# Patient Record
Sex: Female | Born: 1970
Health system: Southern US, Community
[De-identification: ages and names within clinical notes are randomized; demographics above are authoritative.]

## PROBLEM LIST (undated history)

## (undated) DIAGNOSIS — R569 Unspecified convulsions: Secondary | ICD-10-CM

## (undated) DIAGNOSIS — K219 Gastro-esophageal reflux disease without esophagitis: Secondary | ICD-10-CM

## (undated) DIAGNOSIS — F32A Depression, unspecified: Secondary | ICD-10-CM

## (undated) DIAGNOSIS — G8929 Other chronic pain: Secondary | ICD-10-CM

## (undated) DIAGNOSIS — M1990 Unspecified osteoarthritis, unspecified site: Secondary | ICD-10-CM

## (undated) DIAGNOSIS — M25511 Pain in right shoulder: Secondary | ICD-10-CM

## (undated) DIAGNOSIS — F329 Major depressive disorder, single episode, unspecified: Secondary | ICD-10-CM

## (undated) DIAGNOSIS — M542 Cervicalgia: Secondary | ICD-10-CM

## (undated) DIAGNOSIS — G4730 Sleep apnea, unspecified: Secondary | ICD-10-CM

## (undated) DIAGNOSIS — F419 Anxiety disorder, unspecified: Secondary | ICD-10-CM

## (undated) DIAGNOSIS — J3489 Other specified disorders of nose and nasal sinuses: Secondary | ICD-10-CM

## (undated) DIAGNOSIS — G43909 Migraine, unspecified, not intractable, without status migrainosus: Secondary | ICD-10-CM

## (undated) DIAGNOSIS — J45909 Unspecified asthma, uncomplicated: Secondary | ICD-10-CM

## (undated) DIAGNOSIS — M25512 Pain in left shoulder: Secondary | ICD-10-CM

## (undated) DIAGNOSIS — I639 Cerebral infarction, unspecified: Secondary | ICD-10-CM

## (undated) HISTORY — DX: Anxiety disorder, unspecified: F41.9

## (undated) HISTORY — DX: Unspecified osteoarthritis, unspecified site: M19.90

## (undated) HISTORY — DX: Unspecified asthma, uncomplicated: J45.909

## (undated) HISTORY — DX: Migraine, unspecified, not intractable, without status migrainosus: G43.909

## (undated) HISTORY — DX: Pain in right shoulder: M25.511

## (undated) HISTORY — DX: Major depressive disorder, single episode, unspecified: F32.9

## (undated) HISTORY — DX: Cervicalgia: M54.2

## (undated) HISTORY — DX: Unspecified convulsions: R56.9

## (undated) HISTORY — PX: ABDOMINAL HYSTERECTOMY: SHX81

## (undated) HISTORY — DX: Depression, unspecified: F32.A

## (undated) HISTORY — DX: Sleep apnea, unspecified: G47.30

## (undated) HISTORY — DX: Cerebral infarction, unspecified: I63.9

## (undated) HISTORY — PX: TONSILLECTOMY: SUR1361

## (undated) HISTORY — DX: Pain in left shoulder: M25.512

## (undated) HISTORY — DX: Gastro-esophageal reflux disease without esophagitis: K21.9

## (undated) HISTORY — DX: Other chronic pain: G89.29

---

## 1993-03-06 HISTORY — PX: PARTIAL HYSTERECTOMY: SHX80

## 2013-06-29 ENCOUNTER — Emergency Department: Payer: Self-pay | Admitting: Emergency Medicine

## 2013-06-30 LAB — COMPREHENSIVE METABOLIC PANEL
ANION GAP: 5 — AB (ref 7–16)
Albumin: 3.9 g/dL (ref 3.4–5.0)
Alkaline Phosphatase: 154 U/L — ABNORMAL HIGH
BUN: 8 mg/dL (ref 7–18)
Bilirubin,Total: 0.1 mg/dL — ABNORMAL LOW (ref 0.2–1.0)
CREATININE: 0.62 mg/dL (ref 0.60–1.30)
Calcium, Total: 8.9 mg/dL (ref 8.5–10.1)
Chloride: 107 mmol/L (ref 98–107)
Co2: 29 mmol/L (ref 21–32)
EGFR (African American): >60
EGFR (Non-African Amer.): >60
Glucose: 81 mg/dL (ref 65–99)
Osmolality: 279 (ref 275–301)
Potassium: 4.1 mmol/L (ref 3.5–5.1)
SGOT(AST): 21 U/L (ref 15–37)
SGPT (ALT): 16 U/L (ref 12–78)
SODIUM: 141 mmol/L (ref 136–145)
Total Protein: 7.1 g/dL (ref 6.4–8.2)

## 2013-06-30 LAB — PHENYTOIN LEVEL, TOTAL: Dilantin: 17.5 ug/mL (ref 10.0–20.0)

## 2013-06-30 LAB — URINALYSIS, COMPLETE
Bacteria: NONE SEEN
Bilirubin,UR: NEGATIVE
Blood: NEGATIVE
GLUCOSE, UR: NEGATIVE mg/dL (ref 0–75)
Ketone: NEGATIVE
LEUKOCYTE ESTERASE: NEGATIVE
NITRITE: NEGATIVE
PROTEIN: NEGATIVE
Ph: 8 (ref 4.5–8.0)
RBC,UR: NONE SEEN /HPF (ref 0–5)
SPECIFIC GRAVITY: 1.008 (ref 1.003–1.030)
Squamous Epithelial: 1
WBC UR: <1 /HPF (ref 0–5)

## 2013-06-30 LAB — DRUG SCREEN, URINE
Amphetamines, Ur Screen: NEGATIVE (ref ?–1000)
BENZODIAZEPINE, UR SCRN: POSITIVE (ref ?–200)
Barbiturates, Ur Screen: POSITIVE (ref ?–200)
Cannabinoid 50 Ng, Ur ~~LOC~~: NEGATIVE (ref ?–50)
Cocaine Metabolite,Ur ~~LOC~~: NEGATIVE (ref ?–300)
MDMA (Ecstasy)Ur Screen: NEGATIVE (ref ?–500)
Methadone, Ur Screen: NEGATIVE (ref ?–300)
Opiate, Ur Screen: NEGATIVE (ref ?–300)
PHENCYCLIDINE (PCP) UR S: NEGATIVE (ref ?–25)
Tricyclic, Ur Screen: NEGATIVE (ref ?–1000)

## 2013-06-30 LAB — CBC
HCT: 38.1 % (ref 35.0–47.0)
HGB: 13 g/dL (ref 12.0–16.0)
MCH: 30.2 pg (ref 26.0–34.0)
MCHC: 34.1 g/dL (ref 32.0–36.0)
MCV: 89 fL (ref 80–100)
Platelet: 261 10*3/uL (ref 150–440)
RBC: 4.31 10*6/uL (ref 3.80–5.20)
RDW: 12.7 % (ref 11.5–14.5)
WBC: 6.1 10*3/uL (ref 3.6–11.0)

## 2013-06-30 LAB — PHENOBARBITAL LEVEL: Phenobarbital: 15.8 ug/mL (ref 15.0–40.0)

## 2013-07-11 DIAGNOSIS — G8929 Other chronic pain: Secondary | ICD-10-CM | POA: Insufficient documentation

## 2013-08-07 ENCOUNTER — Encounter: Payer: Self-pay | Admitting: Neurology

## 2013-08-07 DIAGNOSIS — R569 Unspecified convulsions: Secondary | ICD-10-CM | POA: Insufficient documentation

## 2013-08-07 DIAGNOSIS — J449 Chronic obstructive pulmonary disease, unspecified: Secondary | ICD-10-CM

## 2013-08-07 DIAGNOSIS — G4730 Sleep apnea, unspecified: Secondary | ICD-10-CM

## 2013-08-07 DIAGNOSIS — I639 Cerebral infarction, unspecified: Secondary | ICD-10-CM

## 2013-08-07 DIAGNOSIS — J439 Emphysema, unspecified: Secondary | ICD-10-CM | POA: Insufficient documentation

## 2013-08-08 ENCOUNTER — Ambulatory Visit: Payer: Medicare Other | Admitting: Neurology

## 2013-08-16 ENCOUNTER — Emergency Department: Payer: Self-pay | Admitting: Emergency Medicine

## 2013-08-16 LAB — COMPREHENSIVE METABOLIC PANEL
ALK PHOS: 120 U/L — AB
Albumin: 3.7 g/dL (ref 3.4–5.0)
Anion Gap: 10 (ref 7–16)
BUN: 12 mg/dL (ref 7–18)
Bilirubin,Total: 0.3 mg/dL (ref 0.2–1.0)
CALCIUM: 9 mg/dL (ref 8.5–10.1)
Chloride: 109 mmol/L — ABNORMAL HIGH (ref 98–107)
Co2: 22 mmol/L (ref 21–32)
Creatinine: 0.77 mg/dL (ref 0.60–1.30)
EGFR (African American): >60
EGFR (Non-African Amer.): >60
Glucose: 92 mg/dL (ref 65–99)
Osmolality: 281 (ref 275–301)
POTASSIUM: 3.4 mmol/L — AB (ref 3.5–5.1)
SGOT(AST): 27 U/L (ref 15–37)
SGPT (ALT): 15 U/L (ref 12–78)
Sodium: 141 mmol/L (ref 136–145)
Total Protein: 7.4 g/dL (ref 6.4–8.2)

## 2013-08-16 LAB — CBC
HCT: 37.7 % (ref 35.0–47.0)
HGB: 12.8 g/dL (ref 12.0–16.0)
MCH: 29.6 pg (ref 26.0–34.0)
MCHC: 33.9 g/dL (ref 32.0–36.0)
MCV: 87 fL (ref 80–100)
PLATELETS: 290 10*3/uL (ref 150–440)
RBC: 4.32 10*6/uL (ref 3.80–5.20)
RDW: 12.6 % (ref 11.5–14.5)
WBC: 7.1 10*3/uL (ref 3.6–11.0)

## 2013-08-16 LAB — URINALYSIS, COMPLETE
Bilirubin,UR: NEGATIVE
Glucose,UR: NEGATIVE mg/dL (ref 0–75)
Ketone: NEGATIVE
LEUKOCYTE ESTERASE: NEGATIVE
Nitrite: NEGATIVE
Ph: 6 (ref 4.5–8.0)
Protein: NEGATIVE
RBC,UR: 1 /HPF (ref 0–5)
SPECIFIC GRAVITY: 1.009 (ref 1.003–1.030)
WBC UR: 1 /HPF (ref 0–5)

## 2013-08-16 LAB — PHENOBARBITAL LEVEL: Phenobarbital: <2.1 ug/mL — ABNORMAL LOW (ref 15.0–40.0)

## 2013-08-16 LAB — TROPONIN I: Troponin-I: <0.02 ng/mL

## 2013-08-16 LAB — PRO B NATRIURETIC PEPTIDE: B-Type Natriuretic Peptide: 86 pg/mL (ref 0–125)

## 2013-08-16 LAB — PHENYTOIN LEVEL, TOTAL: Dilantin: 0.6 ug/mL — ABNORMAL LOW (ref 10.0–20.0)

## 2013-09-02 ENCOUNTER — Emergency Department: Payer: Self-pay | Admitting: Emergency Medicine

## 2013-10-17 ENCOUNTER — Emergency Department: Payer: Self-pay | Admitting: Emergency Medicine

## 2013-10-17 LAB — DRUG SCREEN, URINE
Amphetamines, Ur Screen: NEGATIVE (ref ?–1000)
Barbiturates, Ur Screen: NEGATIVE (ref ?–200)
Benzodiazepine, Ur Scrn: POSITIVE (ref ?–200)
COCAINE METABOLITE, UR ~~LOC~~: NEGATIVE (ref ?–300)
Cannabinoid 50 Ng, Ur ~~LOC~~: NEGATIVE (ref ?–50)
MDMA (Ecstasy)Ur Screen: NEGATIVE (ref ?–500)
Methadone, Ur Screen: NEGATIVE (ref ?–300)
Opiate, Ur Screen: NEGATIVE (ref ?–300)
PHENCYCLIDINE (PCP) UR S: NEGATIVE (ref ?–25)
Tricyclic, Ur Screen: NEGATIVE (ref ?–1000)

## 2013-10-17 LAB — URINALYSIS, COMPLETE
BACTERIA: NONE SEEN
BLOOD: NEGATIVE
Bilirubin,UR: NEGATIVE
Glucose,UR: NEGATIVE mg/dL (ref 0–75)
Ketone: NEGATIVE
LEUKOCYTE ESTERASE: NEGATIVE
Nitrite: NEGATIVE
PH: 8 (ref 4.5–8.0)
PROTEIN: NEGATIVE
RBC,UR: 1 /HPF (ref 0–5)
Specific Gravity: 1.006 (ref 1.003–1.030)
Squamous Epithelial: 3
WBC UR: 2 /HPF (ref 0–5)

## 2013-10-17 LAB — BASIC METABOLIC PANEL
Anion Gap: 8 (ref 7–16)
BUN: 9 mg/dL (ref 7–18)
CREATININE: 0.79 mg/dL (ref 0.60–1.30)
Calcium, Total: 8.8 mg/dL (ref 8.5–10.1)
Chloride: 109 mmol/L — ABNORMAL HIGH (ref 98–107)
Co2: 22 mmol/L (ref 21–32)
EGFR (Non-African Amer.): >60
Glucose: 94 mg/dL (ref 65–99)
OSMOLALITY: 276 (ref 275–301)
POTASSIUM: 3.7 mmol/L (ref 3.5–5.1)
Sodium: 139 mmol/L (ref 136–145)

## 2013-10-17 LAB — PRO B NATRIURETIC PEPTIDE: B-Type Natriuretic Peptide: 13 pg/mL (ref 0–125)

## 2013-10-17 LAB — CBC
HCT: 40.2 % (ref 35.0–47.0)
HGB: 13.2 g/dL (ref 12.0–16.0)
MCH: 29.2 pg (ref 26.0–34.0)
MCHC: 32.7 g/dL (ref 32.0–36.0)
MCV: 89 fL (ref 80–100)
PLATELETS: 266 10*3/uL (ref 150–440)
RBC: 4.51 10*6/uL (ref 3.80–5.20)
RDW: 12.7 % (ref 11.5–14.5)
WBC: 9.2 10*3/uL (ref 3.6–11.0)

## 2013-10-29 DIAGNOSIS — M545 Low back pain, unspecified: Secondary | ICD-10-CM | POA: Insufficient documentation

## 2013-10-29 DIAGNOSIS — G40909 Epilepsy, unspecified, not intractable, without status epilepticus: Secondary | ICD-10-CM | POA: Insufficient documentation

## 2013-10-29 DIAGNOSIS — R221 Localized swelling, mass and lump, neck: Secondary | ICD-10-CM | POA: Insufficient documentation

## 2013-11-08 LAB — CBC WITH DIFFERENTIAL/PLATELET
BASOS PCT: 0.8 %
Basophil #: 0.1 10*3/uL (ref 0.0–0.1)
EOS ABS: 0.2 10*3/uL (ref 0.0–0.7)
Eosinophil %: 1.7 %
HCT: 37.1 % (ref 35.0–47.0)
HGB: 12.8 g/dL (ref 12.0–16.0)
LYMPHS PCT: 24.9 %
Lymphocyte #: 2.5 10*3/uL (ref 1.0–3.6)
MCH: 30.2 pg (ref 26.0–34.0)
MCHC: 34.7 g/dL (ref 32.0–36.0)
MCV: 87 fL (ref 80–100)
MONO ABS: 0.8 x10 3/mm (ref 0.2–0.9)
Monocyte %: 8.2 %
NEUTROS ABS: 6.6 10*3/uL — AB (ref 1.4–6.5)
NEUTROS PCT: 64.4 %
Platelet: 296 10*3/uL (ref 150–440)
RBC: 4.25 10*6/uL (ref 3.80–5.20)
RDW: 12.7 % (ref 11.5–14.5)
WBC: 10.2 10*3/uL (ref 3.6–11.0)

## 2013-11-08 LAB — COMPREHENSIVE METABOLIC PANEL
ALBUMIN: 3.3 g/dL — AB (ref 3.4–5.0)
ALT: 31 U/L
Alkaline Phosphatase: 154 U/L — ABNORMAL HIGH
Anion Gap: 11 (ref 7–16)
BILIRUBIN TOTAL: 0.2 mg/dL (ref 0.2–1.0)
BUN: 14 mg/dL (ref 7–18)
CALCIUM: 7.8 mg/dL — AB (ref 8.5–10.1)
CHLORIDE: 110 mmol/L — AB (ref 98–107)
CREATININE: 0.77 mg/dL (ref 0.60–1.30)
Co2: 20 mmol/L — ABNORMAL LOW (ref 21–32)
GLUCOSE: 115 mg/dL — AB (ref 65–99)
OSMOLALITY: 283 (ref 275–301)
Potassium: 3.5 mmol/L (ref 3.5–5.1)
SGOT(AST): 36 U/L (ref 15–37)
Sodium: 141 mmol/L (ref 136–145)
TOTAL PROTEIN: 7 g/dL (ref 6.4–8.2)

## 2013-11-09 ENCOUNTER — Ambulatory Visit: Payer: Self-pay | Admitting: Neurology

## 2013-11-09 ENCOUNTER — Observation Stay: Payer: Self-pay | Admitting: Internal Medicine

## 2013-11-09 LAB — URINALYSIS, COMPLETE
Bilirubin,UR: NEGATIVE
Blood: NEGATIVE
GLUCOSE, UR: NEGATIVE mg/dL (ref 0–75)
Ketone: NEGATIVE
LEUKOCYTE ESTERASE: NEGATIVE
Nitrite: NEGATIVE
Ph: 7 (ref 4.5–8.0)
Protein: NEGATIVE
RBC,UR: 1 /HPF (ref 0–5)
Specific Gravity: 1.008 (ref 1.003–1.030)
Squamous Epithelial: 1
WBC UR: 3 /HPF (ref 0–5)

## 2013-11-10 LAB — BASIC METABOLIC PANEL
Anion Gap: 7 (ref 7–16)
BUN: 13 mg/dL (ref 7–18)
Calcium, Total: 8.6 mg/dL (ref 8.5–10.1)
Chloride: 110 mmol/L — ABNORMAL HIGH (ref 98–107)
Co2: 23 mmol/L (ref 21–32)
Creatinine: 0.83 mg/dL (ref 0.60–1.30)
EGFR (African American): >60
EGFR (Non-African Amer.): >60
Glucose: 106 mg/dL — ABNORMAL HIGH (ref 65–99)
Osmolality: 280 (ref 275–301)
Potassium: 3.8 mmol/L (ref 3.5–5.1)
Sodium: 140 mmol/L (ref 136–145)

## 2013-11-10 LAB — CBC WITH DIFFERENTIAL/PLATELET
Basophil #: 0.1 10*3/uL (ref 0.0–0.1)
Basophil %: 0.7 %
Eosinophil #: 0.2 10*3/uL (ref 0.0–0.7)
Eosinophil %: 2.9 %
HCT: 36.2 % (ref 35.0–47.0)
HGB: 11.8 g/dL — ABNORMAL LOW (ref 12.0–16.0)
Lymphocyte #: 2.3 10*3/uL (ref 1.0–3.6)
Lymphocyte %: 31.3 %
MCH: 28.9 pg (ref 26.0–34.0)
MCHC: 32.6 g/dL (ref 32.0–36.0)
MCV: 89 fL (ref 80–100)
Monocyte #: 0.6 x10 3/mm (ref 0.2–0.9)
Monocyte %: 7.9 %
Neutrophil #: 4.3 10*3/uL (ref 1.4–6.5)
Neutrophil %: 57.2 %
Platelet: 246 10*3/uL (ref 150–440)
RBC: 4.08 10*6/uL (ref 3.80–5.20)
RDW: 12.6 % (ref 11.5–14.5)
WBC: 7.5 10*3/uL (ref 3.6–11.0)

## 2013-11-10 LAB — TSH: Thyroid Stimulating Horm: 1.32 u[IU]/mL

## 2013-11-12 LAB — COMPREHENSIVE METABOLIC PANEL
ALBUMIN: 3.1 g/dL — AB (ref 3.4–5.0)
ALK PHOS: 149 U/L — AB
Anion Gap: 10 (ref 7–16)
BUN: 15 mg/dL (ref 7–18)
Bilirubin,Total: 0.1 mg/dL — ABNORMAL LOW (ref 0.2–1.0)
CHLORIDE: 108 mmol/L — AB (ref 98–107)
CREATININE: 0.82 mg/dL (ref 0.60–1.30)
Calcium, Total: 8.4 mg/dL — ABNORMAL LOW (ref 8.5–10.1)
Co2: 20 mmol/L — ABNORMAL LOW (ref 21–32)
EGFR (African American): >60
GLUCOSE: 141 mg/dL — AB (ref 65–99)
Osmolality: 279 (ref 275–301)
POTASSIUM: 3.6 mmol/L (ref 3.5–5.1)
SGOT(AST): 37 U/L (ref 15–37)
SGPT (ALT): 45 U/L
SODIUM: 138 mmol/L (ref 136–145)
Total Protein: 6.4 g/dL (ref 6.4–8.2)

## 2013-11-12 LAB — CBC WITH DIFFERENTIAL/PLATELET
BASOS ABS: 0.1 10*3/uL (ref 0.0–0.1)
Basophil %: 0.7 %
EOS PCT: 3.2 %
Eosinophil #: 0.3 10*3/uL (ref 0.0–0.7)
HCT: 35.6 % (ref 35.0–47.0)
HGB: 12 g/dL (ref 12.0–16.0)
LYMPHS PCT: 25.1 %
Lymphocyte #: 2.2 10*3/uL (ref 1.0–3.6)
MCH: 29.6 pg (ref 26.0–34.0)
MCHC: 33.8 g/dL (ref 32.0–36.0)
MCV: 88 fL (ref 80–100)
MONO ABS: 0.7 x10 3/mm (ref 0.2–0.9)
Monocyte %: 8.2 %
Neutrophil #: 5.5 10*3/uL (ref 1.4–6.5)
Neutrophil %: 62.8 %
Platelet: 252 10*3/uL (ref 150–440)
RBC: 4.06 10*6/uL (ref 3.80–5.20)
RDW: 12.3 % (ref 11.5–14.5)
WBC: 8.7 10*3/uL (ref 3.6–11.0)

## 2013-11-17 ENCOUNTER — Emergency Department: Payer: Self-pay | Admitting: Emergency Medicine

## 2013-11-17 LAB — CBC
HCT: 33.7 % — AB (ref 35.0–47.0)
HGB: 11.2 g/dL — ABNORMAL LOW (ref 12.0–16.0)
MCH: 29.3 pg (ref 26.0–34.0)
MCHC: 33.1 g/dL (ref 32.0–36.0)
MCV: 89 fL (ref 80–100)
Platelet: 246 10*3/uL (ref 150–440)
RBC: 3.8 10*6/uL (ref 3.80–5.20)
RDW: 12.8 % (ref 11.5–14.5)
WBC: 6.7 10*3/uL (ref 3.6–11.0)

## 2013-11-17 LAB — COMPREHENSIVE METABOLIC PANEL
ALBUMIN: 2.9 g/dL — AB (ref 3.4–5.0)
ANION GAP: 9 (ref 7–16)
AST: 30 U/L (ref 15–37)
Alkaline Phosphatase: 142 U/L — ABNORMAL HIGH
BUN: 11 mg/dL (ref 7–18)
Bilirubin,Total: 0.1 mg/dL — ABNORMAL LOW (ref 0.2–1.0)
CALCIUM: 8.1 mg/dL — AB (ref 8.5–10.1)
Chloride: 111 mmol/L — ABNORMAL HIGH (ref 98–107)
Co2: 22 mmol/L (ref 21–32)
Creatinine: 0.67 mg/dL (ref 0.60–1.30)
EGFR (African American): >60
EGFR (Non-African Amer.): >60
GLUCOSE: 90 mg/dL (ref 65–99)
Osmolality: 282 (ref 275–301)
POTASSIUM: 4.1 mmol/L (ref 3.5–5.1)
SGPT (ALT): 28 U/L
Sodium: 142 mmol/L (ref 136–145)
Total Protein: 6.6 g/dL (ref 6.4–8.2)

## 2013-11-17 LAB — HCG, QUANTITATIVE, PREGNANCY: Beta Hcg, Quant.: 1 m[IU]/mL

## 2013-11-17 LAB — TROPONIN I

## 2013-11-27 DIAGNOSIS — IMO0002 Reserved for concepts with insufficient information to code with codable children: Secondary | ICD-10-CM | POA: Insufficient documentation

## 2013-12-02 DIAGNOSIS — R519 Headache, unspecified: Secondary | ICD-10-CM | POA: Insufficient documentation

## 2013-12-02 DIAGNOSIS — R51 Headache: Secondary | ICD-10-CM

## 2013-12-25 ENCOUNTER — Ambulatory Visit: Payer: Self-pay | Admitting: Pain Medicine

## 2014-01-07 DIAGNOSIS — F419 Anxiety disorder, unspecified: Secondary | ICD-10-CM | POA: Insufficient documentation

## 2014-01-07 DIAGNOSIS — F32A Depression, unspecified: Secondary | ICD-10-CM | POA: Insufficient documentation

## 2014-01-07 DIAGNOSIS — F329 Major depressive disorder, single episode, unspecified: Secondary | ICD-10-CM | POA: Insufficient documentation

## 2014-01-13 ENCOUNTER — Ambulatory Visit: Payer: Self-pay | Admitting: Nurse Practitioner

## 2014-04-13 DIAGNOSIS — IMO0001 Reserved for inherently not codable concepts without codable children: Secondary | ICD-10-CM | POA: Insufficient documentation

## 2014-04-13 DIAGNOSIS — R51 Headache: Secondary | ICD-10-CM | POA: Diagnosis not present

## 2014-04-13 DIAGNOSIS — R6889 Other general symptoms and signs: Secondary | ICD-10-CM | POA: Diagnosis not present

## 2014-04-13 DIAGNOSIS — R519 Headache, unspecified: Secondary | ICD-10-CM | POA: Insufficient documentation

## 2014-05-14 DIAGNOSIS — F329 Major depressive disorder, single episode, unspecified: Secondary | ICD-10-CM | POA: Insufficient documentation

## 2014-05-14 DIAGNOSIS — F32A Depression, unspecified: Secondary | ICD-10-CM | POA: Insufficient documentation

## 2014-05-14 DIAGNOSIS — R6889 Other general symptoms and signs: Secondary | ICD-10-CM | POA: Diagnosis not present

## 2014-05-14 DIAGNOSIS — R51 Headache: Secondary | ICD-10-CM | POA: Diagnosis not present

## 2014-05-15 ENCOUNTER — Encounter (HOSPITAL_COMMUNITY): Payer: Self-pay | Admitting: *Deleted

## 2014-05-15 ENCOUNTER — Inpatient Hospital Stay (HOSPITAL_COMMUNITY)
Admission: AD | Admit: 2014-05-15 | Discharge: 2014-05-17 | DRG: 100 | Disposition: A | Discharge status: Home / Self Care | Payer: Commercial Managed Care - HMO | Source: Other Acute Inpatient Hospital | Attending: Neurology | Admitting: Neurology

## 2014-05-15 ENCOUNTER — Emergency Department: Payer: Self-pay | Admitting: Emergency Medicine

## 2014-05-15 ENCOUNTER — Inpatient Hospital Stay (HOSPITAL_COMMUNITY): Payer: Commercial Managed Care - HMO

## 2014-05-15 DIAGNOSIS — G40901 Epilepsy, unspecified, not intractable, with status epilepticus: Secondary | ICD-10-CM | POA: Diagnosis not present

## 2014-05-15 DIAGNOSIS — G4730 Sleep apnea, unspecified: Secondary | ICD-10-CM | POA: Diagnosis present

## 2014-05-15 DIAGNOSIS — Z880 Allergy status to penicillin: Secondary | ICD-10-CM | POA: Diagnosis not present

## 2014-05-15 DIAGNOSIS — R569 Unspecified convulsions: Secondary | ICD-10-CM | POA: Diagnosis not present

## 2014-05-15 DIAGNOSIS — F329 Major depressive disorder, single episode, unspecified: Secondary | ICD-10-CM | POA: Diagnosis present

## 2014-05-15 DIAGNOSIS — Z7982 Long term (current) use of aspirin: Secondary | ICD-10-CM | POA: Diagnosis not present

## 2014-05-15 DIAGNOSIS — J9600 Acute respiratory failure, unspecified whether with hypoxia or hypercapnia: Secondary | ICD-10-CM | POA: Diagnosis present

## 2014-05-15 DIAGNOSIS — Z79899 Other long term (current) drug therapy: Secondary | ICD-10-CM | POA: Diagnosis not present

## 2014-05-15 DIAGNOSIS — F419 Anxiety disorder, unspecified: Secondary | ICD-10-CM | POA: Diagnosis not present

## 2014-05-15 DIAGNOSIS — G40301 Generalized idiopathic epilepsy and epileptic syndromes, not intractable, with status epilepticus: Secondary | ICD-10-CM

## 2014-05-15 DIAGNOSIS — Z0189 Encounter for other specified special examinations: Secondary | ICD-10-CM

## 2014-05-15 DIAGNOSIS — R401 Stupor: Secondary | ICD-10-CM | POA: Diagnosis not present

## 2014-05-15 DIAGNOSIS — J449 Chronic obstructive pulmonary disease, unspecified: Secondary | ICD-10-CM | POA: Diagnosis present

## 2014-05-15 DIAGNOSIS — Z791 Long term (current) use of non-steroidal anti-inflammatories (NSAID): Secondary | ICD-10-CM | POA: Diagnosis not present

## 2014-05-15 DIAGNOSIS — M5032 Other cervical disc degeneration, mid-cervical region: Secondary | ICD-10-CM | POA: Diagnosis not present

## 2014-05-15 DIAGNOSIS — Z8673 Personal history of transient ischemic attack (TIA), and cerebral infarction without residual deficits: Secondary | ICD-10-CM | POA: Diagnosis not present

## 2014-05-15 DIAGNOSIS — Z7951 Long term (current) use of inhaled steroids: Secondary | ICD-10-CM | POA: Diagnosis not present

## 2014-05-15 DIAGNOSIS — J9601 Acute respiratory failure with hypoxia: Secondary | ICD-10-CM

## 2014-05-15 DIAGNOSIS — Z720 Tobacco use: Secondary | ICD-10-CM | POA: Diagnosis not present

## 2014-05-15 DIAGNOSIS — S0990XA Unspecified injury of head, initial encounter: Secondary | ICD-10-CM | POA: Diagnosis not present

## 2014-05-15 DIAGNOSIS — Z4682 Encounter for fitting and adjustment of non-vascular catheter: Secondary | ICD-10-CM | POA: Diagnosis not present

## 2014-05-15 LAB — RAPID URINE DRUG SCREEN, HOSP PERFORMED
AMPHETAMINES: NOT DETECTED
BARBITURATES: NOT DETECTED
Benzodiazepines: POSITIVE — AB
COCAINE: NOT DETECTED
Opiates: NOT DETECTED
Tetrahydrocannabinol: NOT DETECTED

## 2014-05-15 LAB — MRSA PCR SCREENING: MRSA BY PCR: NEGATIVE

## 2014-05-15 LAB — TRIGLYCERIDES: TRIGLYCERIDES: 194 mg/dL — AB (ref <150)

## 2014-05-15 MED ORDER — PROPOFOL 10 MG/ML IV EMUL
0.0000 ug/kg/min | INTRAVENOUS | Status: DC
  Administered 2014-05-15: 30 ug/kg/min via INTRAVENOUS
  Administered 2014-05-15: 40 ug/kg/min via INTRAVENOUS
  Administered 2014-05-16: 50 ug/kg/min via INTRAVENOUS
  Administered 2014-05-16: 45 ug/kg/min via INTRAVENOUS
  Filled 2014-05-15 (×4): qty 100

## 2014-05-15 MED ORDER — PANTOPRAZOLE SODIUM 40 MG IV SOLR
40.0000 mg | Freq: Every day | INTRAVENOUS | Status: DC
  Administered 2014-05-15: 40 mg via INTRAVENOUS
  Filled 2014-05-15 (×2): qty 40

## 2014-05-15 MED ORDER — LORAZEPAM 2 MG/ML IJ SOLN: 2.0000 mg | INTRAMUSCULAR | Status: DC | PRN

## 2014-05-15 MED ORDER — CETYLPYRIDINIUM CHLORIDE 0.05 % MT LIQD
7.0000 mL | Freq: Four times a day (QID) | OROMUCOSAL | Status: DC
  Administered 2014-05-16 (×2): 7 mL via OROMUCOSAL

## 2014-05-15 MED ORDER — LORAZEPAM 2 MG/ML IJ SOLN
INTRAMUSCULAR | Status: AC
Start: 2014-05-15 — End: 2014-05-15
  Administered 2014-05-15: 1 mg
  Filled 2014-05-15: qty 1

## 2014-05-15 MED ORDER — PROPOFOL 10 MG/ML IV EMUL
0.0000 ug/kg/min | INTRAVENOUS | Status: DC
  Administered 2014-05-15: 30 ug/kg/min via INTRAVENOUS

## 2014-05-15 MED ORDER — LACOSAMIDE 200 MG/20ML IV SOLN
100.0000 mg | Freq: Two times a day (BID) | INTRAVENOUS | Status: DC
  Administered 2014-05-16 – 2014-05-17 (×3): 100 mg via INTRAVENOUS
  Filled 2014-05-15 (×7): qty 10

## 2014-05-15 MED ORDER — LEVETIRACETAM IN NACL 750 MG/50ML IV SOLN
750.0000 mg | Freq: Two times a day (BID) | INTRAVENOUS | Status: DC
  Administered 2014-05-15 – 2014-05-17 (×4): 750 mg via INTRAVENOUS
  Filled 2014-05-15 (×7): qty 50

## 2014-05-15 MED ORDER — CHLORHEXIDINE GLUCONATE 0.12 % MT SOLN
15.0000 mL | Freq: Two times a day (BID) | OROMUCOSAL | Status: DC
Start: 2014-05-15 — End: 2014-05-16
  Administered 2014-05-15: 15 mL via OROMUCOSAL
  Filled 2014-05-15: qty 15

## 2014-05-15 MED ORDER — FENTANYL CITRATE 0.05 MG/ML IJ SOLN: 50.0000 ug | INTRAMUSCULAR | Status: DC | PRN

## 2014-05-15 NOTE — Consult Note (Signed)
PULMONARY / CRITICAL CARE MEDICINE   Name: [name] MRN: [id number] DOB: 1970-09-02    ADMISSION DATE:  05/15/2014 CONSULTATION DATE:  3/11  REFERRING MD : Donzetta Kohut, Neuro  CHIEF COMPLAINT:  seizures  INITIAL PRESENTATION: 44 year old with known seizure disorder, transferred from [facility] ED where she presented with multiple seizures requiring intubation. There was some concern that this could be psychogenic seizures. She was given Ativan, loaded with Vimpat, intubated and transferred She was awake and responsive on arrival to 3100, but then had shaking of both upper extremities suggesting seizure, hence was sedated with propofol  STUDIES:  3/11 head CT negative 3/11 CT cervical spine negative, showed parathyroid adenoma  SIGNIFICANT EVENTS:     PAST MEDICAL HISTORY :   has a past medical history of Anxiety; Arthritis; Asthma; Migraines; Depression; Emphysema/COPD; Sleep apnea; Stroke; and Seizure.  has past surgical history that includes Cesarean section (1993) and Partial hysterectomy (1995). Prior to Admission medications   Medication Sig Start Date End Date Taking? Authorizing Provider  albuterol (PROVENTIL) (2.5 MG/3ML) 0.083% nebulizer solution Take 2.5 mg by nebulization every 6 (six) hours as needed for wheezing or shortness of breath.    Historical Provider, MD  butalbital-acetaminophen-caffeine (FIORICET WITH CODEINE) 50-325-40-30 MG per capsule Take 1 capsule by mouth every 4 (four) hours as needed for headache.    Historical Provider, MD  diazepam (VALIUM) 10 MG tablet Take 10 mg by mouth every 6 (six) hours as needed for anxiety.    Historical Provider, MD  FLUoxetine (PROZAC) 40 MG capsule Take 40 mg by mouth daily.    Historical Provider, MD  Gabapentin, PHN, 300 MG TABS Take 300 mg by mouth daily.    Historical Provider, MD  Gabapentin, PHN, 600 MG TABS Take by mouth daily.    Historical Provider, MD  meloxicam (MOBIC) 7.5 MG tablet Take 7.5 mg by mouth 2  (two) times daily.    Historical Provider, MD  phenazopyridine (PYRIDIUM) 100 MG tablet Take 100 mg by mouth 3 (three) times daily as needed for pain.    Historical Provider, MD  PHENobarbital (LUMINAL) 32.4 MG tablet Take 32.4 mg by mouth at bedtime.    Historical Provider, MD  phenytoin (DILANTIN) 100 MG ER capsule Take 100 mg by mouth 3 (three) times daily.    Historical Provider, MD  promethazine (PHENERGAN) 25 MG tablet Take 25 mg by mouth every 6 (six) hours as needed for nausea or vomiting.    Historical Provider, MD   Allergies  Allergen Reactions  . Contrast Media [Iodinated Diagnostic Agents]   . Penicillins     FAMILY HISTORY:  has no family status information on file.  SOCIAL HISTORY:  reports that she has never smoked. She has never used smokeless tobacco. She reports that she does not drink alcohol or use illicit drugs.  REVIEW OF SYSTEMS:  Unable to obtain  SUBJECTIVE:   VITAL SIGNS: FiO2 (%):  [30 %] 30 % (03/11 1700) HEMODYNAMICS:   VENTILATOR SETTINGS: Vent Mode:  [-] PRVC FiO2 (%):  [30 %] 30 % Set Rate:  [14 bmp] 14 bmp Vt Set:  [550 mL] 550 mL PEEP:  [5 cmH20] 5 cmH20 Plateau Pressure:  [15 cmH20] 15 cmH20 INTAKE / OUTPUT: No intake or output data in the 24 hours ending 05/15/14 1752  PHYSICAL EXAMINATION: General:  Acutely ill, sedated on propofol Neuro:  Sedated, unresponsive, pupils 3 mm reactive to light HEENT:  No JVD Cardiovascular:  S1 and S2 tachycardia Lungs:  Clear Abdomen:  Soft and nontender Musculoskeletal:  No edema Skin:  Intact  LABS: From [place] reviewed  CBC No results for input(s): WBC, HGB, HCT, PLT in the last 168 hours. Coag's No results for input(s): APTT, INR in the last 168 hours. BMET No results for input(s): NA, K, CL, CO2, BUN, CREATININE, GLUCOSE in the last 168 hours. Electrolytes No results for input(s): CALCIUM, MG, PHOS in the last 168 hours. Sepsis Markers No results for input(s): LATICACIDVEN,  PROCALCITON, O2SATVEN in the last 168 hours. ABG No results for input(s): PHART, PCO2ART, PO2ART in the last 168 hours. Liver Enzymes No results for input(s): AST, ALT, ALKPHOS, BILITOT, ALBUMIN in the last 168 hours. Cardiac Enzymes No results for input(s): TROPONINI, PROBNP in the last 168 hours. Glucose No results for input(s): GLUCAP in the last 168 hours.  Imaging No results found.   ASSESSMENT / PLAN:  PULMONARY OETT 3/11>> A: Acute respiratory failure, intubated for airway protection P:   Vent bundle SBTs with goal of extubation  CARDIOVASCULAR  A: no issues P:    RENAL A:  No issues P:   NS @ 50/h  GASTROINTESTINAL A:  No issues P:   protonix for SUP  HEMATOLOGIC A:  No issues P:    ENDOCRINE A:  No issues   P:   CBG while npo  NEUROLOGIC A:  Status epilepticus vs psychogenic seizures P:   RASS goal: 0 Propofol gtt/ fent prn Await eeg, if neg-can proceed with extubation in am   FAMILY  - Updates: husband 3/11  - Inter-disciplinary family meet or Palliative Care meeting due by: NA    TODAY'S SUMMARY: Status vs psychogenic seizures, await EEG  Care during the described time interval was provided by me and/or other providers on the critical care team.  I have reviewed this patient's available data, including medical history, events of note, physical examination and test results as part of my evaluation  CC time x 84m   Laquesha Holcomb V. MD  05/15/2014, 5:52 PM

## 2014-05-15 NOTE — Consult Note (Signed)
NEURO HOSPITALIST CONSULT NOTE    Reason for Consult: status epilepticus  HPI:                                                                                                                                          [name] is an 44 y.o. female with a past medical history significant for migraines, depression, anxiety, stroke, and seizures, transferred to [facility] for further evaluation and management of status epilepticus. Patient reportedly had multiple seizures that were not aborted with benzodiazepines, was loaded with vimpat, intubated and transferred to [facility]. CT brain performed at [facility] showed no acute abnormality. She is now intubated on propofol, but with open eyes and able to follow commands. As per ED attending, the pattern of witnessed seizures were concerning for non epileptic seizures. When I first saw patient the NICU, she was alert and awake and we considered extubation when suddenly she started jerking movement of her arms with decreased responsiveness.   Past Medical History  Diagnosis Date  . Anxiety   . Arthritis   . Asthma   . Migraines   . Depression   . Emphysema/COPD   . Sleep apnea   . Stroke   . Seizure     Past Surgical History  Procedure Laterality Date  . Cesarean section  1993  . Partial hysterectomy  1995    No family history on file.  Family History: unable to obtain due to mental status   Social History:  reports that she has never smoked. She has never used smokeless tobacco. She reports that she does not drink alcohol or use illicit drugs.  Allergies  Allergen Reactions  . Contrast Media [Iodinated Diagnostic Agents]   . Penicillins     MEDICATIONS:                                                                                                                     Scheduled: . [START ON 05/16/2014] lacosamide (VIMPAT) IV  100 mg Intravenous Q12H  . levETIRAcetam  750 mg Intravenous Q12H  . pantoprazole  (PROTONIX) IV  40 mg Intravenous Q24H     ROS: unable to obtain due to mental status  History obtained from chart review    There were no vitals taken for this visit.    Physical exam: sedated with propofol, intubated on the vent Head: normocephalic. Neck: supple, no bruits, no JVD. Cardiac: no murmurs. Lungs: clear. Abdomen: soft, no tender, no mass. Extremities: no edema. Skin: no rash Neurologic Examination:                                                                                                      General: Mental Status: She is sedated with propofol, intubated on the vent, but initially was able to follow commands. Cranial Nerves: II: Discs flat bilaterally; Visual fields grossly normal, pupils equal, round, reactive to light III,IV, VI: ptosis not present, extra-ocular motions intact bilaterally V,VII: smile symmetric, facial light touch sensation normal bilaterally VIII: hearing normal bilaterally IX,X: uvula rises symmetrically XI: bilateral shoulder shrug no tested XII: midline tongue extension without atrophy or fasciculations Motor: Right : Upper extremity   5/5    Left:     Upper extremity   5/5  Lower extremity   5/5     Lower extremity   5/5 Tone and bulk:normal tone throughout; no atrophy noted Sensory: reacts to pain Deep Tendon Reflexes:  Right: Upper Extremity   Left: Upper extremity   biceps (C-5 to C-6) 2/4   biceps (C-5 to C-6) 2/4 tricep (C7) 2/4    triceps (C7) 2/4 Brachioradialis (C6) 2/4  Brachioradialis (C6) 2/4  Lower Extremity Lower Extremity  quadriceps (L-2 to L-4) 2/4   quadriceps (L-2 to L-4) 2/4 Achilles (S1) 2/4   Achilles (S1) 2/4  Plantars: Right: downgoing   Left: downgoing Cerebellar: Unable to test Gait:  Unable to test    No results found for: CHOL  No results found for this or  any previous visit (from the past 40 hour(s)).  No results found.  Assessment/Plan: 44 y/o with  migraines, depression, anxiety, stroke, and seizures, transferred to [facility] for further evaluation and management of GTC status epilepticus.  Patient transferred to [facility] with concerns of psychogenic status. Will continue vimpat and Keppra. EEG. Will continue to follow.  Dorian Pod, MD 05/15/2014, 5:47 PM

## 2014-05-15 NOTE — Progress Notes (Signed)
Continuous EEG started.

## 2014-05-16 ENCOUNTER — Inpatient Hospital Stay (HOSPITAL_COMMUNITY): Payer: Commercial Managed Care - HMO

## 2014-05-16 DIAGNOSIS — J9600 Acute respiratory failure, unspecified whether with hypoxia or hypercapnia: Secondary | ICD-10-CM | POA: Diagnosis present

## 2014-05-16 MED ORDER — ONDANSETRON HCL 4 MG/2ML IJ SOLN
4.0000 mg | Freq: Once | INTRAMUSCULAR | Status: AC
  Administered 2014-05-16: 4 mg via INTRAVENOUS
  Filled 2014-05-16: qty 2

## 2014-05-16 MED ORDER — KCL IN DEXTROSE-NACL 20-5-0.45 MEQ/L-%-% IV SOLN
INTRAVENOUS | Status: DC
  Administered 2014-05-16: 50 mL/h via INTRAVENOUS
  Administered 2014-05-17: 06:00:00 via INTRAVENOUS
  Filled 2014-05-16 (×3): qty 1000

## 2014-05-16 MED ORDER — KETOROLAC TROMETHAMINE 30 MG/ML IJ SOLN
30.0000 mg | Freq: Four times a day (QID) | INTRAMUSCULAR | Status: DC | PRN
  Administered 2014-05-16 – 2014-05-17 (×2): 30 mg via INTRAVENOUS
  Filled 2014-05-16 (×2): qty 1

## 2014-05-16 MED ORDER — PROMETHAZINE HCL 25 MG/ML IJ SOLN
12.5000 mg | Freq: Once | INTRAMUSCULAR | Status: AC
  Administered 2014-05-16: 12.5 mg via INTRAVENOUS
  Filled 2014-05-16: qty 1

## 2014-05-16 MED ORDER — KETOROLAC TROMETHAMINE 30 MG/ML IJ SOLN
30.0000 mg | Freq: Once | INTRAMUSCULAR | Status: AC
  Administered 2014-05-16: 30 mg via INTRAVENOUS
  Filled 2014-05-16: qty 1

## 2014-05-16 MED ORDER — ACETAMINOPHEN 325 MG PO TABS
650.0000 mg | ORAL_TABLET | ORAL | Status: DC | PRN
  Administered 2014-05-16: 650 mg via ORAL
  Filled 2014-05-16: qty 2

## 2014-05-16 MED ORDER — METHOCARBAMOL 500 MG PO TABS: 500.0000 mg | ORAL_TABLET | Freq: Four times a day (QID) | ORAL | Status: DC | PRN

## 2014-05-16 NOTE — Procedures (Signed)
  Electroencephalogram report- LTM   Ordering Physician : Dr.Camilo  EEG number: [id number]    Data acquisition: 10-20 electrode placement.  Additional T1, T2, and EKG electrodes; 26 channel digital referential acquisition reformatted to 18 channel/7 channel coronal bipolar      Beginning time: 05/15/14 Ending time:05/16/14  Day of study: day 1    This 24 hours of intensive EEG monitoring with simultaneous video monitoring was performed for this patient with spells of shaking suspected seizures , she is now intubated, sedated with propofol Medications: propofol , vimpat, keppra  There was no pushbutton activations events during this recording noted on EEG tech comments    The  background activities were marked by continuous background activities throughout the recording. During wakefulness broadly distributed 8-9 cps rhythm was present with posterior dominance. Unable to comment on reactivity as formal testing were not performed.  Occasional busts of broadly distributed  theta slowing were also present  There was sleep wake cycle present marked by state changes on EEG.  During sleep the background activities were marked by alternating theta and runs of delta slowing  There was no epileptiform discharges clinical or subclinical seizures present.    Clinical interpretation: This 24 hours of intensive EEG monitoring with simultaneous monitoring did not record any clinical or subclinical seizures.  Background activities were marked by continuous background activities with state changes. Mild disorganization and intrusions of theta slowing into waking background activities might be related sedation. There was no epileptiform discharges clinical or subclinical seizures present.  Clinical correlation is advised .

## 2014-05-16 NOTE — Progress Notes (Signed)
NEURO HOSPITALIST PROGRESS NOTE   SUBJECTIVE:                                                                                                                        Patient hasn't had further seizures since admitted to the NICU. Intubated on the vent, off propofol for the past 20 minutes. Did not have seizures last night but during this assessment she sustained a 15 seconds episode characterize by tonic clonic movements upper extremities with eyes close and unresponsiveness but without concomitant EEG correlate. As a matter of fact, c-EEG monitoring since admission hasn't revealed frank epileptiform discharges or post ictal slowing despite multiple seizures few hours before transferred to [facility]. She is on vimpat and keppra.  OBJECTIVE:                                                                                                                           Vital signs in last 24 hours: Temp:  [97.7 F (36.5 C)-98.7 F (37.1 C)] 98.1 F (36.7 C) (03/12 0730) Pulse Rate:  [75-125] 95 (03/12 0721) Resp:  [14-23] 23 (03/12 0721) BP: (83-121)/(61-90) 96/65 mmHg (03/12 0721) SpO2:  [100 %] 100 % (03/12 0700) FiO2 (%):  [30 %-40 %] 40 % (03/12 0721) Weight:  [63.866 kg (140 lb 12.8 oz)] 63.866 kg (140 lb 12.8 oz) (03/11 1825)  Intake/Output from previous day: 03/11 0701 - 03/12 0700 In: 287.7 [I.V.:187.7; IV Piggyback:100] Out: 665 [Urine:665] Intake/Output this shift:   Nutritional status: Diet NPO time specified  Past Medical History  Diagnosis Date  . Anxiety   . Arthritis   . Asthma   . Migraines   . Depression   . Emphysema/COPD   . Sleep apnea   . Stroke   . Seizure     Physical exam:intubated on the vent, off propofol. Head: normocephalic. Neck: supple, no bruits, no JVD. Cardiac: no murmurs. Lungs: clear. Abdomen: soft, no tender, no mass. Extremities: no edema. Skin: no rash   Neurologic Exam:  Mental Status: Intubated on  the vent, off sedation, able to follow commands. Cranial Nerves: II: Discs flat bilaterally; Visual fields grossly normal, pupils equal, round, reactive to light III,IV, VI: ptosis not present, extra-ocular motions intact bilaterally V,VII:  smile symmetric, facial light touch sensation normal bilaterally VIII: hearing normal bilaterally IX,X: uvula rises symmetrically XI: bilateral shoulder shrug no tested XII: midline tongue extension without atrophy or fasciculations Motor: Right :Upper extremity 5/5Left: Upper extremity 5/5 Lower extremity 5/5Lower extremity 5/5 Tone and bulk:normal tone throughout; no atrophy noted Sensory: reacts to pain Deep Tendon Reflexes:  Right: Upper Extremity Left: Upper extremity   biceps (C-5 to C-6) 2/4 biceps (C-5 to C-6) 2/4 tricep (C7) 2/4triceps (C7) 2/4 Brachioradialis (C6) 2/4Brachioradialis (C6) 2/4  Lower Extremity Lower Extremity  quadriceps (L-2 to L-4) 2/4 quadriceps (L-2 to L-4) 2/4 Achilles (S1) 2/4Achilles (S1) 2/4  Plantars: Right: downgoingLeft: downgoing Cerebellar: Unable to test Gait:  Unable to test  Lab Results: No results found for: CHOL Lipid Panel  Recent Labs  05/15/14 1855  TRIG 194*    Studies/Results: Dg Abd Portable 1v  05/15/2014   CLINICAL DATA:  Confirm oral gastric tube placement.  EXAM: PORTABLE ABDOMEN - 1 VIEW  COMPARISON:  Pelvis 11/17/2013 and chest x-ray 11/08/2013  FINDINGS: Exam demonstrates patient's enteric tube with tip and side-port over the stomach in the mid line upper abdomen. Bowel gas pattern is nonobstructive. Mild fecal retention over the right colon. No free peritoneal air. Stable rim calcified  structure over the left upper quadrant which may represent a splenic epidermoid cyst. Remaining bones and soft tissues are unremarkable.  IMPRESSION: Nonobstructive bowel gas pattern.  Enteric tube with tip just right of midline in the upper abdomen likely within the distal stomach.   Electronically Signed   By: Marin Olp M.D.   On: 05/15/2014 21:29    MEDICATIONS                                                                                                                        Scheduled: . lacosamide (VIMPAT) IV  100 mg Intravenous Q12H  . levETIRAcetam  750 mg Intravenous Q12H    ASSESSMENT/PLAN:                                                                                                            44 y/o with migraines, depression, anxiety, stroke, and seizures, transferred to [facility] for further evaluation and management of GTC status epilepticus.  c-EEG monitoring is unremarkable. Patient sustained a brief seizure during this assessment but there was not concomitant electrographic changes. As per husband, she was diagnosed with PNES at [facility]. Extubate and transfer to the floor today. Will continue vimpat and Keppra for now.  Donzetta Kohut  Armida Sans, MD [facility] [phone]  05/16/2014, 7:47 AM

## 2014-05-16 NOTE — Progress Notes (Signed)
PULMONARY / CRITICAL CARE MEDICINE   Name: [name] MRN: [id number] DOB: [birth year]    ADMISSION DATE:  05/15/2014 CONSULTATION DATE:  3/11  REFERRING MD : Donzetta Kohut, Neuro  CHIEF COMPLAINT:  seizures  INITIAL PRESENTATION:  44 year old with known seizure disorder, transferred from [facility] ED where she presented with multiple seizures requiring intubation. There was some concern that this could be psychogenic seizures. She was given Ativan, loaded with Vimpat, intubated and transferred. She was awake and responsive on arrival to 3100, but then had shaking of both upper extremities suggesting seizure, hence was sedated with propofol  STUDIES:  3/11 head CT negative 3/11 CT cervical spine negative, showed parathyroid adenoma 3/11 cont EEG: no epileptiform activity  SIGNIFICANT EVENTS:     SUBJECTIVE:  Passed SBT. Then apparent seizure without EEG epileptiform activity  VITAL SIGNS: Temp:  [97.7 F (36.5 C)-98.7 F (37.1 C)] 98.1 F (36.7 C) (03/12 0730) Pulse Rate:  [75-125] 95 (03/12 0721) Resp:  [14-23] 23 (03/12 0721) BP: (83-121)/(61-90) 96/65 mmHg (03/12 0721) SpO2:  [100 %] 100 % (03/12 0700) FiO2 (%):  [30 %-40 %] 40 % (03/12 0721) Weight:  [63.866 kg (140 lb 12.8 oz)] 63.866 kg (140 lb 12.8 oz) (03/11 1825) HEMODYNAMICS:   VENTILATOR SETTINGS: Vent Mode:  [-] PSV;CPAP FiO2 (%):  [30 %-40 %] 40 % Set Rate:  [14 bmp-15 bmp] 15 bmp Vt Set:  [420 mL-550 mL] 420 mL PEEP:  [5 cmH20] 5 cmH20 Pressure Support:  [5 cmH20] 5 cmH20 Plateau Pressure:  [14 cmH20-15 cmH20] 14 cmH20 INTAKE / OUTPUT:  Intake/Output Summary (Last 24 hours) at 05/16/14 0759 Last data filed at 05/16/14 0700  Gross per 24 hour  Intake 287.69 ml  Output    665 ml  Net -377.31 ml    PHYSICAL EXAMINATION: General: RASS 0, + F/C Neuro:  No focal deficits noted HEENT: NCAT Cardiovascular: reg, no M Lungs: no adventitious sounds Abdomen:  Soft, + BS Ext: warm, no edema   I have  reviewed all of today's lab results. Relevant abnormalities are discussed in the A/P section  CXR: NAD    ASSESSMENT / PLAN:  PULMONARY A: Acute respiratory failure, intubated for airway protection P:   Extubate Supp O2 Monitor in ICU psot extubation  CARDIOVASCULAR  A: no issues P:    RENAL A:  No issues P:   IVFs adjusted  GASTROINTESTINAL A:  No issues P:   SUP N/I post extubation Advance diet as tolerated  HEMATOLOGIC A:  No issues P:  DVT px: SQ heparin Monitor CBC intermittently Transfuse per usual ICU guidelines  ENDOCRINE A:  No issues   P:   Monitor glu on chem panels  NEUROLOGIC A:   Hx of seizure d/o Suspected pseudoseizures P:   RASS goal: 0 DC propofol AEDs per neuro Further eval per neuro  Merton Border, MD ; [facility] service Mobile [phone].  After 5:30 PM or weekends, call [phone]

## 2014-05-16 NOTE — Progress Notes (Signed)
LTM EEG complete and D/C'd

## 2014-05-16 NOTE — Procedures (Signed)
Extubation Procedure Note  Patient Details:   Name: [name] DOB: Jul 03, 1970 MRN: [id number]   Airway Documentation:     Evaluation  O2 sats: stable throughout Complications: No apparent complications Patient did tolerate procedure well. Bilateral Breath Sounds: Clear Suctioning: Airway, Oral Yes   Patient extubated to 2L nasal cannula per MD order.  Sats currently 99%.  Vitals are stable.  Positive cuff leak noted.  No evidence of stridor.  Patient able to speak post extubation.  Incentive spirometry performed x3 with achieved goal of 1000.  No apparent complications.  Philomena Doheny 05/16/2014, 8:29 AM

## 2014-05-16 NOTE — Progress Notes (Signed)
Pt arrived to 4N02 at current time.  Pt A&O x 4, c/o 6/10 R flank pain.  Pt V/S taken, pt still on O2 from 3MW, fluids running at 50 cc/hr.  Foley intact, unclamped. Pt without distress.  Family updated via phone. Diet ordered, will monitor.

## 2014-05-16 NOTE — Progress Notes (Signed)
Spoke with Dr. Aram Beecham and made him aware of most recent seizure episode to make sure he still approved of the transfer to [facility], he said to proceed with the transfer.   MD also gave medications for pain control for pt. See MAR.  Allee Busk GARNER

## 2014-05-16 NOTE — Progress Notes (Signed)
Pt experienced another 15-20 second episode with BUE tonic clonic movements with eyes closed and unresponsiveness. Pt is currently on 2L [place] and oxygenating at 98% but appears to be post-ictal/lethargic at this time.   Pt has transfer orders to [facility]. I am contacting Dr. Aram Beecham to report possible seizure activity again this morning, he was at the bedside when pt had similar episode prior to extubation. Transfer orders were placed after he witnessed bedside episode.  [name]

## 2014-05-16 NOTE — Progress Notes (Signed)
Pt c/o seeing spots and sensing a strong unrecognizable smell, as well as having problems articulating her words.  Pt can clearly explain these symptoms.  Pt is in video surveillance room, will monitor.

## 2014-05-16 NOTE — Progress Notes (Signed)
Patient complaining of muscle pain not relieved by Tylenol. M.D called. New orders received for pain and muscle spasm

## 2014-05-17 DIAGNOSIS — R569 Unspecified convulsions: Secondary | ICD-10-CM

## 2014-05-17 LAB — BASIC METABOLIC PANEL
Anion gap: 6 (ref 5–15)
BUN: 9 mg/dL (ref 6–23)
CO2: 24 mmol/L (ref 19–32)
CREATININE: 0.68 mg/dL (ref 0.50–1.10)
Calcium: 8.7 mg/dL (ref 8.4–10.5)
Chloride: 110 mmol/L (ref 96–112)
GFR calc Af Amer: >90 mL/min (ref >90)
GFR calc non Af Amer: >90 mL/min (ref >90)
GLUCOSE: 107 mg/dL — AB (ref 70–99)
Potassium: 3.7 mmol/L (ref 3.5–5.1)
Sodium: 140 mmol/L (ref 135–145)

## 2014-05-17 LAB — CBC
HCT: 33.8 % — ABNORMAL LOW (ref 36.0–46.0)
Hemoglobin: 11.3 g/dL — ABNORMAL LOW (ref 12.0–15.0)
MCH: 28.8 pg (ref 26.0–34.0)
MCHC: 33.4 g/dL (ref 30.0–36.0)
MCV: 86 fL (ref 78.0–100.0)
Platelets: 270 10*3/uL (ref 150–400)
RBC: 3.93 MIL/uL (ref 3.87–5.11)
RDW: 12.7 % (ref 11.5–15.5)
WBC: 6.4 10*3/uL (ref 4.0–10.5)

## 2014-05-17 MED ORDER — ACETAMINOPHEN 325 MG PO TABS: 650.0000 mg | ORAL_TABLET | Freq: Four times a day (QID) | ORAL | Status: DC | PRN

## 2014-05-17 MED ORDER — LACOSAMIDE 100 MG PO TABS: 100.0000 mg | ORAL_TABLET | Freq: Two times a day (BID) | ORAL | Status: DC

## 2014-05-17 MED ORDER — LEVETIRACETAM 750 MG PO TABS: 750.0000 mg | ORAL_TABLET | Freq: Two times a day (BID) | ORAL | Status: DC

## 2014-05-17 NOTE — Progress Notes (Signed)
Patient [name] home via car with partner.  DC instructions and prescriptions given to patient and fully understood.  Vital signs and assessments were stable.

## 2014-05-17 NOTE — Progress Notes (Signed)
NEURO HOSPITALIST PROGRESS NOTE   SUBJECTIVE:                                                                                                                        No further seizures or spells. Feels " sore" but otherwise has no neurological complains. On keppra and vimpat.  OBJECTIVE:                                                                                                                           Vital signs in last 24 hours: Temp:  [97.7 F (36.5 C)-98.2 F (36.8 C)] 97.7 F (36.5 C) (03/13 0630) Pulse Rate:  [83-100] 83 (03/13 0630) Resp:  [14-18] 16 (03/13 0630) BP: (81-110)/(44-75) 101/59 mmHg (03/13 0630) SpO2:  [97 %-100 %] 97 % (03/13 0630)  Intake/Output from previous day: 03/12 0701 - 03/13 0700 In: 239.8 [P.O.:120; I.V.:84.8; IV Piggyback:35] Out: 2836 [Urine:1560] Intake/Output this shift:   Nutritional status: Diet Carb Modified  Past Medical History  Diagnosis Date  . Anxiety   . Arthritis   . Asthma   . Migraines   . Depression   . Emphysema/COPD   . Sleep apnea   . Stroke   . Seizure      Physical exam: pleasant female in no apparent distress. Head: normocephalic. Neck: supple, no bruits, no JVD. Cardiac: no murmurs. Lungs: clear. Abdomen: soft, no tender, no mass. Extremities: no edema. Skin: no edema  Neurologic Exam:  General: Mental Status: Alert, oriented, thought content appropriate.  Speech fluent without evidence of aphasia.  Able to follow 3 step commands without difficulty. Cranial Nerves: II: Discs flat bilaterally; Visual fields grossly normal, pupils equal, round, reactive to light and accommodation III,IV, VI: ptosis not present, extra-ocular motions intact bilaterally V,VII: smile symmetric, facial light touch sensation normal bilaterally VIII: hearing normal bilaterally IX,X: uvula rises symmetrically XI: bilateral shoulder shrug XII: midline tongue extension without atrophy  or fasciculations  Motor: Right : Upper extremity   5/5    Left:     Upper extremity   5/5  Lower extremity   5/5     Lower extremity   5/5 Tone and bulk:normal tone throughout; no atrophy noted Sensory: Pinprick and light touch intact  throughout, bilaterally Deep Tendon Reflexes:  Right: Upper Extremity   Left: Upper extremity   biceps (C-5 to C-6) 2/4   biceps (C-5 to C-6) 2/4 tricep (C7) 2/4    triceps (C7) 2/4 Brachioradialis (C6) 2/4  Brachioradialis (C6) 2/4  Lower Extremity Lower Extremity  quadriceps (L-2 to L-4) 2/4   quadriceps (L-2 to L-4) 2/4 Achilles (S1) 2/4   Achilles (S1) 2/4  Plantars: Right: downgoing   Left: downgoing Cerebellar: normal finger-to-nose,  normal heel-to-shin test Gait: No tested for safety reasons CV: pulses palpable throughout    Lab Results: No results found for: CHOL Lipid Panel  Recent Labs  05/15/14 1855  TRIG 194*    Studies/Results: Portable Chest Xray  05/16/2014   CLINICAL DATA:  44 year old female with a history of acute respiratory failure.  EXAM: PORTABLE CHEST - 1 VIEW  COMPARISON:  05/15/2014  FINDINGS: Cardiomediastinal silhouette within normal limits in size and contour.  Interval removal of the defibrillator pads.  Unchanged position of the endotracheal tube, which terminates at the clavicular heads. There is apical kyphotic position on the current, with the measured distance to the carina approximately 2.5 cm.  Unchanged enteric tube.  No confluent airspace disease. Minimal atelectasis at the bases of the lungs.  Eggshell calcified structure in the left upper quadrant again present with the greatest diameter measuring 3.9 cm.  IMPRESSION: Unchanged position of the endotracheal tube which terminates proximally 2.5 cm above the carina. Unchanged enteric tube.  Lung volumes are low with minimal atelectasis at the bases.  Interval removal of the defibrillator pads.  Incidental note made of eggshell calcification at the left upper  quadrant, which may be a partially calcified aneurysm, or alternatively an epithelial cyst/lesion of the spleen. If an aneurysm, the size would warrant evaluation for potential treatment. Once the patient has cleared this acute process, further evaluation with abdominal ultrasound may be useful.  Signed,  Dulcy Fanny. Earleen Newport, DO  [facility]  [facility]   Electronically Signed   By: Corrie Mckusick D.O.   On: 05/16/2014 08:52   Dg Abd Portable 1v  05/15/2014   CLINICAL DATA:  Confirm oral gastric tube placement.  EXAM: PORTABLE ABDOMEN - 1 VIEW  COMPARISON:  Pelvis 11/17/2013 and chest x-ray 11/08/2013  FINDINGS: Exam demonstrates patient's enteric tube with tip and side-port over the stomach in the mid line upper abdomen. Bowel gas pattern is nonobstructive. Mild fecal retention over the right colon. No free peritoneal air. Stable rim calcified structure over the left upper quadrant which may represent a splenic epidermoid cyst. Remaining bones and soft tissues are unremarkable.  IMPRESSION: Nonobstructive bowel gas pattern.  Enteric tube with tip just right of midline in the upper abdomen likely within the distal stomach.   Electronically Signed   By: Marin Olp M.D.   On: 05/15/2014 21:29    MEDICATIONS  Scheduled: . lacosamide (VIMPAT) IV  100 mg Intravenous Q12H  . levETIRAcetam  750 mg Intravenous Q12H    ASSESSMENT/PLAN:                                                                                                           44 y/o with migraines, depression, anxiety, stroke, and seizures, transferred to [facility] for further evaluation and management of GTC status epilepticus.  c-EEG monitoring is unremarkable. She did have seizures without concomitant EEG correlate. She needs to be admitted to [facility] for further seizure classification  and patient said that she is suppose to undergo further testing in the EMU at [facility] or [facility] and I encouraged her to contact her neurologist in that regard. She will be discharge home today. Will continue her current AED regimen for now.  Dorian Pod, MD [facility] [phone]  05/17/2014, 8:50 AM

## 2014-05-17 NOTE — Progress Notes (Signed)
Utilization Review Completed.   Marilin Kofman, RN, BSN Nurse Case Manager  

## 2014-05-17 NOTE — Discharge Summary (Signed)
Physician Discharge Summary   Patient ID: Danean Marner [id number] 44 y.o. June 20, 1970  Admit date: 05/15/2014  Discharge date and time: 05/17/2014, 8:36 AM  Admitting Physician: Amie Portland, MD   Discharge Physician: Dorian Pod, MD  Admission Diagnoses: STATUS EPILEPTICUS  Discharge Diagnoses: Non epileptic seizures  Admission Condition: critical  Discharged Condition: stable  Indication for Admission: ICU management status epilepticus  Hospital Course: patient was transferred to the NICU intubated on the ven, sedated with propofol. Despite sedation, she was able to interact with ICU staff and was able to follow commands. Patient underwent continuous video-eeg monitoring that interictally did not demonstrate epileptiform discharges. She sustained 2 of her typical seizures while on c-EEG and there was not concomitant EEG correlate. Otherwise, she did not have other neurological issues or medical complications. She was treated with IV vimpat and IV vimpat. She did not sustain further spells and was sent to the neurology floor where she did quite well and patient was discharged home with instructions to see her neurologist as soon as possible in order to make arrangements for admission to EMU at [facility] or [place]. Continue keppra and vimpat in the meantime.   Consults: critical care medicine  Significant Diagnostic Studies: continuous video-EEG monitoring.  Treatments: keppra and vimpat  Discharge Exam: none   Disposition: Final discharge disposition not confirmed  Patient Instructions:    Medication List    ASK your doctor about these medications        Acetaminophen 500 MG coapsule  Take 500 mg by mouth as needed. For fever     albuterol (2.5 MG/3ML) 0.083% nebulizer solution  Commonly known as:  PROVENTIL  Take 2.5 mg by nebulization every 6 (six) hours as needed for wheezing or shortness of breath.     Albuterol Sulfate 108 (90 BASE) MCG/ACT Aepb  Inhale  into the lungs.     aspirin EC 81 MG tablet  Take 81 mg by mouth daily.     butalbital-acetaminophen-caffeine 50-325-40-30 MG per capsule  Commonly known as:  FIORICET WITH CODEINE  Take 1 capsule by mouth every 4 (four) hours as needed for headache.     butalbital-aspirin-caffeine 50-325-40 MG per capsule  Commonly known as:  FIORINAL  Take by mouth daily as needed.     diazepam 10 MG tablet  Commonly known as:  VALIUM  Take 10 mg by mouth every 6 (six) hours as needed for anxiety.     DILANTIN 100 MG ER capsule  Generic drug:  phenytoin  Take 100 mg by mouth 3 (three) times daily.     EPINEPHrine 0.3 mg/0.3 mL Soaj injection  Commonly known as:  EPI-PEN  Inject 0.3 mg into the muscle.     EPIPEN 2-PAK 0.3 mg/0.3 mL Soaj injection  Generic drug:  EPINEPHrine     EQL NATURAL ZINC 50 MG Tabs  Take 50 mg by mouth daily.     FLUoxetine 40 MG capsule  Commonly known as:  PROZAC  Take 40 mg by mouth daily.     FLUoxetine 20 MG capsule  Commonly known as:  PROZAC  Take 20 mg by mouth daily.     Gabapentin (Once-Daily) 300 MG Tabs  Take 300 mg by mouth daily.     Gabapentin (Once-Daily) 600 MG Tabs  Take by mouth daily.     gabapentin 300 MG capsule  Commonly known as:  NEURONTIN  Take 300 mg by mouth 2 (two) times daily.     lamoTRIgine 25 MG tablet  Commonly known as:  LAMICTAL  Take 25 mg by mouth 4 (four) times daily.     lamoTRIgine 100 MG tablet  Commonly known as:  LAMICTAL  Take 100 mg by mouth.     lamoTRIgine 200 MG tablet  Commonly known as:  LAMICTAL  Take 200 mg by mouth 2 (two) times daily.     levETIRAcetam 750 MG tablet  Commonly known as:  KEPPRA  Take 750 mg by mouth 2 (two) times daily.     Loratadine 10 MG Caps  Take 10 mg by mouth daily.     meloxicam 7.5 MG tablet  Commonly known as:  MOBIC  Take 7.5 mg by mouth 2 (two) times daily.     methocarbamol 750 MG tablet  Commonly known as:  ROBAXIN  Take 750 mg by mouth.      mirtazapine 15 MG tablet  Commonly known as:  REMERON  Take 15 mg by mouth at bedtime.     mirtazapine 15 MG tablet  Commonly known as:  REMERON  Take 15 mg by mouth daily.     phenazopyridine 100 MG tablet  Commonly known as:  PYRIDIUM  Take 100 mg by mouth 3 (three) times daily as needed for pain.     PHENobarbital 32.4 MG tablet  Commonly known as:  LUMINAL  Take 32.4 mg by mouth at bedtime.     promethazine 25 MG tablet  Commonly known as:  PHENERGAN  Take 25 mg by mouth every 6 (six) hours as needed for nausea or vomiting.     tiZANidine 4 MG tablet  Commonly known as:  ZANAFLEX  Take 4 mg by mouth every 8 (eight) hours as needed.     tiZANidine 4 MG tablet  Commonly known as:  ZANAFLEX  Take 4 mg by mouth 3 (three) times daily.     topiramate 25 MG tablet  Commonly known as:  TOPAMAX  Take 25 mg by mouth daily.     topiramate 50 MG tablet  Commonly known as:  TOPAMAX  Take 50 mg by mouth daily.       Activity: activity as tolerated Diet: regular diet Wound Care: none needed  Follow-up with her outpatient neurologist as soon as possible.  a few   Signed: Dorian Pod, MD [facility] @[facility] @ 05/17/2014 8:36 AM

## 2014-06-27 NOTE — Discharge Summary (Signed)
Dates of Admission and Diagnosis:  Date of Admission 09-Nov-2013   Date of Discharge 13-Nov-2013   Admitting Diagnosis Seizure disorder,   Final Diagnosis Conversion disorder, pseudo-seizures, chronic pain, depression, headaches   Discharge Diagnosis 1 Conversion disorder, pseudo-seizures   2 Chronic pain   3 Depression   4 Headaches    Chief Complaint/History of Present Illness 44 yo F with h/o seizure disorder, admitted with increased frequency of seizures. She had a fall at home and hit her head on the right side. CT head was negative in the ED. Initial labs were unremarkable. Refer to [place] for details.   Allergies:  IVP Dye: Anaphylaxis  Shellfish: Anaphylaxis, Angioedema  Bee Stings: Anaphylaxis, Angioedema  Penicillin: Seizures  Sulfa drugs: Hives, Itching    Thyroid:  07-Sep-15 06:21   Thyroid Stimulating Hormone 1.32 (0.45-4.50 (IU = International Unit)  ----------------------- Pregnant patients have  different reference  ranges for TSH:  - - - - - - - - - -  Pregnant, first trimetser:  0.36 - 2.50 uIU/mL)  Hepatic:  05-Sep-15 21:41   Bilirubin, Total 0.2  Alkaline Phosphatase  154 (46-116 NOTE: New Reference Range 09/23/13)  SGPT (ALT) 31 (14-63 NOTE: New Reference Range 09/23/13)  SGOT (AST) 36  Total Protein, Serum 7.0  Albumin, Serum  3.3  Cardiology:  05-Sep-15 21:36   Ventricular Rate 93  Atrial Rate 93  P-R Interval 140  QRS Duration 78  QT 384  QTc 477  P Axis 56  R Axis 80  T Axis 65  ECG interpretation Normal sinus rhythm Normal ECG When compared with ECG of 16-Aug-2013 16:45, No significant change was found ----------unconfirmed---------- Confirmed by OVERREAD, NOT ([id number]), editor PEARSON, BARBARA ([id number]) on 11/11/2013 9:14:42 AM  Routine Chem:  05-Sep-15 21:41   Glucose, Serum  115  BUN 14  Creatinine (comp) 0.77  Sodium, Serum 141  Potassium, Serum 3.5  Chloride, Serum  110  CO2, Serum  20  Calcium (Total), Serum  7.8   Osmolality (calc) 283  eGFR (African American) >60  eGFR (Non-African American) >60 (eGFR values <15mL/min/1.73 m2 may be an indication of chronic kidney disease (CKD). Calculated eGFR is useful in patients with stable renal function. The eGFR calculation will not be reliable in acutely ill patients when serum creatinine is changing rapidly. It is not useful in  patients on dialysis. The eGFR calculation may not be applicable to patients at the low and high extremes of body sizes, pregnant women, and vegetarians.)  Anion Gap 11  Result Comment AST/ALT/POTASSIUM - Slight hemolysis, interpret results with CREATININE - caution.  - SPECIMEN LIPEMIC  Result(s) reported on 08 Nov 2013 at 11:00PM.  Routine UA:  05-Sep-15 21:41   Color (UA) Straw  Clarity (UA) Clear  Glucose (UA) Negative  Bilirubin (UA) Negative  Ketones (UA) Negative  Specific Gravity (UA) 1.008  Blood (UA) Negative  pH (UA) 7.0  Protein (UA) Negative  Nitrite (UA) Negative  Leukocyte Esterase (UA) Negative (Result(s) reported on 09 Nov 2013 at 12:57AM.)  RBC (UA) 1 /HPF  WBC (UA) 3 /HPF  Bacteria (UA) 3+  Epithelial Cells (UA) 1 /HPF  Mucous (UA) PRESENT (Result(s) reported on 09 Nov 2013 at 12:57AM.)  Routine Hem:  05-Sep-15 21:41   WBC (CBC) 10.2  RBC (CBC) 4.25  Hemoglobin (CBC) 12.8  Hematocrit (CBC) 37.1  Platelet Count (CBC) 296  MCV 87  MCH 30.2  MCHC 34.7  RDW 12.7  Neutrophil % 64.4  Lymphocyte % 24.9  Monocyte % 8.2  Eosinophil % 1.7  Basophil % 0.8  Neutrophil #  6.6  Lymphocyte # 2.5  Monocyte # 0.8  Eosinophil # 0.2  Basophil # 0.1 (Result(s) reported on 08 Nov 2013 at 11:00PM.)   PERTINENT RADIOLOGY STUDIES: XRay:    05-Sep-15 22:26, Chest Portable Single View  Chest Portable Single View   REASON FOR EXAM:    repeated seizures today  COMMENTS:       PROCEDURE: DXR - DXR PORTABLE CHEST SINGLE VIEW  - Nov 08 2013 10:26PM     CLINICAL DATA:  Shortness of  breath.    EXAM:  PORTABLE CHEST - 1 VIEW    COMPARISON:  08/16/2013    FINDINGS:  The heart size and mediastinal contours are within normal limits.  Both lungs are clear. The visualized skeletal structures are  unremarkable.     IMPRESSION:  No active disease.      Electronically Signed    By: Earle Gell M.D.    On: 11/08/2013 22:54        Verified By: Marlaine Hind, M.D.,  [place]:    09-Sep-15 08:59, US Abdomen General Survey  US Abdomen General Survey   REASON FOR EXAM:    abdominal pain, elevated alk phos  COMMENTS:       PROCEDURE: [place]  - US ABDOMEN GENERAL SURVEY  - Nov 12 2013  8:59AM     CLINICAL DATA:  44 year old female with abdominal pain, nausea  vomiting. Elevated alkaline phosphatase. Initial encounter.    EXAM:  ULTRASOUND ABDOMEN COMPLETE    COMPARISON:  None.    FINDINGS:  Gallbladder:  No gallstones or wall thickening visualized. No sonographic Murphy  sign noted. Incidental Phrygian cap.    Common bile duct:    Diameter: 4 mm, normal    Liver:    No focal lesion identified. Echogenicity at the upper limits of  normal. No intrahepatic biliary ductal dilatation. No discrete liver  lesion.    IVC:  No abnormality visualized.    Pancreas:    Visualized portion unremarkable.    Spleen:    Size and appearance within normal limits.    Right Kidney:    Length: 12.6 cm. Echogenicity within normal limits. No mass or  hydronephrosis visualized.  Left Kidney:    Length: 12.1 cm. Echogenicity within normal limits. No mass or  hydronephrosis visualized.    Abdominal aorta:    No aneurysm visualized.    Other findings:    Small bilateral pleural effusions.     IMPRESSION:  1. Negative sonographic appearance of the abdomen.  2. Small bilateral pleural effusions.      Electronically Signed    By: Lars Pinks M.D.    On: 11/12/2013 09:24         Verified By: Gwenyth Bender. HALL, M.D.,  CT:    05-Sep-15 22:22, CT Head Without  Contrast  CT Head Without Contrast   REASON FOR EXAM:    6-7 seizures today, fell and hit head  COMMENTS:       PROCEDURE: CT  - CT HEAD WITHOUT CONTRAST  - Nov 08 2013 10:22PM     CLINICAL DATA:  Seizures x3 over 30 min. History of seizures since  2010. Patient fell and hit head today.    EXAM:  CT HEAD WITHOUT CONTRAST    TECHNIQUE:  Contiguous axial images were obtained from the base of the skull  through the vertex without intravenous  contrast.  COMPARISON:  08/16/2013    FINDINGS:  Ventricles and sulci are symmetrical. No mass effect or midline  shift. No abnormal extra-axial fluid collections. Gray-white matter  junctions are distinct. Basal cisterns are not effaced. No evidence  of acute intracranial hemorrhage. No depressed skull fractures.  Visualized paranasal sinuses and mastoid air cells are not  opacified.     IMPRESSION:  No acute intracranial abnormalities.      Electronically Signed    By: Lucienne Capers M.D.    On: 11/08/2013 22:51         Verified By: Neale Burly, M.D.,   Pertinent Past History:  Pertinent Past History Chronic pain Headaches Depression   Hospital Course:  Hospital Course 44 yo F with h/o seizure disorder, admitted with increased frequency of seizures. CT head was negative. Labs were unremarkable. Patient was evaluated by Neurology and impression was pseudo-seizures based on history, exam and prior EEG findings at [facility]. Patient was also evaluated by Psychiatry, recommendations included increasing anti-depressant, avoiding narcotic analgesics and out-patient follow up with Psychiatrist, psychotherapist and counselor. EEG done yesterday revealed no evidence of epilepsy. Patient had activation to ammonia capsules strongly suggestive of pseudo-seizures. Keppra was increased to 1500 mg Q 12 hours, per neurology. Patient takes Topamax for migraine prophylaxis. In light of cognitive slowing neurology recommended to taper and discontinue  Topamax and start Calcium channel blocker for migraine prophylaxis. Shall discontinue Fioricet to avoid rebound headaches. Psych input for conversion disorder appreciated. Constipation was treated with Miralax and soap suds enema. Abdominal u/s ordered for mildly elevated alkaline phosphatase was negative for gallstones or biliary dilatation. Chronic pain: h/o multiple old injuries, Neurontin was increased to 600 mg TID, schedule pain management eval as out-pt. Exam: Awake, alert, oriented x3, NAD, Chest: clear, CVS: RRR, abdomen: soft, nontender, + BS. Ext: no edema. Neuro: nonfocal. Plan is to discharge home today. Patient will follow up with Psychiatrist, psychotherapist and counselor as out-patient.   Condition on Discharge Stable   DISCHARGE INSTRUCTIONS HOME MEDS:  Medication Reconciliation: Patient's Home Medications at Discharge:     Medication Instructions  proair hfa cfc free 90 mcg/inh inhalation aerosol  1 to 2 puff(s) inhaled 4 times a day as needed for shortness of breath/ wheezing    epipen 2-pak 0.3 mg injectable kit  use 1 injectable (0.3 milligrams) injectable as needed   meloxicam 7.5 mg oral tablet  1 tab(s) orally 2 times a day   promethazine 25 mg oral tablet  1 tab(s) orally 4 times a day, As Needed - for Nausea, Vomiting   remeron 15 mg oral tablet  1 tab(s) orally once a day (at bedtime)   gabapentin 300 mg oral capsule  2 cap(s) orally 3 times a day   topiramate 25 mg oral tablet  1 tab(s) orally 2 times a day   levetiracetam 750 mg oral tablet  2 tab(s) orally every 12 hours   escitalopram 20 mg oral tablet  1 tab(s) orally once a day    PRESCRIPTIONS: ELECTRONICALLY SUBMITTED   Physician's Instructions:  Diet Regular   Activity Limitations As tolerated  Fall precautions   Return to Work after follow up visit with MD   Time frame for Follow Up Appointment 1-2 weeks   Other Comments Patient will follow up with [facility] Neurology, [facility] Psychiatrist, psychotherapist  and counselor as out-patient.     Candiss Norse, Caro Brundidge(Ordered): [facility], [address] [place], [place] [address], [place]  Electronic Signatures: Glendon Axe (  MD)  (Signed 10-Sep-15 17:33)  Authored: ADMISSION DATE AND DIAGNOSIS, CHIEF COMPLAINT/HPI, Allergies, PERTINENT LABS, PERTINENT RADIOLOGY STUDIES, PERTINENT PAST HISTORY, HOSPITAL COURSE, DISCHARGE INSTRUCTIONS HOME MEDS, PATIENT INSTRUCTIONS, Follow Up Physician   Last Updated: 10-Sep-15 17:33 by Glendon Axe (MD)

## 2014-06-27 NOTE — Consult Note (Signed)
PATIENT NAMEMIKEYLA, [name] MR#:  [id number] DATE OF BIRTH:  07-Feb-1971  DATE OF CONSULTATION:  11/09/2013  REFERRING PHYSICIAN:   CONSULTING PHYSICIAN:  Leotis Pain, MD  REASON FOR CONSULTATION: Seizure activity.  HISTORY OF PRESENT ILLNESS: This is a 44 year old female with history of seizure disorder since 2010. The patient was recently admitted to [facility] about 2 months back for falls and seizures. At that time, the patient had multiple seizures. She had epilepsy study with EEG electrodes placed on her head. During that time, the patient had multiple seizures and it was determined that the patient's seizures were nonepileptic and she was described it was stress induced. She was taking phenobarbital and Dilantin, which were discontinued. As per husband, they stated the patient has usually 1 seizure a day, which could be generalized tonic-clonic most of the time. Description of the seizures, the patient states that she is aware of her shaking but just cannot stop it. No tongue biting and no urinary incontinence. On discharge from [facility], the patient was started on Keppra. She is currently on 750 mg q. 12. She takes Topamax 25 mg daily for headaches. On further history, the patient has been having increased stress in her life, recently found out specifically yesterday that the patient's mother has metastatic ovarian cancer and needed significant surgery. The patient is also complaining of pain in multiple areas.  Reason for admission is multiple episodes of seizure activity. Currently somnolent, but close to baseline.   REVIEW OF SYSTEMS: Generalized fatigue. Denies any blurry vision. Denies any nausea. Denies any chest pain. Denies any abdominal pain. No heat or cold intolerance. No weakness on one side of the body compared to the other.   PAST MEDICAL HISTORY: Epilepsy, asthma, chronic pain syndrome, rotator cuff injury.   PAST SURGICAL HISTORY: Hysterectomy, rhinoplasty, tonsillectomy,  adenoidectomy, cesarean section.   HOME MEDICATIONS: Include Fioricet, diazepam, gabapentin, Lexapro, promethazine, Remeron, Topamax 25 mg a day.   LABORATORY DATA: Workup has been reviewed. Imaging has been reviewed. No acute intracranial pathology.   PHYSICAL EXAMINATION:  VITAL SIGNS: Include a temperature of 97.9, pulse 95, respirations 20, blood pressure 113/76.  NEUROLOGIC: The patient is awake, alert and oriented to time, told me it was the hospital, could not tell me name of the hospital. Speech is slow but appears to be fluent. No signs of dysarthria or aphasia. Extraocular movements intact. Pupils 4 to 2, reactive bilaterally.  Extraocular movements are intact. Facial sensation intact. Facial motor is intact. Tongue is midline. Uvula elevates symmetrically. Shoulder shrug intact. Motor appears to be 4+/5 bilaterally upper and lower extremities. Sensation intact to light touch and temperature. Coordination: Finger-to-nose intact. Reflexes diminished throughout. Gait not assessed.   IMPRESSION: A 44 year old female with diagnosis of epilepsy since 2010, was on phenobarbital and Dilantin, recently discontinued about 2 months ago at [facility] where her seizures were deemed to be nonepileptic but stressed induced. At that time, the patient was switched to [place], currently on Keppra 750 q. 12. The patient is on Topamax 25 daily for headache prevention. Upon further questioning, it appears the patient has increased stress in her life, specifically yesterday she states that she found out that her mother had worsening of her metastatic cancer requiring surgery.   PLAN: I do not think these are epileptic seizures, but the patient is very worried about them. I will increase her Keppra to a gram q. 12 and the patient has been told that her seizures will improve. I will increase the Topamax  to 50 mg twice a day because she is complaining of daily headaches and that is used for headache prevention. It appears the  patient is on Fioricet at home. I have asked the patient to titrate that Fioricet down as much as possible because it is going to close rebound headaches. The patient appears to be pain seeking, asking for pain medications for multiple injuries. We will hold off and discuss this with primary team before prescribing anything. Otherwise, for the pain, the patient is also on Neurontin 600 mg twice a day, that can be increased to 600 mg 3 times a day if needed. I do not think the patient needs an EEG. I do not think she needs any further imaging from a neurological standpoint. I feel this patient probably can be discharged in the morning with neurological followup as outpatient and psychiatric followup for possible conversion disorder.   Thank you, it was a pleasure seeing this patient. Please call with any questions.    ____________________________ Leotis Pain, MD yz:TT D: 11/09/2013 18:08:17 ET T: 11/09/2013 18:24:01 ET JOB#: [id number]  cc: Leotis Pain, MD, <Dictator> Leotis Pain MD ELECTRONICALLY SIGNED 12/08/2013 14:23

## 2014-06-27 NOTE — Consult Note (Signed)
Psychiatry: Came by to check on patient this afternoon.  I found her alone in the room and initially unresponsive.  Both of her arms were curled up symmetrically and her head was thrown back but she appeared to be breathing normally.  She did not respond to speaking her name or light touch.  On examination her arms were lucid and easy to move.  There was no sign of clonus or tonicity.  The presentation did not appear to be consistent with a seizure.  After a few seconds she woke up and began to speak to me with slurred speech that gradually cleared up.  She reports that she is feeling about the same.  Mood is a little bit anxious.  Does not feel overly depressed.  No suicidal thinking.  No hallucinations.  Tolerating medicine well. change to diagnosis.  I should point out that I do not think this patient is malingering.  I think this is probably a real conversion disorder type of presentation. glad that social work is making sure she has follow-up appointments.  I will check up again tomorrow if she is still here.  Electronic Signatures: Jovonte Commins, Madie Reno (MD)  (Signed on 09-Sep-15 17:42)  Authored  Last Updated: 09-Sep-15 17:42 by Gonzella Lex (MD)

## 2014-06-27 NOTE — Consult Note (Signed)
Brief Consult Note: Diagnosis: depressio nos, conversion disorder.   Patient was seen by consultant.   Consult note dictated.   Comments: PsychiatrY: Patient seen and chart reviewed. Patient with "non-epileptic seizures" ie pseudoseizures. They are a diagnosis of exclusion but if neuro is pretty sure of it then yes, they are usually considered a conversion disorder.There is no treatment for conversion per se. She is depressed but not suicidal or psychotic. Continue anti-depressants and refer to see a therapist long term.  Electronic Signatures: Gonzella Lex (MD)  (Signed 07-Sep-15 16:12)  Authored: Brief Consult Note   Last Updated: 07-Sep-15 16:12 by Gonzella Lex (MD)

## 2014-06-27 NOTE — H&P (Signed)
PATIENT NAMEZAMIYAH, [name] MR#:  [id number] DATE OF BIRTH:  1970-12-31  DATE OF ADMISSION:  11/08/2013  REFERRING PHYSICIAN: Dr. Conni Slipper.  PRIMARY CARE DOCTOR: Dr. Candiss Norse at [facility].   ADMISSION DIAGNOSIS: Epilepsy.   HISTORY OF PRESENT ILLNESS: This is a 44 year old Caucasian female who presents to the Emergency Department following multiple seizures. The patient states that she usually has 1 to 2 seizures per day. Today, the patient had 6 to 7 per report. She has had so many seizures she does not remember specifically how many she had today. Following one seizure she had fallen and hit the right side of her head. She did not have any postictal vomiting. She denies dizziness, but admits to fatigue as she usually has following a seizure. After discussion with neurology  consultants the Emergency Department staff called for the patient to be admitted to the observation service.   REVIEW OF SYSTEMS:  CONSTITUTIONAL: The patient denies fever, but admits to some generalized weakness.  EYES: Denies diplopia or inflammation.  EARS, NOSE AND THROAT: Denies tinnitus or difficulty swallowing.  RESPIRATORY: Denies cough or shortness of breath.  CARDIOVASCULAR: The patient denies chest pain or dyspnea on exertion.  GASTROINTESTINAL: The patient denies nausea, vomiting, diarrhea or abdominal pain.  GENITOURINARY: The patient denies dysuria, increased frequency or hesitancy.  ENDOCRINE: The patient denies polyuria or polydipsia.  HEMATOLOGIC AND LYMPHATIC: The patient denies easy bruising or bleeding.  INTEGUMENT: The patient denies rashes or lesions.  MUSCULOSKELETAL: The patient admits to bilateral shoulder pain but denies cramps.  NEUROLOGIC: The patient denies numbness in extremities or dysarthria.  PSYCHIATRIC: The patient denies depression or suicidal ideation.   PAST MEDICAL HISTORY: Epilepsy, asthma, chronic pain syndrome and rotator cuff damage.   PAST SURGICAL HISTORY:  Partial hysterectomy, rhinoplasty, tonsillectomy with adenoidectomy, C-section and abdominal adhesion lysis.   FAMILY HISTORY: Diabetes mellitus type 2, lung cancer in her mother, thyroid disease among the women on her maternal side of the family, hypertension and asthma.   SOCIAL HISTORY: The patient smokes E-cigarettes. She denies alcohol or drug abuse. She is married.   MEDICATIONS:  1. Fioricet 325 mg/50 mg/40 mg capsule, 1 to 2 capsules p.o. as needed.  2. Diazepam 10 mg 1 tablet p.o. 4 times a day as needed for anxiety or convulsions.  3. EpiPen as needed for anaphylaxis.  4. Gabapentin 300 mg 2 capsules every morning and 3 capsules at bedtime.  5. Keppra 750 mg 1 tab p.o.  b.i.d.  6. Lexapro 10 mg 1 tab p.o. daily.  7. Meloxicam 7.5 mg 1 tab p.o. b.i.d.  8. Pro-Air  90 mcg inhaler 1 to 2 puffs inhaled every 4 hours as needed for cough, wheezing, or shortness of breath.  9. Promethazine 25 mg 1 tablet p.o. 4 times a day as needed for nausea or vomiting.  10. Remeron 15 mg 1 tab p.o. daily at bedtime.  11. Topamax 1 tab p.o. daily.   ALLERGIES: IV DYE, PENICILLIN, SULFA DRUGS, SHELLFISH AND BEE STINGS.  PERTINENT LABORATORY RESULTS AND RADIOGRAPHIC FINDINGS: Glucose is 115, BUN 14, creatinine 0.77, chloride is 110, bicarbonate 20, calcium is 7.8, serum albumin is 3.3, alkaline phosphatase 154, AST is 36, ALT is 31. CT scan of the head without contrast shows no acute intracranial abnormalities.   PHYSICAL EXAMINATION:  VITAL SIGNS: Temperature is 98.6, pulse 95, respirations 18, blood pressure 118/83 pulse oximetry 100% on room air.  GENERAL: The patient is alert and oriented x 3 in no  apparent distress.  HEENT: Normocephalic but with bruising along the right zygomatic arch and fore head and the temporal aspect of the forehead. Pupils equal, round, and reactive to light and accommodation. Extraocular movements are intact. Mucous membranes are moist.  NECK: Trachea is midline. No  adenopathy.  CHEST: Symmetric and atraumatic.   CARDIOVASCULAR: Regular rate and rhythm. Normal S1, S2. No rubs, clicks, or murmurs appreciated.  LUNGS: Clear to auscultation bilaterally. Normal effort and excursion.  ABDOMEN: Positive bowel sounds, soft, nontender, nondistended. No hepatosplenomegaly.  GENITOURINARY: Deferred.  MUSCULOSKELETAL: The patient moves all 4 extremities equally.  SKIN: No rashes or lesions.  EXTREMITIES: No clubbing, cyanosis, or edema.  NEUROLOGIC: Cranial nerves II through XII grossly intact. I did not stand the patient up or try to walk her but there is no dysmetria or dysdiadochokinesis on bedside exam.  PSYCHIATRIC: Mood is normal. Affect is congruent.   ASSESSMENT AND PLAN: This is a 44 year old female with epilepsy who is admitted for recurrent seizures.   1. Seizures. There is facial bruising from her fall. Thankfully no bleed on head CT. The patient was loaded with Keppra in the Emergency Department. We will continue her oral antiepileptics and have a neurologic consult in the morning.  2. Chronic pain. Continue home medications. We will minimize IV analgesics.  3. Deep vein thrombosis prophylaxis. Sequential compression devices and heparin primarily because latter is reversible. The patient also notes that she has a hole in her heart. I cannot detect any murmurs.  4. Gastrointestinal prophylaxis is unnecessary as the patient is not critically ill.   CODE STATUS: The patient is a full code.   TIME SPENT ON ADMISSION ORDERS AND PATIENT CARE: Approximately 30 minutes.      ____________________________ Norva Riffle. Marcille Blanco, MD msd:JT D: 11/09/2013 01:24:03 ET T: 11/09/2013 03:39:18 ET JOB#: [id number]  cc: Norva Riffle. Marcille Blanco, MD, <Dictator> Norva Riffle DIAMOND MD ELECTRONICALLY SIGNED 11/20/2013 23:46
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
## 2014-07-05 NOTE — Consult Note (Signed)
PATIENT NAMEANALYAH, [name] MR#:  [id number] DATE OF BIRTH:  1970-08-16  DATE OF CONSULTATION:  05/15/2014  REFERRING PHYSICIAN:   CONSULTING PHYSICIAN:  Leotis Pain, MD  REASON FOR CONSULTATION: Status epilepticus.  HISTORY OF PRESENT ILLNESS: A 44 year old female with a long-term history of seizures since 06-19-2008, started after the death of her fiance. The patient has been having on and off generalized tonic-clonic activity for the past 6 years, been seen by Dr. Melrose Nakayama as outpatient with multiple antiepileptic medications. The patient has been having seizures. Dr. Melrose Nakayama has been treating with multiple antiepileptic medication including Lamictal, Keppra, Vimpat. The patient, as per her husband, still has multiple daily seizures. Reason for coming to the ED today is multiple back to back seizures described as generalized tonic-clonic activity. The patient's eye widen, increased tone, no urinary incontinence. Post seizure episodes the patient comes back to her baseline. I witnessed at least 2 or 3 of these events. The patient stiffens up, opens her eyes wide, grasps my hand, and then after a minute comes back to herself and starts following commands again. No family history of seizures. Not a smoker. No drug use.   REVIEW OF SYSTEMS: Unable to obtain.   CAT scan of the head revealed no acute intracranial pathology noted.   NEUROLOGIC EVALUATION: The patient follows some commands in between her seizures. Pupils 4 to 2, reactive bilaterally. Tongue is midline. Motor strength, withdrawal from painful stimuli bilaterally and simply follows commands. No focal weakness on one side of the body compared to the other. Extraocular movements intact.  IMPRESSION: A 44 year old female with a past medical history of seizures for the past 6 years, diagnosed as PNES nonepileptic seizures by outpatient neurologist Dr. Melrose Nakayama, presents with increased seizure frequency. The patient has a long-term history of  anxiety and depression. She also started her seizures after the death of her fiance in 06-19-2008. Looking at these seizures, as I witnessed 3 or 4 them, they are short-lived; the patient comes back to her own baseline, no tongue biting, no urinary incontinence. They look like pseudo nonepileptic seizures. The patient is being started on Vimpat, given 400 mg now and started 200 b.i.d. There is a possibility the patient might need intubation for airway protection. The patient is going to need to be transferred to an outside facility for continuous  EEG monitoring as we do not have that available. At this point, [facility] and [facility] have been contacted. If those are unavailable, the patient will be accepted to [facility]. This case was discussed with husband and emergency department staff.    ____________________________ Leotis Pain, MD yz:at D: 05/15/2014 14:52:54 ET T: 05/15/2014 17:10:51 ET JOB#: [id number]  cc: Leotis Pain, MD, <Dictator> Leotis Pain MD ELECTRONICALLY SIGNED 05/21/2014 12:15

## 2014-10-19 DIAGNOSIS — R6889 Other general symptoms and signs: Secondary | ICD-10-CM | POA: Diagnosis not present

## 2014-10-19 DIAGNOSIS — R51 Headache: Secondary | ICD-10-CM | POA: Diagnosis not present

## 2014-10-19 DIAGNOSIS — R0689 Other abnormalities of breathing: Secondary | ICD-10-CM | POA: Diagnosis not present

## 2014-11-03 ENCOUNTER — Observation Stay
Admission: EM | Admit: 2014-11-03 | Discharge: 2014-11-04 | Disposition: A | Discharge status: Home / Self Care | Payer: Commercial Managed Care - HMO | Attending: Internal Medicine | Admitting: Internal Medicine

## 2014-11-03 ENCOUNTER — Emergency Department: Payer: Commercial Managed Care - HMO

## 2014-11-03 ENCOUNTER — Encounter: Payer: Self-pay | Admitting: Emergency Medicine

## 2014-11-03 DIAGNOSIS — G40301 Generalized idiopathic epilepsy and epileptic syndromes, not intractable, with status epilepticus: Secondary | ICD-10-CM | POA: Diagnosis present

## 2014-11-03 DIAGNOSIS — G40901 Epilepsy, unspecified, not intractable, with status epilepticus: Secondary | ICD-10-CM

## 2014-11-03 DIAGNOSIS — F17210 Nicotine dependence, cigarettes, uncomplicated: Secondary | ICD-10-CM | POA: Insufficient documentation

## 2014-11-03 DIAGNOSIS — M1990 Unspecified osteoarthritis, unspecified site: Secondary | ICD-10-CM | POA: Diagnosis not present

## 2014-11-03 DIAGNOSIS — G43909 Migraine, unspecified, not intractable, without status migrainosus: Secondary | ICD-10-CM | POA: Insufficient documentation

## 2014-11-03 DIAGNOSIS — G4730 Sleep apnea, unspecified: Secondary | ICD-10-CM | POA: Insufficient documentation

## 2014-11-03 DIAGNOSIS — J45909 Unspecified asthma, uncomplicated: Secondary | ICD-10-CM | POA: Insufficient documentation

## 2014-11-03 DIAGNOSIS — R0602 Shortness of breath: Secondary | ICD-10-CM | POA: Insufficient documentation

## 2014-11-03 DIAGNOSIS — F329 Major depressive disorder, single episode, unspecified: Secondary | ICD-10-CM | POA: Insufficient documentation

## 2014-11-03 DIAGNOSIS — Z91030 Bee allergy status: Secondary | ICD-10-CM | POA: Diagnosis not present

## 2014-11-03 DIAGNOSIS — Z8673 Personal history of transient ischemic attack (TIA), and cerebral infarction without residual deficits: Secondary | ICD-10-CM | POA: Insufficient documentation

## 2014-11-03 DIAGNOSIS — F419 Anxiety disorder, unspecified: Secondary | ICD-10-CM | POA: Insufficient documentation

## 2014-11-03 DIAGNOSIS — R079 Chest pain, unspecified: Secondary | ICD-10-CM | POA: Insufficient documentation

## 2014-11-03 DIAGNOSIS — Z91013 Allergy to seafood: Secondary | ICD-10-CM | POA: Diagnosis not present

## 2014-11-03 DIAGNOSIS — J439 Emphysema, unspecified: Secondary | ICD-10-CM | POA: Insufficient documentation

## 2014-11-03 DIAGNOSIS — E871 Hypo-osmolality and hyponatremia: Secondary | ICD-10-CM | POA: Insufficient documentation

## 2014-11-03 DIAGNOSIS — G40802 Other epilepsy, not intractable, without status epilepticus: Secondary | ICD-10-CM | POA: Diagnosis not present

## 2014-11-03 DIAGNOSIS — R569 Unspecified convulsions: Secondary | ICD-10-CM | POA: Diagnosis not present

## 2014-11-03 DIAGNOSIS — Z880 Allergy status to penicillin: Secondary | ICD-10-CM | POA: Diagnosis not present

## 2014-11-03 DIAGNOSIS — Z882 Allergy status to sulfonamides status: Secondary | ICD-10-CM | POA: Diagnosis not present

## 2014-11-03 DIAGNOSIS — E876 Hypokalemia: Secondary | ICD-10-CM | POA: Diagnosis not present

## 2014-11-03 DIAGNOSIS — Z79899 Other long term (current) drug therapy: Secondary | ICD-10-CM | POA: Diagnosis not present

## 2014-11-03 LAB — CBC WITH DIFFERENTIAL/PLATELET
BASOS PCT: 1 %
Basophils Absolute: 0.1 10*3/uL (ref 0–0.1)
EOS ABS: 0.1 10*3/uL (ref 0–0.7)
EOS PCT: 1 %
HCT: 38.9 % (ref 35.0–47.0)
HEMOGLOBIN: 13 g/dL (ref 12.0–16.0)
Lymphocytes Relative: 30 %
Lymphs Abs: 2.1 10*3/uL (ref 1.0–3.6)
MCH: 28.7 pg (ref 26.0–34.0)
MCHC: 33.3 g/dL (ref 32.0–36.0)
MCV: 86.3 fL (ref 80.0–100.0)
MONO ABS: 0.6 10*3/uL (ref 0.2–0.9)
MONOS PCT: 8 %
NEUTROS PCT: 60 %
Neutro Abs: 4.2 10*3/uL (ref 1.4–6.5)
PLATELETS: 271 10*3/uL (ref 150–440)
RBC: 4.51 MIL/uL (ref 3.80–5.20)
RDW: 12.5 % (ref 11.5–14.5)
WBC: 7 10*3/uL (ref 3.6–11.0)

## 2014-11-03 LAB — COMPREHENSIVE METABOLIC PANEL
ALBUMIN: 4.1 g/dL (ref 3.5–5.0)
ALT: 15 U/L (ref 14–54)
ANION GAP: 6 (ref 5–15)
AST: 28 U/L (ref 15–41)
Alkaline Phosphatase: 98 U/L (ref 38–126)
BUN: 11 mg/dL (ref 6–20)
CHLORIDE: 106 mmol/L (ref 101–111)
CO2: 22 mmol/L (ref 22–32)
Calcium: 8.7 mg/dL — ABNORMAL LOW (ref 8.9–10.3)
Creatinine, Ser: 0.77 mg/dL (ref 0.44–1.00)
GFR calc non Af Amer: >60 mL/min (ref >60)
GLUCOSE: 94 mg/dL (ref 65–99)
POTASSIUM: 3.3 mmol/L — AB (ref 3.5–5.1)
SODIUM: 134 mmol/L — AB (ref 135–145)
Total Bilirubin: 0.7 mg/dL (ref 0.3–1.2)
Total Protein: 6.9 g/dL (ref 6.5–8.1)

## 2014-11-03 LAB — TROPONIN I: Troponin I: <0.03 ng/mL (ref <0.031)

## 2014-11-03 LAB — PROTIME-INR
INR: 1.03
PROTHROMBIN TIME: 13.7 s (ref 11.4–15.0)

## 2014-11-03 LAB — FIBRIN DERIVATIVES D-DIMER (ARMC ONLY): FIBRIN DERIVATIVES D-DIMER (ARMC): 236 (ref 0–499)

## 2014-11-03 MED ORDER — ACETAMINOPHEN 325 MG PO TABS: 650.0000 mg | ORAL_TABLET | Freq: Four times a day (QID) | ORAL | Status: DC | PRN

## 2014-11-03 MED ORDER — ONDANSETRON HCL 4 MG/2ML IJ SOLN
4.0000 mg | Freq: Four times a day (QID) | INTRAMUSCULAR | Status: DC | PRN
  Administered 2014-11-04 (×2): 4 mg via INTRAVENOUS
  Filled 2014-11-03 (×2): qty 2

## 2014-11-03 MED ORDER — OXYCODONE HCL 5 MG PO TABS
5.0000 mg | ORAL_TABLET | ORAL | Status: DC | PRN
  Administered 2014-11-04 (×3): 5 mg via ORAL
  Filled 2014-11-03 (×3): qty 1

## 2014-11-03 MED ORDER — SODIUM CHLORIDE 0.9 % IV SOLN
INTRAVENOUS | Status: DC
  Administered 2014-11-04: 1000 mL via INTRAVENOUS
  Administered 2014-11-04: 09:00:00 via INTRAVENOUS

## 2014-11-03 MED ORDER — ONDANSETRON HCL 4 MG PO TABS: 4.0000 mg | ORAL_TABLET | Freq: Four times a day (QID) | ORAL | Status: DC | PRN

## 2014-11-03 MED ORDER — POTASSIUM CHLORIDE CRYS ER 20 MEQ PO TBCR
40.0000 meq | EXTENDED_RELEASE_TABLET | Freq: Once | ORAL | Status: AC
  Administered 2014-11-04: 40 meq via ORAL
  Filled 2014-11-03: qty 2

## 2014-11-03 MED ORDER — SODIUM CHLORIDE 0.9 % IV SOLN
1000.0000 mg | Freq: Once | INTRAVENOUS | Status: AC
  Administered 2014-11-03: 1000 mg via INTRAVENOUS
  Filled 2014-11-03 (×2): qty 10

## 2014-11-03 MED ORDER — LORAZEPAM 2 MG/ML IJ SOLN
1.0000 mg | Freq: Once | INTRAMUSCULAR | Status: AC
  Administered 2014-11-03: 1 mg via INTRAVENOUS
  Filled 2014-11-03: qty 1

## 2014-11-03 MED ORDER — SODIUM CHLORIDE 0.9 % IJ SOLN
3.0000 mL | Freq: Two times a day (BID) | INTRAMUSCULAR | Status: DC
  Administered 2014-11-04: 3 mL via INTRAVENOUS

## 2014-11-03 MED ORDER — HEPARIN SODIUM (PORCINE) 5000 UNIT/ML IJ SOLN
5000.0000 [IU] | Freq: Three times a day (TID) | INTRAMUSCULAR | Status: DC
  Administered 2014-11-04 (×3): 5000 [IU] via SUBCUTANEOUS
  Filled 2014-11-03 (×3): qty 1

## 2014-11-03 MED ORDER — ACETAMINOPHEN 650 MG RE SUPP: 650.0000 mg | Freq: Four times a day (QID) | RECTAL | Status: DC | PRN

## 2014-11-03 MED ORDER — MORPHINE SULFATE (PF) 2 MG/ML IV SOLN
2.0000 mg | INTRAVENOUS | Status: DC | PRN
Start: 2014-11-03 — End: 2014-11-04

## 2014-11-03 NOTE — ED Notes (Signed)
Patient left for CT scan. 

## 2014-11-03 NOTE — ED Provider Notes (Signed)
Time Seen: Approximately ----------------------------------------- 10:29 PM on 11/03/2014 -----------------------------------------   I have reviewed the triage notes  Chief Complaint: Chest Pain   History of Present Illness: [name] is a 44 y.o. female who arrives with a chief complaint of chest pain. The patient had a seizure just prior to my evaluation. Once she resolved her post ictal. She states that her chest pain seemed to be sudden onset sharp without radiation to the arm, jaw, neck region. She denies any nausea or vomiting. She states she just felt very concerned and called her husband at work to come and take her to the emergency department. She denies any pleuritic or positional component. And after arrival here denies any chest discomfort at present. She has no cardiac history though has a long history of seizures with previous episodes of status epilepticus. She also has had a previous history of a stroke and COPD.   Past Medical History  Diagnosis Date  . Anxiety   . Arthritis   . Asthma   . Migraines   . Depression   . Emphysema/COPD   . Sleep apnea   . Stroke   . Seizure     Patient Active Problem List   Diagnosis Date Noted  . Seizures 05/17/2014  . Respiratory failure, acute 05/16/2014  . COPD (chronic obstructive pulmonary disease)   . Emphysema/COPD   . Sleep apnea   . Stroke   . Seizure     Past Surgical History  Procedure Laterality Date  . Cesarean section  1993  . Partial hysterectomy  1995  . Abdominal hysterectomy      partial    Past Surgical History  Procedure Laterality Date  . Cesarean section  1993  . Partial hysterectomy  1995  . Abdominal hysterectomy      partial    Current Outpatient Rx  Name  Route  Sig  Dispense  Refill  . acetaminophen (TYLENOL) 325 MG tablet   Oral   Take 325-650 mg by mouth every 6 (six) hours as needed for mild pain or headache.         . albuterol (PROVENTIL HFA;VENTOLIN HFA) 108 (90  BASE) MCG/ACT inhaler   Inhalation   Inhale 1-2 puffs into the lungs every 6 (six) hours as needed for wheezing or shortness of breath.         Marland Kitchen albuterol (PROVENTIL) (2.5 MG/3ML) 0.083% nebulizer solution   Nebulization   Take 2.5 mg by nebulization every 6 (six) hours as needed for wheezing or shortness of breath.         Marland Kitchen aspirin EC 81 MG tablet   Oral   Take 81 mg by mouth daily.         Marland Kitchen EPINEPHrine (EPIPEN 2-PAK) 0.3 mg/0.3 mL IJ SOAJ injection   Intramuscular   Inject 0.3 mg into the muscle once as needed (for severe allergic reaction).         Marland Kitchen FLUoxetine (PROZAC) 20 MG capsule   Oral   Take 20 mg by mouth daily.         Marland Kitchen gabapentin (NEURONTIN) 300 MG capsule   Oral   Take 300 mg by mouth 2 (two) times daily.         . Lacosamide (VIMPAT) 100 MG TABS   Oral   Take 1 tablet (100 mg total) by mouth 2 (two) times daily.   60 tablet   0   . lamoTRIgine (LAMICTAL) 200 MG tablet   Oral  Take 200 mg by mouth 2 (two) times daily.         Marland Kitchen levETIRAcetam (KEPPRA) 750 MG tablet   Oral   Take 1 tablet (750 mg total) by mouth 2 (two) times daily.   60 tablet   0   . topiramate (TOPAMAX) 50 MG tablet   Oral   Take 50 mg by mouth daily.           Allergies:  Bee venom; Shellfish allergy; Contrast media; Penicillins; and Sulfa antibiotics  Family History: History reviewed. No pertinent family history.  Social History: Social History  Substance Use Topics  . Smoking status: Current Every Day Smoker    Types: E-cigarettes  . Smokeless tobacco: Never Used  . Alcohol Use: No     Review of Systems:   10 point review of systems was performed and was otherwise negative:  Constitutional: No fever Eyes: No visual disturbances ENT: No sore throat, ear pain Cardiac: Chest pain was described as substernal and sharp Respiratory: No shortness of breath, wheezing, or stridor Abdomen: No abdominal pain, no vomiting, No diarrhea Endocrine: No  weight loss, No night sweats Extremities: No peripheral edema, cyanosis Skin: No rashes, easy bruising Neurologic: Patient initially on presentation had no recent seizure activity Urologic: No dysuria, Hematuria, or urinary frequency   Physical Exam:  ED Triage Vitals  Enc Vitals Group     BP 11/03/14 1854 124/91 mmHg     Pulse Rate 11/03/14 1854 106     Resp 11/03/14 1854 20     Temp 11/03/14 1854 98.6 F (37 C)     Temp Source 11/03/14 1854 Oral     SpO2 11/03/14 1854 98 %     Weight 11/03/14 1854 153 lb (69.4 kg)     Height 11/03/14 1854 5\' 3"  (1.6 m)     Head Cir --      Peak Flow --      Pain Score 11/03/14 1855 10     Pain Loc --      Pain Edu? --      Excl. in [place]? --     General: Awake , Alert , and Oriented times 3; GCS 15 Head: Normal cephalic , atraumatic Eyes: Pupils equal , round, reactive to light; no dentition Nose/Throat: No nasal drainage, patent upper airway without erythema or exudate.  Neck: Supple, Full range of motion, No anterior adenopathy or palpable thyroid masses Lungs: Clear to ascultation without wheezes , rhonchi, or rales Heart: Regular rate, regular rhythm without murmurs , gallops , or rubs Abdomen: Soft, non tender without rebound, guarding , or rigidity; bowel sounds positive and symmetric in all 4 quadrants. No organomegaly .        Extremities: 2 plus symmetric pulses. No edema, clubbing or cyanosis Neurologic: normal ambulation, Motor symmetric without deficits, sensory intact Skin: warm, dry, no rashes   Labs:   All laboratory work was reviewed including any pertinent negatives or positives listed below:  Labs Reviewed  COMPREHENSIVE METABOLIC PANEL - Abnormal; Notable for the following:    Sodium 134 (*)    Potassium 3.3 (*)    Calcium 8.7 (*)    All other components within normal limits  CBC WITH DIFFERENTIAL/PLATELET  TROPONIN I  FIBRIN DERIVATIVES D-DIMER ([facility] ONLY)  PROTIME-INR    EKG: ED ECG REPORT I, Daymon Larsen, the attending physician, personally viewed and interpreted this ECG.  Date: 11/03/2014 EKG Time: 1856 Rate: 112 Rhythm: Sinus tachycardia QRS Axis: normal  Intervals: normal ST/T Wave abnormalities: Nonspecific ST-T wave abnormality Conduction Disutrbances: none Narrative Interpretation: unremarkable    Radiology:  DG Chest 2 View (Final result) Result time: 11/03/14 22:13:04   Final result by Rad Results In Interface (11/03/14 22:13:04)   Narrative:   CLINICAL DATA: Chest pain, seizure  EXAM: CHEST 2 VIEW  COMPARISON: 05/16/2014  FINDINGS: The heart size and mediastinal contours are within normal limits. Both lungs are clear. The visualized skeletal structures are unremarkable.  IMPRESSION: No active cardiopulmonary disease.   Electronically Signed By: Conchita Paris M.D. On: 11/03/2014 22:13          CT Head Wo Contrast (Final result) Result time: 11/03/14 20:39:12   Final result by Rad Results In Interface (11/03/14 20:39:12)   Narrative:   CLINICAL DATA: Seizure.  EXAM: CT HEAD WITHOUT CONTRAST  TECHNIQUE: Contiguous axial images were obtained from the base of the skull through the vertex without intravenous contrast.  COMPARISON: May 15, 2014  FINDINGS: There is no midline shift, hydrocephalus, or mass. No acute hemorrhage or acute transcortical infarct is identified. The bony calvarium is intact. The visualized sinuses are clear.  IMPRESSION: No acute intracranial abnormality identified.      personally reviewed the radiologic studies   Procedures: None   Critical Care:  CRITICAL CARE Performed by: Daymon Larsen   Total critical care time: 35 minutes Treatment for status epilepticus Critical care time was exclusive of separately billable procedures and treating other patients.  Critical care was necessary to treat or prevent imminent or life-threatening deterioration.  Critical care was time spent  personally by me on the following activities: development of treatment plan with patient and/or surrogate as well as nursing, discussions with consultants, evaluation of patient's response to treatment, examination of patient, obtaining history from patient or surrogate, ordering and performing treatments and interventions, ordering and review of laboratory studies, ordering and review of radiographic studies, pulse oximetry and re-evaluation of patient's condition.    ED Course: Patient presents with some brief generalized seizures that only last a few seconds and then there is a slow postictal. Patient's had approximately 5 of these brief seizures while here in emergency department. She does have a history of status epilepticus and states that she's actually been doing pretty well with her recent course of medication. Her chest pain workup is been benign. Patient's seizure workup here is also benign at this time. I started the patient on a Keppra bolus of 1000 mg. He also received Ativan here in emergency department. I don't feel she requires intubation at this time    Assessment: Status epilepticus Acute unspecified chest pain   Final Clinical Impression: Status epilepticus Acute unspecified chest pain Final diagnoses:  None     Plan: Patient's case was reviewed with the hospitalist, further disposition and management depends upon her evaluation          Go to top  Daymon Larsen, MD 11/03/14 2231

## 2014-11-03 NOTE — ED Notes (Signed)
Pt ambulated to the bed side commode to urinate. Pt returned to bed without difficulty.

## 2014-11-03 NOTE — ED Notes (Signed)
Patient was sitting on a chair on the side of the bed, using her cell phone, dressed in sweat suit that her husband brought. Husband at bedside. Patient states she got dressed due to having been incontinent in the bed. This writer cleaned the bed, gave the patient a new gown that her husband helped her change in to, and gave warm blankets. Pharmacy was called again for the [place]. No signs of distress noted.

## 2014-11-03 NOTE — ED Notes (Signed)
Patient is alert and oriented, appears more comfortable, laughing with husband. Patient reports slight relief of nausea.

## 2014-11-03 NOTE — ED Notes (Addendum)
Patient c/o smelling "alcohol" and states that is an aura prior to having a seizure. Patient had a brief tonic-clonic seizure involving upper extremities only. Patient was post-ictal immediately afterwards. Patient had a heart rate of 36bpm immediately, which increased to 48bpm within a few seconds. Dr. Marcelene Butte at bedside. Patient was incontinent.

## 2014-11-03 NOTE — ED Notes (Signed)
Reports chest pain, headache, sob and n/v onset this am.

## 2014-11-04 ENCOUNTER — Encounter: Payer: Self-pay | Admitting: Internal Medicine

## 2014-11-04 DIAGNOSIS — J45909 Unspecified asthma, uncomplicated: Secondary | ICD-10-CM | POA: Diagnosis not present

## 2014-11-04 DIAGNOSIS — R569 Unspecified convulsions: Secondary | ICD-10-CM | POA: Diagnosis not present

## 2014-11-04 DIAGNOSIS — R079 Chest pain, unspecified: Secondary | ICD-10-CM | POA: Diagnosis not present

## 2014-11-04 DIAGNOSIS — M1990 Unspecified osteoarthritis, unspecified site: Secondary | ICD-10-CM | POA: Diagnosis not present

## 2014-11-04 DIAGNOSIS — J439 Emphysema, unspecified: Secondary | ICD-10-CM | POA: Diagnosis not present

## 2014-11-04 DIAGNOSIS — E876 Hypokalemia: Secondary | ICD-10-CM | POA: Diagnosis not present

## 2014-11-04 DIAGNOSIS — Z79899 Other long term (current) drug therapy: Secondary | ICD-10-CM | POA: Diagnosis not present

## 2014-11-04 DIAGNOSIS — E871 Hypo-osmolality and hyponatremia: Secondary | ICD-10-CM | POA: Diagnosis not present

## 2014-11-04 DIAGNOSIS — F419 Anxiety disorder, unspecified: Secondary | ICD-10-CM | POA: Diagnosis not present

## 2014-11-04 DIAGNOSIS — G40802 Other epilepsy, not intractable, without status epilepticus: Secondary | ICD-10-CM | POA: Diagnosis not present

## 2014-11-04 DIAGNOSIS — G43909 Migraine, unspecified, not intractable, without status migrainosus: Secondary | ICD-10-CM | POA: Diagnosis not present

## 2014-11-04 DIAGNOSIS — F329 Major depressive disorder, single episode, unspecified: Secondary | ICD-10-CM | POA: Diagnosis not present

## 2014-11-04 LAB — CBC
HCT: 38.4 % (ref 35.0–47.0)
HEMATOCRIT: 38.3 % (ref 35.0–47.0)
HEMOGLOBIN: 12.5 g/dL (ref 12.0–16.0)
HEMOGLOBIN: 12.9 g/dL (ref 12.0–16.0)
MCH: 28.5 pg (ref 26.0–34.0)
MCH: 28.8 pg (ref 26.0–34.0)
MCHC: 32.6 g/dL (ref 32.0–36.0)
MCHC: 33.6 g/dL (ref 32.0–36.0)
MCV: 85.6 fL (ref 80.0–100.0)
MCV: 87.3 fL (ref 80.0–100.0)
Platelets: 265 10*3/uL (ref 150–440)
Platelets: 273 10*3/uL (ref 150–440)
RBC: 4.4 MIL/uL (ref 3.80–5.20)
RBC: 4.47 MIL/uL (ref 3.80–5.20)
RDW: 12.5 % (ref 11.5–14.5)
RDW: 12.5 % (ref 11.5–14.5)
WBC: 6 10*3/uL (ref 3.6–11.0)
WBC: 7.5 10*3/uL (ref 3.6–11.0)

## 2014-11-04 LAB — CREATININE, SERUM: CREATININE: 0.84 mg/dL (ref 0.44–1.00)

## 2014-11-04 LAB — TROPONIN I: Troponin I: <0.03 ng/mL (ref <0.031)

## 2014-11-04 LAB — BASIC METABOLIC PANEL
ANION GAP: 2 — AB (ref 5–15)
BUN: 12 mg/dL (ref 6–20)
CALCIUM: 9 mg/dL (ref 8.9–10.3)
CO2: 29 mmol/L (ref 22–32)
Chloride: 113 mmol/L — ABNORMAL HIGH (ref 101–111)
Creatinine, Ser: 0.82 mg/dL (ref 0.44–1.00)
Glucose, Bld: 71 mg/dL (ref 65–99)
Potassium: 4 mmol/L (ref 3.5–5.1)
Sodium: 144 mmol/L (ref 135–145)

## 2014-11-04 MED ORDER — LAMOTRIGINE 100 MG PO TABS
200.0000 mg | ORAL_TABLET | Freq: Two times a day (BID) | ORAL | Status: DC
  Filled 2014-11-04 (×2): qty 2

## 2014-11-04 MED ORDER — TOPIRAMATE 100 MG PO TABS
50.0000 mg | ORAL_TABLET | Freq: Every day | ORAL | Status: DC
  Administered 2014-11-04: 50 mg via ORAL
  Filled 2014-11-04: qty 1

## 2014-11-04 MED ORDER — FLUOXETINE HCL 20 MG PO CAPS
20.0000 mg | ORAL_CAPSULE | Freq: Every day | ORAL | Status: DC
  Administered 2014-11-04: 20 mg via ORAL
  Filled 2014-11-04: qty 1

## 2014-11-04 MED ORDER — LAMOTRIGINE 25 MG PO TABS: 50.0000 mg | ORAL_TABLET | Freq: Two times a day (BID) | ORAL | Status: DC

## 2014-11-04 MED ORDER — GABAPENTIN 300 MG PO CAPS
300.0000 mg | ORAL_CAPSULE | Freq: Two times a day (BID) | ORAL | Status: DC
  Administered 2014-11-04: 300 mg via ORAL
  Filled 2014-11-04: qty 1

## 2014-11-04 MED ORDER — LEVETIRACETAM 750 MG PO TABS
750.0000 mg | ORAL_TABLET | Freq: Two times a day (BID) | ORAL | Status: DC
  Administered 2014-11-04: 750 mg via ORAL
  Filled 2014-11-04 (×3): qty 1

## 2014-11-04 MED ORDER — ASPIRIN EC 81 MG PO TBEC
81.0000 mg | DELAYED_RELEASE_TABLET | Freq: Every day | ORAL | Status: DC
  Administered 2014-11-04: 81 mg via ORAL
  Filled 2014-11-04: qty 1

## 2014-11-04 MED ORDER — LACOSAMIDE 100 MG PO TABS: 150.0000 mg | ORAL_TABLET | Freq: Two times a day (BID) | ORAL | Status: DC

## 2014-11-04 MED ORDER — LACOSAMIDE 50 MG PO TABS
100.0000 mg | ORAL_TABLET | Freq: Two times a day (BID) | ORAL | Status: DC
  Administered 2014-11-04: 100 mg via ORAL
  Filled 2014-11-04: qty 2

## 2014-11-04 MED ORDER — ALBUTEROL SULFATE (2.5 MG/3ML) 0.083% IN NEBU: 3.0000 mL | INHALATION_SOLUTION | Freq: Four times a day (QID) | RESPIRATORY_TRACT | Status: DC | PRN

## 2014-11-04 NOTE — Discharge Summary (Signed)
[place] [facility] [place] NAME: [name]    MR#:  [id number]  DATE OF BIRTH:  [birth year]  DATE OF ADMISSION:  11/03/2014 ADMITTING PHYSICIAN: Lytle Butte, MD  DATE OF DISCHARGE: 11/04/2014  PRIMARY CARE PHYSICIAN: Lavera Guise, MD    ADMISSION DIAGNOSIS:  Status epilepticus [G40.301]  DISCHARGE DIAGNOSIS:  Active Problems:   Seizure  pseudoseizure  SECONDARY DIAGNOSIS:   Past Medical History  Diagnosis Date  . Anxiety   . Arthritis   . Asthma   . Migraines   . Depression   . Emphysema/COPD   . Sleep apnea   . Stroke   . Seizure     HOSPITAL COURSE:  This is a 44 year old female with a history of seizure disorder anxiety who presents with "seizure". For further details please refer the H&P.  1. Seizure versus pseudoseizure: Patient was seen and evaluated by neurology. Patient has been seeing her neurologist Dr. Melrose Nakayama who feels that patient may have underlying pseudoseizures as well as true seizure. Recommendations as per Dr. Tamala Julian is to increase Vimpat to 150 mg by mouth twice a day. Restart Lamictal at 50 by mouth twice a day 1 week and 25 mg twice a day 1 week then off. Patient continue on Keppra and Neurontin. No driving or operating heavy machinery for 6 months. Patient will need a follow-up with Dr. Melrose Nakayama in approximately 2-6 weeks. I also consulted psychiatry due to pseudoseizures and anxiety. 2. Chest pain: This does not appear to be cardiac in nature. Troponins were negative as well as telemetry. Patient had no chest pain while in the hospital.   3. Hypokalemia: Potassium has been repleted.  4. Hyponatremia: Sodium level is improved.  DISCHARGE CONDITIONS AND DIET:  Patient is being discharged home in stable condition on a heart healthy diet  CONSULTS OBTAINED:  Treatment Team:  Lytle Butte, MD Gonzella Lex, MD  DRUG ALLERGIES:   Allergies  Allergen Reactions  . Bee Venom Anaphylaxis  .  Shellfish Allergy Anaphylaxis  . Contrast Media [Iodinated Diagnostic Agents] Other (See Comments)    Reaction:  Unknown   . Penicillins Other (See Comments)    Reaction:  Seizures   . Sulfa Antibiotics Itching    DISCHARGE MEDICATIONS:   Current Discharge Medication List    CONTINUE these medications which have CHANGED   Details  Lacosamide (VIMPAT) 100 MG TABS Take 1.5 tablets (150 mg total) by mouth 2 (two) times daily. Qty: 60 tablet, Refills: 0    lamoTRIgine (LAMICTAL) 25 MG tablet Take 2 tablets (50 mg total) by mouth 2 (two) times daily. Qty: 28 tablet, Refills: 0      CONTINUE these medications which have NOT CHANGED   Details  acetaminophen (TYLENOL) 325 MG tablet Take 325-650 mg by mouth every 6 (six) hours as needed for mild pain or headache.    albuterol (PROVENTIL HFA;VENTOLIN HFA) 108 (90 BASE) MCG/ACT inhaler Inhale 1-2 puffs into the lungs every 6 (six) hours as needed for wheezing or shortness of breath.    albuterol (PROVENTIL) (2.5 MG/3ML) 0.083% nebulizer solution Take 2.5 mg by nebulization every 6 (six) hours as needed for wheezing or shortness of breath.    aspirin EC 81 MG tablet Take 81 mg by mouth daily.    EPINEPHrine (EPIPEN 2-PAK) 0.3 mg/0.3 mL IJ SOAJ injection Inject 0.3 mg into the muscle once as needed (for severe allergic reaction).    FLUoxetine (PROZAC) 20 MG capsule  Take 20 mg by mouth daily.    gabapentin (NEURONTIN) 300 MG capsule Take 300 mg by mouth 2 (two) times daily.    levETIRAcetam (KEPPRA) 750 MG tablet Take 1 tablet (750 mg total) by mouth 2 (two) times daily. Qty: 60 tablet, Refills: 0    topiramate (TOPAMAX) 50 MG tablet Take 50 mg by mouth daily.              Today   CHIEF COMPLAINT:  No acute issues. Patient reports that she may have had a seizure overnight however nurse reports that there is no evidence of seizure. Patient did urinate on herself but this is not related to seizure.   VITAL SIGNS:  Blood  pressure 95/61, pulse 85, temperature 98.1 F (36.7 C), temperature source Oral, resp. rate 16, height 5\' 3"  (1.6 m), weight 69.4 kg (153 lb), SpO2 97 %.   REVIEW OF SYSTEMS:  Review of Systems  Constitutional: Negative for fever, chills and malaise/fatigue.  HENT: Negative for sore throat.   Eyes: Negative for blurred vision.  Respiratory: Negative for cough, hemoptysis, shortness of breath and wheezing.   Cardiovascular: Negative for chest pain, palpitations and leg swelling.  Gastrointestinal: Negative for nausea, vomiting, abdominal pain, diarrhea and blood in stool.  Genitourinary: Negative for dysuria.  Musculoskeletal: Negative for back pain.  Neurological: Negative for dizziness, tremors and headaches.  Endo/Heme/Allergies: Does not bruise/bleed easily.  Psychiatric/Behavioral: The patient is not nervous/anxious.      PHYSICAL EXAMINATION:  GENERAL:  44 y.o.-year-old patient lying in the bed with no acute distress.  NECK:  Supple, no jugular venous distention. No thyroid enlargement, no tenderness.  LUNGS: Normal breath sounds bilaterally, no wheezing, rales,rhonchi  No use of accessory muscles of respiration.  CARDIOVASCULAR: S1, S2 normal. No murmurs, rubs, or gallops.  ABDOMEN: Soft, non-tender, non-distended. Bowel sounds present. No organomegaly or mass.  EXTREMITIES: No pedal edema, cyanosis, or clubbing.  PSYCHIATRIC: The patient is alert and oriented x 3.  SKIN: No obvious rash, lesion, or ulcer.   DATA REVIEW:   CBC  Recent Labs Lab 11/04/14 0423  WBC 6.0  HGB 12.5  HCT 38.4  PLT 273    Chemistries   Recent Labs Lab 11/03/14 1952  11/04/14 0423  NA 134*  --  144  K 3.3*  --  4.0  CL 106  --  113*  CO2 22  --  29  GLUCOSE 94  --  71  BUN 11  --  12  CREATININE 0.77  < > 0.82  CALCIUM 8.7*  --  9.0  AST 28  --   --   ALT 15  --   --   ALKPHOS 98  --   --   BILITOT 0.7  --   --   < > = values in this interval not displayed.  Cardiac  Enzymes  Recent Labs Lab 11/03/14 1952 11/04/14 0002 11/04/14 0653  TROPONINI <0.03 <0.03 <0.03    Microbiology Results  @[id number] @  RADIOLOGY:  Dg Chest 2 View  11/03/2014   CLINICAL DATA:  Chest pain, seizure  EXAM: CHEST  2 VIEW  COMPARISON:  05/16/2014  FINDINGS: The heart size and mediastinal contours are within normal limits. Both lungs are clear. The visualized skeletal structures are unremarkable.  IMPRESSION: No active cardiopulmonary disease.   Electronically Signed   By: Conchita Paris M.D.   On: 11/03/2014 22:13   Ct Head Wo Contrast  11/03/2014   CLINICAL DATA:  Seizure.  EXAM: CT HEAD WITHOUT CONTRAST  TECHNIQUE: Contiguous axial images were obtained from the base of the skull through the vertex without intravenous contrast.  COMPARISON:  May 15, 2014  FINDINGS: There is no midline shift, hydrocephalus, or mass. No acute hemorrhage or acute transcortical infarct is identified. The bony calvarium is intact. The visualized sinuses are clear.  IMPRESSION: No acute intracranial abnormality identified.   Electronically Signed   By: Abelardo Diesel M.D.   On: 11/03/2014 20:39      Management plans discussed with the patient and she is in agreement. Stable for discharge home  Patient should follow up with Dr. Melrose Nakayama  CODE STATUS:     Code Status Orders        Start     Ordered   11/03/14 2320  Full code   Continuous     11/03/14 2320      TOTAL TIME TAKING CARE OF THIS PATIENT: 35 minutes.    Brodi Kari M.D on 11/04/2014 at 12:28 PM  Between 7am to 6pm - Pager - [phone] After 6pm go to [email or web address] - password [email or web address] [facility]  Office  [phone]  CC: Primary care physician; Lavera Guise, MD

## 2014-11-04 NOTE — Progress Notes (Signed)
Notified from ICU that HR was elevated. Checked on pt and she was complaining of a headache. Medication given to pt.

## 2014-11-04 NOTE — H&P (Signed)
[facility] [place] NAME: [name]    MR#:  [id number]  DATE OF BIRTH:  Aug 28, 1970   DATE OF ADMISSION:  11/03/2014  PRIMARY CARE PHYSICIAN: Lavera Guise, MD   REQUESTING/REFERRING PHYSICIAN: Marcelene Butte  CHIEF COMPLAINT:   Chief Complaint  Patient presents with  . Chest Pain   seizure  HISTORY OF PRESENT ILLNESS:  [name]  is a 44 y.o. female with a known history of seizure disorder, COPD non-oxygen dependent presenting with headache/chest pain. She describes one day duration chest pain retrosternal radiating to her left jaw described only as "pain" 8/10 radiating as above no relieving or worsening factors. Associated nausea as well as headache. She subsequently had a seizure described as tonic/clonic. With urinary incontinence followed by postictal state. Last seizure approximately one week ago-follows with Dr. Melrose Nakayama of neurology  PAST MEDICAL HISTORY:   Past Medical History  Diagnosis Date  . Anxiety   . Arthritis   . Asthma   . Migraines   . Depression   . Emphysema/COPD   . Sleep apnea   . Stroke   . Seizure     PAST SURGICAL HISTORY:   Past Surgical History  Procedure Laterality Date  . Cesarean section  1993  . Partial hysterectomy  1995  . Abdominal hysterectomy      partial    SOCIAL HISTORY:   Social History  Substance Use Topics  . Smoking status: Current Every Day Smoker    Types: E-cigarettes  . Smokeless tobacco: Never Used  . Alcohol Use: No    FAMILY HISTORY:   Family History  Problem Relation Age of Onset  . Diabetes Neg Hx     DRUG ALLERGIES:   Allergies  Allergen Reactions  . Bee Venom Anaphylaxis  . Shellfish Allergy Anaphylaxis  . Contrast Media [Iodinated Diagnostic Agents] Other (See Comments)    Reaction:  Unknown   . Penicillins Other (See Comments)    Reaction:  Seizures   . Sulfa Antibiotics Itching    REVIEW OF SYSTEMS:  REVIEW OF SYSTEMS:   CONSTITUTIONAL: Denies fevers, chills, fatigue, weakness.  EYES: Denies blurred vision, double vision, or eye pain.  EARS, NOSE, THROAT: Denies tinnitus, ear pain, hearing loss.  RESPIRATORY: denies cough, shortness of breath, wheezing  CARDIOVASCULAR: Positive chest pain, denies palpitations, edema.  GASTROINTESTINAL: Positive nausea, denies vomiting, diarrhea, abdominal pain.  GENITOURINARY: Denies dysuria, hematuria.  ENDOCRINE: Denies nocturia or thyroid problems. HEMATOLOGIC AND LYMPHATIC: Denies easy bruising or bleeding.  SKIN: Denies rash or lesions.  MUSCULOSKELETAL: Denies pain in neck, back, shoulder, knees, hips, or further arthritic symptoms.  NEUROLOGIC: Denies paralysis, paresthesias.  PSYCHIATRIC: Denies anxiety or depressive symptoms. Otherwise full review of systems performed by me is negative.   MEDICATIONS AT HOME:   Prior to Admission medications   Medication Sig Start Date End Date Taking? Authorizing Provider  acetaminophen (TYLENOL) 325 MG tablet Take 325-650 mg by mouth every 6 (six) hours as needed for mild pain or headache.   Yes Historical Provider, MD  albuterol (PROVENTIL HFA;VENTOLIN HFA) 108 (90 BASE) MCG/ACT inhaler Inhale 1-2 puffs into the lungs every 6 (six) hours as needed for wheezing or shortness of breath.   Yes Historical Provider, MD  albuterol (PROVENTIL) (2.5 MG/3ML) 0.083% nebulizer solution Take 2.5 mg by nebulization every 6 (six) hours as needed for wheezing or shortness of breath.   Yes Historical Provider, MD  aspirin EC 81 MG tablet Take 81  mg by mouth daily.   Yes Historical Provider, MD  EPINEPHrine (EPIPEN 2-PAK) 0.3 mg/0.3 mL IJ SOAJ injection Inject 0.3 mg into the muscle once as needed (for severe allergic reaction).   Yes Historical Provider, MD  FLUoxetine (PROZAC) 20 MG capsule Take 20 mg by mouth daily.   Yes Historical Provider, MD  gabapentin (NEURONTIN) 300 MG capsule Take 300 mg by mouth 2 (two) times daily.   Yes Historical  Provider, MD  Lacosamide (VIMPAT) 100 MG TABS Take 1 tablet (100 mg total) by mouth 2 (two) times daily. 05/17/14  Yes Amie Portland, MD  lamoTRIgine (LAMICTAL) 200 MG tablet Take 200 mg by mouth 2 (two) times daily.   Yes Historical Provider, MD  levETIRAcetam (KEPPRA) 750 MG tablet Take 1 tablet (750 mg total) by mouth 2 (two) times daily. 05/17/14  Yes Amie Portland, MD  topiramate (TOPAMAX) 50 MG tablet Take 50 mg by mouth daily.   Yes Historical Provider, MD      VITAL SIGNS:  Blood pressure 97/79, pulse 83, temperature 98.6 F (37 C), temperature source Oral, resp. rate 18, height 5\' 3"  (1.6 m), weight 153 lb (69.4 kg), SpO2 98 %.  PHYSICAL EXAMINATION:  VITAL SIGNS: Filed Vitals:   11/03/14 2347  BP: 97/79  Pulse: 83  Temp:   Resp: 36   GENERAL:44 y.o.female currently in no acute distress.  HEAD: Normocephalic, atraumatic.  EYES: Pupils equal, round, reactive to light. Extraocular muscles intact. No scleral icterus.  MOUTH: Moist mucosal membrane. Dentition poor. No abscess noted.  EAR, NOSE, THROAT: Clear without exudates. No external lesions.  NECK: Supple. No thyromegaly. No nodules. No JVD.  PULMONARY: Clear to ascultation, without wheeze rails or rhonci. No use of accessory muscles, Good respiratory effort. good air entry bilaterally CHEST: Nontender to palpation.  CARDIOVASCULAR: S1 and S2. Regular rate and rhythm. No murmurs, rubs, or gallops. No edema. Pedal pulses 2+ bilaterally.  GASTROINTESTINAL: Soft, nontender, nondistended. No masses. Positive bowel sounds. No hepatosplenomegaly.  MUSCULOSKELETAL: No swelling, clubbing, or edema. Range of motion full in all extremities.  NEUROLOGIC: Cranial nerves II through XII are intact. No gross focal neurological deficits. Sensation intact. Reflexes intact.  SKIN: No ulceration, lesions, rashes, or cyanosis. Skin warm and dry. Turgor intact.  PSYCHIATRIC: Mood, affect within normal limits. The patient is awake, alert  and oriented x 3. Insight, judgment intact.    LABORATORY PANEL:   CBC  Recent Labs Lab 11/03/14 1952  WBC 7.0  HGB 13.0  HCT 38.9  PLT 271   ------------------------------------------------------------------------------------------------------------------  Chemistries   Recent Labs Lab 11/03/14 1952  NA 134*  K 3.3*  CL 106  CO2 22  GLUCOSE 94  BUN 11  CREATININE 0.77  CALCIUM 8.7*  AST 28  ALT 15  ALKPHOS 98  BILITOT 0.7   ------------------------------------------------------------------------------------------------------------------  Cardiac Enzymes  Recent Labs Lab 11/03/14 1952  TROPONINI <0.03   ------------------------------------------------------------------------------------------------------------------  RADIOLOGY:  Dg Chest 2 View  11/03/2014   CLINICAL DATA:  Chest pain, seizure  EXAM: CHEST  2 VIEW  COMPARISON:  05/16/2014  FINDINGS: The heart size and mediastinal contours are within normal limits. Both lungs are clear. The visualized skeletal structures are unremarkable.  IMPRESSION: No active cardiopulmonary disease.   Electronically Signed   By: Conchita Paris M.D.   On: 11/03/2014 22:13   Ct Head Wo Contrast  11/03/2014   CLINICAL DATA:  Seizure.  EXAM: CT HEAD WITHOUT CONTRAST  TECHNIQUE: Contiguous axial images were obtained from  the base of the skull through the vertex without intravenous contrast.  COMPARISON:  May 15, 2014  FINDINGS: There is no midline shift, hydrocephalus, or mass. No acute hemorrhage or acute transcortical infarct is identified. The bony calvarium is intact. The visualized sinuses are clear.  IMPRESSION: No acute intracranial abnormality identified.   Electronically Signed   By: Abelardo Diesel M.D.   On: 11/03/2014 20:39    EKG:   Orders placed or performed in visit on 11/09/13  . EKG 12-Lead    IMPRESSION AND PLAN:   44 year old Caucasian female history of seizure disorder presented with chest pain and  seizure. 1. Seizure: She received Keppra load as well as IV Ativan in emergency department, check levels of her home medications, consult neurology, seizure precautions 2. Chest pain, retrosternal: Initial workup negative, place on telemetry trend cardiac enzymes 3. Hypo-kalemia: Replace potassium to goal 4-5 4. Hyponatremia: IV fluid hydration with normal saline, follow sodium 5. Venous thromboembolism prophylactic: Heparin subcutaneous    All the records are reviewed and case discussed with ED provider. Management plans discussed with the patient, family and they are in agreement.  CODE STATUS: Full  TOTAL TIME TAKING CARE OF THIS PATIENT: 35 minutes.    Hower,  Karenann Cai.D on 11/04/2014 at 12:00 AM  Between 7am to 6pm - Pager - [phone]  After 6pm: House Pager: - [facility]  Office  [phone]  CC: Primary care physician; Lavera Guise, MD

## 2014-11-04 NOTE — Discharge Instructions (Addendum)
Increase Vimpat to 150mg  BID - Restart Lamictal at 50mg  BID x 1 week then 25mg  BID x 1 week then off - Continue Keppra 750mg  BID and Neurotin 300mg  BID - No driving or operating heavy machinery x 6 months - Will sign off, please call with questions - Needs to f/u with [name] in 4-6 weeks or soon if more problems occur

## 2014-11-04 NOTE — Consult Note (Signed)
Reason for Consult: seizure Referring Physician: Dr. Rochele Raring is an 44 y.o. female.  HPI:  44 yo RHD F with hx of seizures followed by Dr. Malvin Johns presents to [facility] with chest pain and then had an apparent seizure.  She states that she does not remember last episode but seems to have them associated with stress.  She follows Dr. Malvin Johns who has been weaning her medication since she has had two long term studies which dont show epilepsy.  She feels as if he does not believe her.  She has had these seizures for years and no medications can control them.  Past Medical History  Diagnosis Date  . Anxiety   . Arthritis   . Asthma   . Migraines   . Depression   . Emphysema/COPD   . Sleep apnea   . Stroke   . Seizure     Past Surgical History  Procedure Laterality Date  . Cesarean section  1993  . Partial hysterectomy  1995  . Abdominal hysterectomy      partial    Family History  Problem Relation Age of Onset  . Diabetes Neg Hx     Social History:  reports that she has been smoking E-cigarettes.  She has never used smokeless tobacco. She reports that she does not drink alcohol or use illicit drugs.  Allergies:  Allergies  Allergen Reactions  . Bee Venom Anaphylaxis  . Shellfish Allergy Anaphylaxis  . Contrast Media [Iodinated Diagnostic Agents] Other (See Comments)    Reaction:  Unknown   . Penicillins Other (See Comments)    Reaction:  Seizures   . Sulfa Antibiotics Itching    Medications: personally reviewed by me  Results for orders placed or performed during the hospital encounter of 11/03/14 (from the past 48 hour(s))  CBC with Differential/Platelet     Status: None   Collection Time: 11/03/14  7:52 PM  Result Value Ref Range   WBC 7.0 3.6 - 11.0 K/uL   RBC 4.51 3.80 - 5.20 MIL/uL   Hemoglobin 13.0 12.0 - 16.0 g/dL   HCT 19.3 59.1 - 26.1 %   MCV 86.3 80.0 - 100.0 fL   MCH 28.7 26.0 - 34.0 pg   MCHC 33.3 32.0 - 36.0 g/dL   RDW 34.3 30.8 - 36.7 %   Platelets 271 150 - 440 K/uL   Neutrophils Relative % 60 %   Neutro Abs 4.2 1.4 - 6.5 K/uL   Lymphocytes Relative 30 %   Lymphs Abs 2.1 1.0 - 3.6 K/uL   Monocytes Relative 8 %   Monocytes Absolute 0.6 0.2 - 0.9 K/uL   Eosinophils Relative 1 %   Eosinophils Absolute 0.1 0 - 0.7 K/uL   Basophils Relative 1 %   Basophils Absolute 0.1 0 - 0.1 K/uL  Comprehensive metabolic panel     Status: Abnormal   Collection Time: 11/03/14  7:52 PM  Result Value Ref Range   Sodium 134 (L) 135 - 145 mmol/L   Potassium 3.3 (L) 3.5 - 5.1 mmol/L   Chloride 106 101 - 111 mmol/L   CO2 22 22 - 32 mmol/L   Glucose, Bld 94 65 - 99 mg/dL   BUN 11 6 - 20 mg/dL   Creatinine, Ser 0.73 0.44 - 1.00 mg/dL   Calcium 8.7 (L) 8.9 - 10.3 mg/dL   Total Protein 6.9 6.5 - 8.1 g/dL   Albumin 4.1 3.5 - 5.0 g/dL   AST 28 15 - 41 U/L  ALT 15 14 - 54 U/L   Alkaline Phosphatase 98 38 - 126 U/L   Total Bilirubin 0.7 0.3 - 1.2 mg/dL   GFR calc non Af Amer >60 >60 mL/min   GFR calc Af Amer >60 >60 mL/min    Comment: (NOTE) The eGFR has been calculated using the CKD EPI equation. This calculation has not been validated in all clinical situations. eGFR's persistently <60 mL/min signify possible Chronic Kidney Disease.    Anion gap 6 5 - 15  Troponin I     Status: None   Collection Time: 11/03/14  7:52 PM  Result Value Ref Range   Troponin I <0.03 <0.031 ng/mL    Comment:        NO INDICATION OF MYOCARDIAL INJURY.   Fibrin derivatives D-Dimer ([facility] only)     Status: None   Collection Time: 11/03/14  7:52 PM  Result Value Ref Range   Fibrin derivatives D-dimer ([facility]) 236 0 - 499  Protime-INR     Status: None   Collection Time: 11/03/14  7:52 PM  Result Value Ref Range   Prothrombin Time 13.7 11.4 - 15.0 seconds   INR 1.03   CBC     Status: None   Collection Time: 11/04/14 12:02 AM  Result Value Ref Range   WBC 7.5 3.6 - 11.0 K/uL   RBC 4.47 3.80 - 5.20 MIL/uL   Hemoglobin 12.9 12.0 - 16.0 g/dL   HCT 38.3 35.0  - 47.0 %   MCV 85.6 80.0 - 100.0 fL   MCH 28.8 26.0 - 34.0 pg   MCHC 33.6 32.0 - 36.0 g/dL   RDW 12.5 11.5 - 14.5 %   Platelets 265 150 - 440 K/uL  Creatinine, serum     Status: None   Collection Time: 11/04/14 12:02 AM  Result Value Ref Range   Creatinine, Ser 0.84 0.44 - 1.00 mg/dL   GFR calc non Af Amer >60 >60 mL/min   GFR calc Af Amer >60 >60 mL/min    Comment: (NOTE) The eGFR has been calculated using the CKD EPI equation. This calculation has not been validated in all clinical situations. eGFR's persistently <60 mL/min signify possible Chronic Kidney Disease.   Troponin I (q 6hr x 3)     Status: None   Collection Time: 11/04/14 12:02 AM  Result Value Ref Range   Troponin I <0.03 <0.031 ng/mL    Comment:        NO INDICATION OF MYOCARDIAL INJURY.   Basic metabolic panel     Status: Abnormal   Collection Time: 11/04/14  4:23 AM  Result Value Ref Range   Sodium 144 135 - 145 mmol/L   Potassium 4.0 3.5 - 5.1 mmol/L   Chloride 113 (H) 101 - 111 mmol/L   CO2 29 22 - 32 mmol/L   Glucose, Bld 71 65 - 99 mg/dL   BUN 12 6 - 20 mg/dL   Creatinine, Ser 0.82 0.44 - 1.00 mg/dL   Calcium 9.0 8.9 - 10.3 mg/dL   GFR calc non Af Amer >60 >60 mL/min   GFR calc Af Amer >60 >60 mL/min    Comment: (NOTE) The eGFR has been calculated using the CKD EPI equation. This calculation has not been validated in all clinical situations. eGFR's persistently <60 mL/min signify possible Chronic Kidney Disease.    Anion gap 2 (L) 5 - 15  CBC     Status: None   Collection Time: 11/04/14  4:23 AM  Result Value  Ref Range   WBC 6.0 3.6 - 11.0 K/uL   RBC 4.40 3.80 - 5.20 MIL/uL   Hemoglobin 12.5 12.0 - 16.0 g/dL   HCT 38.4 35.0 - 47.0 %   MCV 87.3 80.0 - 100.0 fL   MCH 28.5 26.0 - 34.0 pg   MCHC 32.6 32.0 - 36.0 g/dL   RDW 12.5 11.5 - 14.5 %   Platelets 273 150 - 440 K/uL  Troponin I (q 6hr x 3)     Status: None   Collection Time: 11/04/14  6:53 AM  Result Value Ref Range   Troponin I  <0.03 <0.031 ng/mL    Comment:        NO INDICATION OF MYOCARDIAL INJURY.     Dg Chest 2 View  11/03/2014   CLINICAL DATA:  Chest pain, seizure  EXAM: CHEST  2 VIEW  COMPARISON:  05/16/2014  FINDINGS: The heart size and mediastinal contours are within normal limits. Both lungs are clear. The visualized skeletal structures are unremarkable.  IMPRESSION: No active cardiopulmonary disease.   Electronically Signed   By: Conchita Paris M.D.   On: 11/03/2014 22:13   Ct Head Wo Contrast  11/03/2014   CLINICAL DATA:  Seizure.  EXAM: CT HEAD WITHOUT CONTRAST  TECHNIQUE: Contiguous axial images were obtained from the base of the skull through the vertex without intravenous contrast.  COMPARISON:  May 15, 2014  FINDINGS: There is no midline shift, hydrocephalus, or mass. No acute hemorrhage or acute transcortical infarct is identified. The bony calvarium is intact. The visualized sinuses are clear.  IMPRESSION: No acute intracranial abnormality identified.   Electronically Signed   By: Abelardo Diesel M.D.   On: 11/03/2014 20:39    Review of Systems  Constitutional: Positive for fever. Negative for chills, weight loss, malaise/fatigue and diaphoresis.  HENT: Negative for ear discharge, ear pain, hearing loss and tinnitus.   Eyes: Negative.   Respiratory: Negative.   Cardiovascular: Positive for chest pain.  Gastrointestinal: Positive for abdominal pain. Negative for heartburn, nausea, vomiting and diarrhea.  Genitourinary: Positive for dysuria and flank pain. Negative for urgency and hematuria.  Musculoskeletal: Positive for myalgias, back pain and neck pain. Negative for joint pain and falls.  Skin: Negative.   Neurological: Positive for seizures, loss of consciousness and headaches. Negative for dizziness, tingling, tremors, sensory change, speech change, focal weakness and weakness.  Endo/Heme/Allergies: Negative.   Psychiatric/Behavioral: The patient is nervous/anxious.    Blood pressure 95/61,  pulse 85, temperature 98.1 F (36.7 C), temperature source Oral, resp. rate 16, height $RemoveBe'5\' 3"'uOhAJtApU$  (1.6 m), weight 69.4 kg (153 lb), SpO2 97 %. Physical Exam  Nursing note and vitals reviewed. Constitutional: She is oriented to person, place, and time. She appears well-developed and well-nourished. No distress.  HENT:  Head: Normocephalic and atraumatic.  Right Ear: External ear normal.  Left Ear: External ear normal.  Nose: Nose normal.  Mouth/Throat: Oropharynx is clear and moist.  Eyes: Conjunctivae and EOM are normal. Pupils are equal, round, and reactive to light. No scleral icterus.  Neck: Normal range of motion. Neck supple.  Cardiovascular: Normal rate, regular rhythm, normal heart sounds and intact distal pulses.   No murmur heard. Respiratory: Effort normal and breath sounds normal.  GI: Soft. Bowel sounds are normal.  Musculoskeletal: Normal range of motion. She exhibits no edema.  Neurological: She is alert and oriented to person, place, and time. She has normal reflexes. She displays normal reflexes. No cranial nerve deficit. She exhibits normal  muscle tone. Coordination normal.  Skin: Skin is warm and dry. She is not diaphoretic.  Psychiatric: Her mood appears anxious.   CT of head personally reviewed by me and normal  Assessment/Plan: 1.  Seizure vs. Pseudoseizure-  By hx with pt on 4 meds without control and also with two negative long term monitoring, I would think this is more pseudoseizure. -  Increase Vimpat to $RemoveB'150mg'RTyMhhFA$  BID -  Restart Lamictal at $RemoveBef'50mg'PbcFbCTaDj$  BID x 1 week then $RemoveB'25mg'EVbshJHU$  BID x 1 week then off -  Continue Keppra $RemoveBeforeD'750mg'EhSrXFjICQOyUN$  BID and Neurotin $RemoveBefo'300mg'ShehALpLBWr$  BID -  No driving or operating heavy machinery x 6 months -  Will sign off, please call with questions -  Needs to f/u with Dr. Melrose Nakayama in 4-6 weeks or soon if more problems occur  Cyndi Montejano 11/04/2014, 11:17 AM

## 2014-11-04 NOTE — Consult Note (Signed)
  Consult received. PAtient has already been discharged.

## 2014-11-06 LAB — LEVETIRACETAM LEVEL: LEVETIRACETAM: 26.1 ug/mL (ref 10.0–40.0)

## 2014-11-06 LAB — LAMOTRIGINE LEVEL: LAMOTRIGINE LVL: 2.2 ug/mL (ref 2.0–20.0)

## 2014-11-06 LAB — TOPIRAMATE LEVEL: Topiramate Lvl: 2 ug/mL (ref 2.0–25.0)

## 2014-11-17 DIAGNOSIS — R946 Abnormal results of thyroid function studies: Secondary | ICD-10-CM | POA: Diagnosis not present

## 2014-11-17 DIAGNOSIS — R1012 Left upper quadrant pain: Secondary | ICD-10-CM | POA: Diagnosis not present

## 2014-11-17 DIAGNOSIS — J4540 Moderate persistent asthma, uncomplicated: Secondary | ICD-10-CM | POA: Diagnosis not present

## 2014-11-17 DIAGNOSIS — J3089 Other allergic rhinitis: Secondary | ICD-10-CM | POA: Diagnosis not present

## 2014-11-18 ENCOUNTER — Other Ambulatory Visit: Payer: Self-pay | Admitting: Internal Medicine

## 2014-11-18 DIAGNOSIS — R946 Abnormal results of thyroid function studies: Secondary | ICD-10-CM | POA: Diagnosis not present

## 2014-11-18 DIAGNOSIS — R1012 Left upper quadrant pain: Secondary | ICD-10-CM

## 2014-11-24 ENCOUNTER — Ambulatory Visit: Admission: RE | Admit: 2014-11-24 | Payer: Commercial Managed Care - HMO | Source: Ambulatory Visit

## 2014-11-24 ENCOUNTER — Ambulatory Visit
Admission: RE | Admit: 2014-11-24 | Discharge: 2014-11-24 | Disposition: A | Discharge status: Home / Self Care | Payer: Commercial Managed Care - HMO | Source: Ambulatory Visit | Attending: Internal Medicine | Admitting: Internal Medicine

## 2014-11-24 DIAGNOSIS — R1012 Left upper quadrant pain: Secondary | ICD-10-CM | POA: Diagnosis not present

## 2014-11-24 DIAGNOSIS — N200 Calculus of kidney: Secondary | ICD-10-CM | POA: Diagnosis not present

## 2014-11-24 DIAGNOSIS — R1010 Upper abdominal pain, unspecified: Secondary | ICD-10-CM | POA: Diagnosis not present

## 2014-12-01 DIAGNOSIS — Z91030 Bee allergy status: Secondary | ICD-10-CM | POA: Insufficient documentation

## 2014-12-01 DIAGNOSIS — Z87898 Personal history of other specified conditions: Secondary | ICD-10-CM | POA: Diagnosis not present

## 2014-12-01 DIAGNOSIS — R110 Nausea: Secondary | ICD-10-CM | POA: Insufficient documentation

## 2014-12-01 DIAGNOSIS — K219 Gastro-esophageal reflux disease without esophagitis: Secondary | ICD-10-CM | POA: Diagnosis not present

## 2014-12-01 DIAGNOSIS — Z91038 Other insect allergy status: Secondary | ICD-10-CM | POA: Diagnosis not present

## 2014-12-01 DIAGNOSIS — M79675 Pain in left toe(s): Secondary | ICD-10-CM | POA: Diagnosis not present

## 2014-12-17 DIAGNOSIS — L03032 Cellulitis of left toe: Secondary | ICD-10-CM | POA: Diagnosis not present

## 2014-12-17 DIAGNOSIS — R6889 Other general symptoms and signs: Secondary | ICD-10-CM | POA: Diagnosis not present

## 2014-12-17 DIAGNOSIS — L03031 Cellulitis of right toe: Secondary | ICD-10-CM | POA: Diagnosis not present

## 2015-01-04 DIAGNOSIS — F329 Major depressive disorder, single episode, unspecified: Secondary | ICD-10-CM | POA: Diagnosis not present

## 2015-01-04 DIAGNOSIS — F445 Conversion disorder with seizures or convulsions: Secondary | ICD-10-CM | POA: Diagnosis not present

## 2015-01-04 DIAGNOSIS — R51 Headache: Secondary | ICD-10-CM | POA: Diagnosis not present

## 2015-01-14 DIAGNOSIS — M25512 Pain in left shoulder: Secondary | ICD-10-CM | POA: Diagnosis not present

## 2015-01-14 DIAGNOSIS — M5030 Other cervical disc degeneration, unspecified cervical region: Secondary | ICD-10-CM | POA: Diagnosis not present

## 2015-03-10 DIAGNOSIS — R6889 Other general symptoms and signs: Secondary | ICD-10-CM | POA: Diagnosis not present

## 2015-03-10 DIAGNOSIS — R079 Chest pain, unspecified: Secondary | ICD-10-CM | POA: Diagnosis not present

## 2015-03-10 DIAGNOSIS — R0602 Shortness of breath: Secondary | ICD-10-CM | POA: Diagnosis not present

## 2015-05-10 ENCOUNTER — Emergency Department: Payer: Commercial Managed Care - HMO

## 2015-05-10 ENCOUNTER — Inpatient Hospital Stay
Admission: EM | Admit: 2015-05-10 | Discharge: 2015-05-12 | DRG: 101 | Disposition: A | Discharge status: Home / Self Care | Payer: Commercial Managed Care - HMO | Attending: Internal Medicine | Admitting: Internal Medicine

## 2015-05-10 ENCOUNTER — Encounter: Payer: Self-pay | Admitting: *Deleted

## 2015-05-10 DIAGNOSIS — Z8673 Personal history of transient ischemic attack (TIA), and cerebral infarction without residual deficits: Secondary | ICD-10-CM

## 2015-05-10 DIAGNOSIS — Z91041 Radiographic dye allergy status: Secondary | ICD-10-CM

## 2015-05-10 DIAGNOSIS — J45909 Unspecified asthma, uncomplicated: Secondary | ICD-10-CM | POA: Diagnosis present

## 2015-05-10 DIAGNOSIS — Z90711 Acquired absence of uterus with remaining cervical stump: Secondary | ICD-10-CM | POA: Diagnosis not present

## 2015-05-10 DIAGNOSIS — Z720 Tobacco use: Secondary | ICD-10-CM | POA: Diagnosis not present

## 2015-05-10 DIAGNOSIS — R51 Headache: Secondary | ICD-10-CM

## 2015-05-10 DIAGNOSIS — G43909 Migraine, unspecified, not intractable, without status migrainosus: Secondary | ICD-10-CM | POA: Diagnosis not present

## 2015-05-10 DIAGNOSIS — Z7982 Long term (current) use of aspirin: Secondary | ICD-10-CM

## 2015-05-10 DIAGNOSIS — R109 Unspecified abdominal pain: Secondary | ICD-10-CM

## 2015-05-10 DIAGNOSIS — N200 Calculus of kidney: Secondary | ICD-10-CM | POA: Diagnosis not present

## 2015-05-10 DIAGNOSIS — F445 Conversion disorder with seizures or convulsions: Secondary | ICD-10-CM

## 2015-05-10 DIAGNOSIS — Z716 Tobacco abuse counseling: Secondary | ICD-10-CM | POA: Diagnosis not present

## 2015-05-10 DIAGNOSIS — F329 Major depressive disorder, single episode, unspecified: Secondary | ICD-10-CM | POA: Diagnosis not present

## 2015-05-10 DIAGNOSIS — Z79899 Other long term (current) drug therapy: Secondary | ICD-10-CM | POA: Diagnosis not present

## 2015-05-10 DIAGNOSIS — M1990 Unspecified osteoarthritis, unspecified site: Secondary | ICD-10-CM | POA: Diagnosis present

## 2015-05-10 DIAGNOSIS — R569 Unspecified convulsions: Secondary | ICD-10-CM | POA: Diagnosis not present

## 2015-05-10 DIAGNOSIS — Z888 Allergy status to other drugs, medicaments and biological substances status: Secondary | ICD-10-CM

## 2015-05-10 DIAGNOSIS — H539 Unspecified visual disturbance: Secondary | ICD-10-CM | POA: Diagnosis present

## 2015-05-10 DIAGNOSIS — Z801 Family history of malignant neoplasm of trachea, bronchus and lung: Secondary | ICD-10-CM | POA: Diagnosis not present

## 2015-05-10 DIAGNOSIS — G40909 Epilepsy, unspecified, not intractable, without status epilepticus: Secondary | ICD-10-CM | POA: Diagnosis not present

## 2015-05-10 DIAGNOSIS — J449 Chronic obstructive pulmonary disease, unspecified: Secondary | ICD-10-CM | POA: Diagnosis present

## 2015-05-10 DIAGNOSIS — S0990XA Unspecified injury of head, initial encounter: Secondary | ICD-10-CM | POA: Diagnosis not present

## 2015-05-10 DIAGNOSIS — R519 Headache, unspecified: Secondary | ICD-10-CM

## 2015-05-10 DIAGNOSIS — F17290 Nicotine dependence, other tobacco product, uncomplicated: Secondary | ICD-10-CM | POA: Diagnosis present

## 2015-05-10 DIAGNOSIS — G4730 Sleep apnea, unspecified: Secondary | ICD-10-CM | POA: Diagnosis present

## 2015-05-10 DIAGNOSIS — Z9889 Other specified postprocedural states: Secondary | ICD-10-CM | POA: Diagnosis not present

## 2015-05-10 DIAGNOSIS — Z880 Allergy status to penicillin: Secondary | ICD-10-CM | POA: Diagnosis not present

## 2015-05-10 DIAGNOSIS — F17200 Nicotine dependence, unspecified, uncomplicated: Secondary | ICD-10-CM | POA: Diagnosis not present

## 2015-05-10 LAB — COMPREHENSIVE METABOLIC PANEL
ALBUMIN: 4.1 g/dL (ref 3.5–5.0)
ALT: 12 U/L — ABNORMAL LOW (ref 14–54)
ANION GAP: 8 (ref 5–15)
AST: 19 U/L (ref 15–41)
Alkaline Phosphatase: 88 U/L (ref 38–126)
BILIRUBIN TOTAL: 0.6 mg/dL (ref 0.3–1.2)
BUN: 9 mg/dL (ref 6–20)
CHLORIDE: 111 mmol/L (ref 101–111)
CO2: 23 mmol/L (ref 22–32)
Calcium: 9.1 mg/dL (ref 8.9–10.3)
Creatinine, Ser: 0.86 mg/dL (ref 0.44–1.00)
GFR calc Af Amer: >60 mL/min (ref >60)
GFR calc non Af Amer: >60 mL/min (ref >60)
GLUCOSE: 76 mg/dL (ref 65–99)
POTASSIUM: 3.7 mmol/L (ref 3.5–5.1)
SODIUM: 142 mmol/L (ref 135–145)
Total Protein: 7 g/dL (ref 6.5–8.1)

## 2015-05-10 LAB — CBC
HEMATOCRIT: 38.4 % (ref 35.0–47.0)
HEMOGLOBIN: 13.2 g/dL (ref 12.0–16.0)
MCH: 29.7 pg (ref 26.0–34.0)
MCHC: 34.3 g/dL (ref 32.0–36.0)
MCV: 86.8 fL (ref 80.0–100.0)
Platelets: 257 10*3/uL (ref 150–440)
RBC: 4.43 MIL/uL (ref 3.80–5.20)
RDW: 11.9 % (ref 11.5–14.5)
WBC: 5.9 10*3/uL (ref 3.6–11.0)

## 2015-05-10 LAB — LIPASE, BLOOD: LIPASE: 41 U/L (ref 11–51)

## 2015-05-10 LAB — VALPROIC ACID LEVEL: VALPROIC ACID LVL: 77 ug/mL (ref 50.0–100.0)

## 2015-05-10 MED ORDER — ONDANSETRON HCL 4 MG/2ML IJ SOLN
4.0000 mg | Freq: Once | INTRAMUSCULAR | Status: AC
  Administered 2015-05-10: 4 mg via INTRAVENOUS

## 2015-05-10 MED ORDER — ONDANSETRON HCL 4 MG/2ML IJ SOLN
INTRAMUSCULAR | Status: AC
  Administered 2015-05-10: 4 mg via INTRAVENOUS
  Filled 2015-05-10: qty 2

## 2015-05-10 MED ORDER — MORPHINE SULFATE (PF) 2 MG/ML IV SOLN
2.0000 mg | INTRAVENOUS | Status: DC | PRN
  Administered 2015-05-10 – 2015-05-11 (×3): 2 mg via INTRAVENOUS
  Filled 2015-05-10 (×3): qty 1

## 2015-05-10 MED ORDER — HEPARIN SODIUM (PORCINE) 5000 UNIT/ML IJ SOLN
5000.0000 [IU] | Freq: Three times a day (TID) | INTRAMUSCULAR | Status: DC
  Administered 2015-05-10 – 2015-05-11 (×2): 5000 [IU] via SUBCUTANEOUS
  Filled 2015-05-10 (×2): qty 1

## 2015-05-10 MED ORDER — PROMETHAZINE HCL 25 MG PO TABS
25.0000 mg | ORAL_TABLET | Freq: Four times a day (QID) | ORAL | Status: DC | PRN
  Administered 2015-05-11: 25 mg via ORAL
  Filled 2015-05-10: qty 1

## 2015-05-10 MED ORDER — TOPIRAMATE 25 MG PO TABS
50.0000 mg | ORAL_TABLET | Freq: Every day | ORAL | Status: DC
  Administered 2015-05-11 – 2015-05-12 (×2): 50 mg via ORAL
  Filled 2015-05-10 (×2): qty 2

## 2015-05-10 MED ORDER — FLUOXETINE HCL 20 MG PO CAPS
20.0000 mg | ORAL_CAPSULE | Freq: Every day | ORAL | Status: DC
  Administered 2015-05-11 – 2015-05-12 (×2): 20 mg via ORAL
  Filled 2015-05-10 (×2): qty 1

## 2015-05-10 MED ORDER — LORAZEPAM 2 MG/ML IJ SOLN
1.0000 mg | Freq: Once | INTRAMUSCULAR | Status: AC
  Administered 2015-05-10: 1 mg via INTRAVENOUS
  Filled 2015-05-10: qty 1

## 2015-05-10 MED ORDER — SODIUM CHLORIDE 0.9 % IV SOLN
200.0000 mg | Freq: Two times a day (BID) | INTRAVENOUS | Status: DC
  Administered 2015-05-10: 200 mg via INTRAVENOUS
  Filled 2015-05-10 (×3): qty 20

## 2015-05-10 MED ORDER — ALBUTEROL SULFATE HFA 108 (90 BASE) MCG/ACT IN AERS: 1.0000 | INHALATION_SPRAY | Freq: Four times a day (QID) | RESPIRATORY_TRACT | Status: DC | PRN

## 2015-05-10 MED ORDER — ALBUTEROL SULFATE (2.5 MG/3ML) 0.083% IN NEBU: 2.5000 mg | INHALATION_SOLUTION | Freq: Four times a day (QID) | RESPIRATORY_TRACT | Status: DC | PRN

## 2015-05-10 MED ORDER — PANTOPRAZOLE SODIUM 40 MG PO TBEC
40.0000 mg | DELAYED_RELEASE_TABLET | Freq: Every day | ORAL | Status: DC
  Administered 2015-05-11 – 2015-05-12 (×2): 40 mg via ORAL
  Filled 2015-05-10 (×2): qty 1

## 2015-05-10 MED ORDER — ASPIRIN EC 81 MG PO TBEC
81.0000 mg | DELAYED_RELEASE_TABLET | Freq: Every day | ORAL | Status: DC
  Administered 2015-05-11 – 2015-05-12 (×2): 81 mg via ORAL
  Filled 2015-05-10 (×2): qty 1

## 2015-05-10 MED ORDER — DIVALPROEX SODIUM 500 MG PO DR TAB
500.0000 mg | DELAYED_RELEASE_TABLET | Freq: Two times a day (BID) | ORAL | Status: DC
  Administered 2015-05-10 – 2015-05-12 (×4): 500 mg via ORAL
  Filled 2015-05-10 (×4): qty 1

## 2015-05-10 MED ORDER — ACETAMINOPHEN 325 MG PO TABS: 325.0000 mg | ORAL_TABLET | Freq: Four times a day (QID) | ORAL | Status: DC | PRN

## 2015-05-10 MED ORDER — GABAPENTIN 300 MG PO CAPS
300.0000 mg | ORAL_CAPSULE | Freq: Two times a day (BID) | ORAL | Status: DC
  Administered 2015-05-10 – 2015-05-12 (×4): 300 mg via ORAL
  Filled 2015-05-10 (×4): qty 1

## 2015-05-10 MED ORDER — BUTALBITAL-APAP-CAFFEINE 50-325-40 MG PO TABS
1.0000 | ORAL_TABLET | Freq: Four times a day (QID) | ORAL | Status: DC | PRN
  Administered 2015-05-11: 1 via ORAL
  Filled 2015-05-10: qty 1

## 2015-05-10 MED ORDER — BARIUM SULFATE 2.1 % PO SUSP
900.0000 mL | ORAL | Status: AC
  Administered 2015-05-10 (×2): 900 mL via ORAL

## 2015-05-10 MED ORDER — ONDANSETRON HCL 4 MG/2ML IJ SOLN
4.0000 mg | Freq: Once | INTRAMUSCULAR | Status: DC
  Filled 2015-05-10: qty 2

## 2015-05-10 NOTE — ED Notes (Signed)
Patient states left lower flank pain still there. And states "she might be about to have another seizure". Patient given morphine." Immediate improvement" noted after medication. Patient able to speak in complete sentences and asked for dinner. Will continue to monitor.

## 2015-05-10 NOTE — H&P (Addendum)
[facility] NAME: [name]    MR#:  [id number]  DATE OF BIRTH:  September 16, 1970  DATE OF ADMISSION:  05/10/2015  PRIMARY CARE PHYSICIAN: Glendon Axe, MD   REQUESTING/REFERRING PHYSICIAN: Schaevitz  CHIEF COMPLAINT:   Chief Complaint  Patient presents with  . Head Injury    HISTORY OF PRESENT ILLNESS: [name]  is a 45 y.o. female with a known history of anxiety, arthritis, asthma, migraine, seizure, stroke, sleep apnea- brought today by her husband because of having seizures. After the seizure episodes patient complain also of decreased vision on her right eye and headache on the right side of her head. She also complained of abdominal pain on her left side, but CT scan in the ER was found negative for any abdominal pathologies. ER physician spoke to her neurologist as he suggested to give her IV Vimpat for now.  PAST MEDICAL HISTORY:   Past Medical History  Diagnosis Date  . Anxiety   . Arthritis   . Asthma   . Migraines   . Depression   . Emphysema/COPD ([place])   . Sleep apnea   . Stroke ([place])   . Seizure ([place])     PAST SURGICAL HISTORY: Past Surgical History  Procedure Laterality Date  . Cesarean section  1993  . Partial hysterectomy  1995  . Abdominal hysterectomy      partial    SOCIAL HISTORY:  Social History  Substance Use Topics  . Smoking status: Current Every Day Smoker    Types: E-cigarettes  . Smokeless tobacco: Never Used  . Alcohol Use: No    FAMILY HISTORY:  Family History  Problem Relation Age of Onset  . Diabetes Neg Hx   . Lung cancer Mother     DRUG ALLERGIES:  Allergies  Allergen Reactions  . Bee Venom Anaphylaxis  . Shellfish Allergy Anaphylaxis  . Contrast Media [Iodinated Diagnostic Agents] Other (See Comments)    Reaction:  Unknown   . Penicillins Other (See Comments)    Reaction:  Seizures  Has patient had a PCN reaction causing immediate rash,  facial/tongue/throat swelling, SOB or lightheadedness with hypotension: No Has patient had a PCN reaction causing severe rash involving mucus membranes or skin necrosis: No Has patient had a PCN reaction that required hospitalization Yes Has patient had a PCN reaction occurring within the last 10 years: Yes If all of the above answers are "NO", then may proceed with Cephalosporin use.  . Sulfa Antibiotics Itching    REVIEW OF SYSTEMS:   CONSTITUTIONAL: No fever, fatigue or weakness.  EYES: No blurred or double vision.  EARS, NOSE, AND THROAT: No tinnitus or ear pain.  RESPIRATORY: No cough, shortness of breath, wheezing or hemoptysis.  CARDIOVASCULAR: No chest pain, orthopnea, edema.  GASTROINTESTINAL: No nausea, vomiting, diarrhea , she have left side abdominal pain.  GENITOURINARY: No dysuria, hematuria.  ENDOCRINE: No polyuria, nocturia,  HEMATOLOGY: No anemia, easy bruising or bleeding SKIN: No rash or lesion. MUSCULOSKELETAL: No joint pain or arthritis.   NEUROLOGIC: No tingling, numbness, weakness.  PSYCHIATRY: No anxiety or depression.   MEDICATIONS AT HOME:  Prior to Admission medications   Medication Sig Start Date End Date Taking? Authorizing Provider  acetaminophen (TYLENOL) 325 MG tablet Take 325-650 mg by mouth every 6 (six) hours as needed for mild pain or headache.   Yes Historical Provider, MD  albuterol (PROVENTIL HFA;VENTOLIN HFA) 108 (90 BASE) MCG/ACT inhaler Inhale 1-2 puffs into the  lungs every 6 (six) hours as needed for wheezing or shortness of breath.   Yes Historical Provider, MD  albuterol (PROVENTIL) (2.5 MG/3ML) 0.083% nebulizer solution Take 2.5 mg by nebulization every 6 (six) hours as needed for wheezing or shortness of breath.   Yes Historical Provider, MD  aspirin EC 81 MG tablet Take 81 mg by mouth daily.   Yes Historical Provider, MD  butalbital-aspirin-caffeine [facility]) 50-325-40 MG capsule Take 1 capsule by mouth daily as needed for headache.    Yes Historical Provider, MD  divalproex (DEPAKOTE) 250 MG DR tablet Take 500 mg by mouth 2 (two) times daily.   Yes Historical Provider, MD  EPINEPHrine (EPIPEN 2-PAK) 0.3 mg/0.3 mL IJ SOAJ injection Inject 0.3 mg into the muscle once as needed (for severe allergic reaction).   Yes Historical Provider, MD  FLUoxetine (PROZAC) 20 MG capsule Take 20 mg by mouth daily.   Yes Historical Provider, MD  gabapentin (NEURONTIN) 300 MG capsule Take 300 mg by mouth 2 (two) times daily.   Yes Historical Provider, MD  Lacosamide (VIMPAT) 100 MG TABS Take 1.5 tablets (150 mg total) by mouth 2 (two) times daily. 11/04/14  Yes Bettey Costa, MD  omeprazole (PRILOSEC) 20 MG capsule Take 20 mg by mouth daily.   Yes Historical Provider, MD  pantoprazole (PROTONIX) 40 MG tablet Take 40 mg by mouth daily.   Yes Historical Provider, MD  promethazine (PHENERGAN) 25 MG tablet Take 25 mg by mouth every 6 (six) hours as needed for nausea or vomiting.   Yes Historical Provider, MD  topiramate (TOPAMAX) 50 MG tablet Take 50 mg by mouth daily.   Yes Historical Provider, MD      PHYSICAL EXAMINATION:   VITAL SIGNS: Blood pressure 109/73, pulse 74, temperature 98 F (36.7 C), temperature source Oral, resp. rate 19, height 5\' 3"  (1.6 m), weight 72.576 kg (160 lb), SpO2 94 %.  GENERAL:  45 y.o.-year-old patient lying in the bed with no acute distress.  EYES: Pupils equal, round, reactive to light and accommodation. No scleral icterus. Extraocular muscles intact.  HEENT: Head atraumatic, normocephalic. Oropharynx and nasopharynx clear. Slight facial deviation. NECK:  Supple, no jugular venous distention. No thyroid enlargement, no tenderness.  LUNGS: Normal breath sounds bilaterally, no wheezing, rales,rhonchi or crepitation. No use of accessory muscles of respiration.  CARDIOVASCULAR: S1, S2 normal. No murmurs, rubs, or gallops.  ABDOMEN: Soft, mild left-sided upper quadrant tender , nondistended. Bowel sounds present. No  organomegaly or mass.  EXTREMITIES: No pedal edema, cyanosis, or clubbing.  NEUROLOGIC: Cranial nerves II through XII are intact. Muscle strength 5/5 in all extremities. Sensation intact. Gait not checked. She speaks very slow speech in beginning, but as she engaged with me in talking her speech was much faster later. PSYCHIATRIC: The patient is alert and oriented x 3.  SKIN: No obvious rash, lesion, or ulcer.   LABORATORY PANEL:   CBC  Recent Labs Lab 05/10/15 1556  WBC 5.9  HGB 13.2  HCT 38.4  PLT 257  MCV 86.8  MCH 29.7  MCHC 34.3  RDW 11.9   ------------------------------------------------------------------------------------------------------------------  Chemistries   Recent Labs Lab 05/10/15 1556  NA 142  K 3.7  CL 111  CO2 23  GLUCOSE 76  BUN 9  CREATININE 0.86  CALCIUM 9.1  AST 19  ALT 12*  ALKPHOS 88  BILITOT 0.6   ------------------------------------------------------------------------------------------------------------------ estimated creatinine clearance is 77.2 mL/min (by C-G formula based on Cr of 0.86). ------------------------------------------------------------------------------------------------------------------ No results for input(s): TSH,  T4TOTAL, T3FREE, THYROIDAB in the last 72 hours.  Invalid input(s): FREET3   Coagulation profile No results for input(s): INR, PROTIME in the last 168 hours. ------------------------------------------------------------------------------------------------------------------- No results for input(s): DDIMER in the last 72 hours. -------------------------------------------------------------------------------------------------------------------  Cardiac Enzymes No results for input(s): CKMB, TROPONINI, MYOGLOBIN in the last 168 hours.  Invalid input(s): CK ------------------------------------------------------------------------------------------------------------------ Invalid input(s):  POCBNP  ---------------------------------------------------------------------------------------------------------------  Urinalysis    Component Value Date/Time   COLORURINE Straw 11/08/2013 2141   APPEARANCEUR Clear 11/08/2013 2141   LABSPEC 1.008 11/08/2013 2141   PHURINE 7.0 11/08/2013 2141   GLUCOSEU Negative 11/08/2013 2141   HGBUR Negative 11/08/2013 2141   BILIRUBINUR Negative 11/08/2013 2141   KETONESUR Negative 11/08/2013 2141   PROTEINUR Negative 11/08/2013 2141   NITRITE Negative 11/08/2013 2141   LEUKOCYTESUR Negative 11/08/2013 2141     RADIOLOGY: Ct Abdomen Pelvis Wo Contrast  05/10/2015  CLINICAL DATA:  Left flank pain EXAM: CT ABDOMEN AND PELVIS WITHOUT CONTRAST TECHNIQUE: Multidetector CT imaging of the abdomen and pelvis was performed following the standard protocol without IV contrast. COMPARISON:  CT 11/24/2014 FINDINGS: Lower chest:  Lung bases clear. Hepatobiliary: Unenhanced images of liver are within normal limits. Normal liver size and contour. No liver lesion. Gallbladder contracted and normal. Bile ducts nondilated Pancreas: Negative Spleen: 2.8 cm calcific cyst in the spleen is unchanged and appears chronic and benign. Spleen is not enlarged. Adrenals/Urinary Tract: 3 mm nonobstructing left upper pole stone is unchanged. No other renal calculi. No mass or obstruction. Urinary bladder distended without focal abnormality. Stomach/Bowel: Negative for bowel obstruction. No bowel edema. Moderate amount of stool throughout the colon. Appendix normal. Negative for diverticulitis or mass. Vascular/Lymphatic: Negative Reproductive: Hysterectomy changes.  No pelvic mass. Other: No free fluid. Musculoskeletal: Negative IMPRESSION: No acute abnormality 3 mm nonobstructing left upper pole renal calculus Chronic calcified cyst in the spleen stable. Electronically Signed   By: Franchot Gallo M.D.   On: 05/10/2015 18:08   Ct Head Wo Contrast  05/10/2015  CLINICAL DATA:  Blow to  the head today during a seizure. Initial encounter. EXAM: CT HEAD WITHOUT CONTRAST TECHNIQUE: Contiguous axial images were obtained from the base of the skull through the vertex without intravenous contrast. COMPARISON:  Head CT scan 11/03/2014. FINDINGS: There is no evidence of acute intracranial abnormality including hemorrhage, infarct, mass lesion, mass effect, midline shift or abnormal extra-axial fluid collection. No hydrocephalus or pneumocephalus. The calvarium is intact. Imaged paranasal sinuses and mastoid air cells are clear. IMPRESSION: Negative head CT. Electronically Signed   By: Inge Rise M.D.   On: 05/10/2015 15:25    EKG: Orders placed or performed during the hospital encounter of 05/10/15  . EKG 12-Lead  . EKG 12-Lead    IMPRESSION AND PLAN: * Repeated seizures   Not sure if this is pseudoseizures as per ER physician.   He spoke to a neurologist in suggested to start on IV Vimpat twice a day.   CT head is negative.  * Migraine headache.  Some visual disturbance on her right eye.   Possibly this is a presentation of migraine or it is postictal or it is false complaint.   CT head is negative currently, neurology consult tomorrow.   I will give Fioricet for now.  * Abdominal pain  CT scan of the abdomen is negative.   No complaint of constipation.   IV morphine for now.  * History of stroke   Continue aspirin.  * Depression continue fluoxetine.   * Smoking  Counseled to quit smoking for 4 minutes.   All the records are reviewed and case discussed with ED provider. Management plans discussed with the patient, family and they are in agreement.  CODE STATUS: full code  Code Status History    Date Active Date Inactive Code Status Order ID Comments User Context   11/03/2014 11:20 PM 11/04/2014  7:18 PM Full Code [id number]  Lytle Butte, MD ED       TOTAL TIME TAKING CARE OF THIS PATIENT:50es.    Vaughan Basta M.D on 05/10/2015   Between 7am to  6pm - Pager - [phone]  After 6pm go to [email or web address] - password [email or web address] [place] [facility]  Office  [phone]  CC: Primary care physician; Glendon Axe, MD   Note: This dictation was prepared with Dragon dictation along with smaller phrase technology. Any transcriptional errors that result from this process are unintentional.

## 2015-05-10 NOTE — ED Notes (Signed)
Spoke with [facility] in pharmacy and they will sent VIMPAT to unit for room [place] RN notified

## 2015-05-10 NOTE — ED Notes (Signed)
Pt started drinking 2nd bottle of contrast

## 2015-05-10 NOTE — ED Notes (Signed)
States hx of epilipsey, states this AM she hit her head during a seizure and now pt states left lower abd pain and abd pain as well as blurry vision, pt awake and alert, pts speech short and occasional slurred

## 2015-05-10 NOTE — ED Notes (Addendum)
Pt c/o head pain and pain from belly button to mid chest - Pt reports history of seizure and having a seizure this morning (lasted 1-2 minutes) - Pt family reports that she fell when going to the restroom this morning when she had the seizure - Family reports slurred speech and blurred vision in right eye - MD in room with patient

## 2015-05-10 NOTE — ED Notes (Signed)
Pt speech has cleared with no slurring or stammering noted at this time - Pt is sitting straight up in bed drinking 1st bottle of contrast

## 2015-05-10 NOTE — ED Provider Notes (Signed)
[facility] Emergency Department Provider Note  ____________________________________________  Time seen: Approximately 330 PM  I have reviewed the triage vital signs and the nursing notes.   HISTORY  Chief Complaint Head Injury    HPI Lunna Brazil is a 45 y.o. female with a history of seizure versus pseudoseizure who is presenting today after a seizure. The patient is accompanied by her husband who says that she was walking to the bathroom when she fell to the floor and had about a 1 minute seizure. He describes the seizure as a generalized shaking with her eyes rolled back and her back arched. The patient did not lose bowel or bladder continence. She has been having left flank as well as periumbilical pain, stuttered speech and blurred vision ever since. She says that the blurred vision has actually been ongoing for the past 2 days after seizure 2 days ago. She says it has been associated with a right-sided headache which is throbbing and has been steadily increasing over the past 2 days. Denies any neck pain, fever. Says she has been compliant with her medications. Denies any other recent illness such as cough or runny nose.   Past Medical History  Diagnosis Date  . Anxiety   . Arthritis   . Asthma   . Migraines   . Depression   . Emphysema/COPD ([place])   . Sleep apnea   . Stroke ([place])   . Seizure [facility])     Patient Active Problem List   Diagnosis Date Noted  . Seizures ([place]) 05/17/2014  . Respiratory failure, acute ([place]) 05/16/2014  . COPD (chronic obstructive pulmonary disease) ([place])   . Emphysema/COPD ([place])   . Sleep apnea   . Stroke ([place])   . Seizure [facility])     Past Surgical History  Procedure Laterality Date  . Cesarean section  1993  . Partial hysterectomy  1995  . Abdominal hysterectomy      partial    Current Outpatient Rx  Name  Route  Sig  Dispense  Refill  . acetaminophen (TYLENOL) 325 MG tablet   Oral   Take 325-650 mg by  mouth every 6 (six) hours as needed for mild pain or headache.         . albuterol (PROVENTIL HFA;VENTOLIN HFA) 108 (90 BASE) MCG/ACT inhaler   Inhalation   Inhale 1-2 puffs into the lungs every 6 (six) hours as needed for wheezing or shortness of breath.         Marland Kitchen albuterol (PROVENTIL) (2.5 MG/3ML) 0.083% nebulizer solution   Nebulization   Take 2.5 mg by nebulization every 6 (six) hours as needed for wheezing or shortness of breath.         Marland Kitchen aspirin EC 81 MG tablet   Oral   Take 81 mg by mouth daily.         Marland Kitchen EPINEPHrine (EPIPEN 2-PAK) 0.3 mg/0.3 mL IJ SOAJ injection   Intramuscular   Inject 0.3 mg into the muscle once as needed (for severe allergic reaction).         Marland Kitchen FLUoxetine (PROZAC) 20 MG capsule   Oral   Take 20 mg by mouth daily.         Marland Kitchen gabapentin (NEURONTIN) 300 MG capsule   Oral   Take 300 mg by mouth 2 (two) times daily.         . Lacosamide (VIMPAT) 100 MG TABS   Oral   Take 1.5 tablets (150 mg total) by mouth 2 (  two) times daily.   60 tablet   0   . lamoTRIgine (LAMICTAL) 25 MG tablet   Oral   Take 2 tablets (50 mg total) by mouth 2 (two) times daily.   28 tablet   0     Should be 50 mg PO BID for 1 week then 25 mg PO BI ...   . levETIRAcetam (KEPPRA) 750 MG tablet   Oral   Take 1 tablet (750 mg total) by mouth 2 (two) times daily.   60 tablet   0   . topiramate (TOPAMAX) 50 MG tablet   Oral   Take 50 mg by mouth daily.           Allergies Bee venom; Shellfish allergy; Contrast media; Penicillins; and Sulfa antibiotics  Family History  Problem Relation Age of Onset  . Diabetes Neg Hx     Social History Social History  Substance Use Topics  . Smoking status: Current Every Day Smoker    Types: E-cigarettes  . Smokeless tobacco: Never Used  . Alcohol Use: No    Review of Systems Constitutional: No fever/chills Eyes: Blurred vision from the right eye. Normal vision to left eye. ENT: No sore  throat. Cardiovascular: Denies chest pain. Respiratory: Denies shortness of breath. Gastrointestinal:  No nausea, no vomiting.  No diarrhea.  No constipation. Genitourinary: Negative for dysuria. Musculoskeletal: Negative for back pain. Skin: Negative for rash. Neurological: Negative for focal weakness or numbness.  10-point ROS otherwise negative.  ____________________________________________   PHYSICAL EXAM:  VITAL SIGNS: ED Triage Vitals  Enc Vitals Group     BP 05/10/15 1508 131/76 mmHg     Pulse Rate 05/10/15 1508 88     Resp 05/10/15 1508 20     Temp 05/10/15 1508 98 F (36.7 C)     Temp Source 05/10/15 1508 Oral     SpO2 05/10/15 1508 100 %     Weight 05/10/15 1508 160 lb (72.576 kg)     Height 05/10/15 1508 5\' 3"  (1.6 m)     Head Cir --      Peak Flow --      Pain Score 05/10/15 1512 8     Pain Loc --      Pain Edu? --      Excl. in [place]? --     Constitutional: Alert and oriented. Well appearing and in no acute distress. Eyes: Conjunctivae are normal. PERRL. EOMI.  Visual acuity unmeasurable to the right. The patient says that she is able to see light and dark but no discernible shapes. Left eye is 20/100 which the patient says is baseline. Says that she normally wears glasses. Head: Atraumatic. Nose: No congestion/rhinnorhea. Mouth/Throat: Mucous membranes are moist.  Oropharynx non-erythematous. Neck: No stridor.  No tenderness to palpation. Ranges freely without any restriction. Cardiovascular: Normal rate, regular rhythm. Grossly normal heart sounds.  Good peripheral circulation. Respiratory: Normal respiratory effort.  No retractions. Lungs CTAB. Gastrointestinal: Soft with very mild left lower quadrant tenderness which is distractible. No distention. No abdominal bruits.  Left-sided CVA tenderness palpation. Musculoskeletal: No lower extremity tenderness nor edema.  No joint effusions. Neurologic:  Stuttering speech which is interspersed by full sentences.   No other discernible neurological deficits. No facial droop. Full strength to all 4 extremities without any sensory deficits.  Skin:  Skin is warm, dry and intact. No rash noted. Psychiatric: Intermittently tearful. ____________________________________________   LABS (all labs ordered are listed, but only abnormal results are displayed)  Labs  Reviewed  COMPREHENSIVE METABOLIC PANEL - Abnormal; Notable for the following:    ALT 12 (*)    All other components within normal limits  LIPASE, BLOOD  CBC  VALPROIC ACID LEVEL  URINALYSIS COMPLETEWITH MICROSCOPIC ([facility] ONLY)   ____________________________________________  EKG  ED ECG REPORT I, Schaevitz,  Youlanda Roys, the attending physician, personally viewed and interpreted this ECG.   Date: 05/10/2015  EKG Time: 1514  Rate: 76  Rhythm: normal sinus rhythm  Axis: Normal  Intervals:none  ST&T Change: No ST segment elevation or depression. No abnormal T-wave inversion.  ____________________________________________  RADIOLOGY  Normal head CT. No acute finding on the CAT scan of the abdomen and pelvis. ____________________________________________   PROCEDURES   ____________________________________________   INITIAL IMPRESSION / ASSESSMENT AND PLAN / ED COURSE  Pertinent labs & imaging results that were available during my care of the patient were reviewed by me and considered in my medical decision making (see chart for details).  Patient was up until recently seen Dr. Melrose Nakayama here in [place] but has since been referred to the [facility] for further workup. The husband does not think that the seizures are stress related.  ----------------------------------------- 5:16 PM on 05/10/2015 -----------------------------------------  Discussed the case with Dr. Doy Mince of neurology. She says that as long as the CAT scan is normal after 2 days of symptoms of the patient should be able to follow-up with  either Dr. Melrose Nakayama over the phone or [facility]. She recommends getting an accident dose of Vimpat, 200 mg, the IV before leaving the emergency department and that the patient not take her by mouth dose.  ----------------------------------------- 6:24 PM on 05/10/2015 -----------------------------------------  Patient, since the last update, has had 2 seizures. Lasting maximum 1 minute. One was witnessed by the CAT scan tech was roller back from her abdominal CAT scan and said that she began to have generalized shaking. I came to the room after the incident and the patient was slowly regaining consciousness. She quickly returned her baseline and at that point I inform her that we would need to admit her to the hospital for persistent seizure. She'll be receiving her IV Vimpat. I signed the case out to Dr. Anselm Jungling of the internal medicine service. ____________________________________________   FINAL CLINICAL IMPRESSION(S) / ED DIAGNOSES  Seizure versus pseudoseizure. Recurrent.    Orbie Pyo, MD 05/10/15 [phone]

## 2015-05-10 NOTE — ED Notes (Signed)
Pt notified that urine sample was needed

## 2015-05-10 NOTE — ED Notes (Signed)
Per CT tech pt had "? Seizure" on the way back from the CT dept - MD notified - Pt not having seizure activity at this time but does have slurred/slowed speech noted again

## 2015-05-10 NOTE — ED Notes (Signed)
Patient has begun drinking first contrast. Second to be at 1700.

## 2015-05-11 DIAGNOSIS — R569 Unspecified convulsions: Secondary | ICD-10-CM

## 2015-05-11 DIAGNOSIS — G43909 Migraine, unspecified, not intractable, without status migrainosus: Secondary | ICD-10-CM

## 2015-05-11 DIAGNOSIS — F445 Conversion disorder with seizures or convulsions: Secondary | ICD-10-CM

## 2015-05-11 LAB — CBC
HEMATOCRIT: 35.7 % (ref 35.0–47.0)
HEMOGLOBIN: 12.4 g/dL (ref 12.0–16.0)
MCH: 30.1 pg (ref 26.0–34.0)
MCHC: 34.6 g/dL (ref 32.0–36.0)
MCV: 86.9 fL (ref 80.0–100.0)
Platelets: 232 10*3/uL (ref 150–440)
RBC: 4.11 MIL/uL (ref 3.80–5.20)
RDW: 12 % (ref 11.5–14.5)
WBC: 6.1 10*3/uL (ref 3.6–11.0)

## 2015-05-11 LAB — URINALYSIS COMPLETE WITH MICROSCOPIC (ARMC ONLY)
BACTERIA UA: NONE SEEN
BILIRUBIN URINE: NEGATIVE
GLUCOSE, UA: NEGATIVE mg/dL
HGB URINE DIPSTICK: NEGATIVE
Ketones, ur: NEGATIVE mg/dL
Leukocytes, UA: NEGATIVE
NITRITE: NEGATIVE
Protein, ur: NEGATIVE mg/dL
RBC / HPF: NONE SEEN RBC/hpf (ref 0–5)
Specific Gravity, Urine: 1.012 (ref 1.005–1.030)
pH: 6 (ref 5.0–8.0)

## 2015-05-11 LAB — BASIC METABOLIC PANEL
ANION GAP: 4 — AB (ref 5–15)
BUN: 10 mg/dL (ref 6–20)
CALCIUM: 8.7 mg/dL — AB (ref 8.9–10.3)
CHLORIDE: 110 mmol/L (ref 101–111)
CO2: 28 mmol/L (ref 22–32)
Creatinine, Ser: 0.89 mg/dL (ref 0.44–1.00)
GFR calc Af Amer: >60 mL/min (ref >60)
GFR calc non Af Amer: >60 mL/min (ref >60)
GLUCOSE: 94 mg/dL (ref 65–99)
Potassium: 3.9 mmol/L (ref 3.5–5.1)
Sodium: 142 mmol/L (ref 135–145)

## 2015-05-11 MED ORDER — HYDROCODONE-ACETAMINOPHEN 5-325 MG PO TABS: 1.0000 | ORAL_TABLET | Freq: Four times a day (QID) | ORAL | Status: DC | PRN

## 2015-05-11 MED ORDER — ENOXAPARIN SODIUM 40 MG/0.4ML ~~LOC~~ SOLN
40.0000 mg | SUBCUTANEOUS | Status: DC
  Administered 2015-05-11: 21:00:00 40 mg via SUBCUTANEOUS
  Filled 2015-05-11: qty 0.4

## 2015-05-11 MED ORDER — LACOSAMIDE 50 MG PO TABS
150.0000 mg | ORAL_TABLET | Freq: Two times a day (BID) | ORAL | Status: DC
  Administered 2015-05-11: 150 mg via ORAL
  Filled 2015-05-11: qty 3

## 2015-05-11 MED ORDER — LACOSAMIDE 50 MG PO TABS
200.0000 mg | ORAL_TABLET | Freq: Two times a day (BID) | ORAL | Status: DC
  Administered 2015-05-11 – 2015-05-12 (×2): 200 mg via ORAL
  Filled 2015-05-11 (×2): qty 4

## 2015-05-11 MED ORDER — BUTALBITAL-APAP-CAFFEINE 50-325-40 MG PO TABS: 1.0000 | ORAL_TABLET | Freq: Three times a day (TID) | ORAL | Status: DC | PRN

## 2015-05-11 MED ORDER — LACOSAMIDE 200 MG PO TABS: 200.0000 mg | ORAL_TABLET | Freq: Two times a day (BID) | ORAL | Status: DC

## 2015-05-11 MED ORDER — MORPHINE SULFATE (PF) 2 MG/ML IV SOLN: 1.0000 mg | Freq: Four times a day (QID) | INTRAVENOUS | Status: DC | PRN

## 2015-05-11 MED ORDER — LORAZEPAM 1 MG PO TABS
1.0000 mg | ORAL_TABLET | Freq: Once | ORAL | Status: AC
  Administered 2015-05-11: 1 mg via ORAL
  Filled 2015-05-11: qty 1

## 2015-05-11 NOTE — Consult Note (Signed)
Reason for Consult:Seizure Referring Physician: Posey Pronto  CC: Seizure  HPI: [name] is an 45 y.o. female with a history of seizures and possible pseudoseizures who presented to the ED after having multiple seizures at home.  The patient was accompanied by her husband who says that she was walking to the bathroom when she fell to the floor and had about a 1 minute seizure. He describes the seizure as a generalized shaking with her eyes rolled back and her back arched. The patient did not lose bowel or bladder continence. She has been having left flank pain as well as periumbilical pain, stuttered speech and blurred vision ever since. She says that the blurred vision has actually been ongoing for the past week.  She also describes a right sided headache.   Her last seizure 2 days prior to presentation.  While in the ED the patient also had further questionable episodes and was admitted for further evaluation.    Past Medical History  Diagnosis Date  . Anxiety   . Arthritis   . Asthma   . Migraines   . Depression   . Emphysema/COPD ([place])   . Sleep apnea   . Stroke ([place])   . Seizure [facility])     Past Surgical History  Procedure Laterality Date  . Cesarean section  1993  . Partial hysterectomy  1995  . Abdominal hysterectomy      partial  . Tonsillectomy      Family History  Problem Relation Age of Onset  . Diabetes Neg Hx   . Lung cancer Mother     Social History:  reports that she has been smoking E-cigarettes.  She has never used smokeless tobacco. She reports that she does not drink alcohol or use illicit drugs.  Allergies  Allergen Reactions  . Bee Venom Anaphylaxis  . Shellfish Allergy Anaphylaxis  . Contrast Media [Iodinated Diagnostic Agents] Other (See Comments)    Reaction:  Unknown   . Penicillins Other (See Comments)    Reaction:  Seizures  Has patient had a PCN reaction causing immediate rash, facial/tongue/throat swelling, SOB or lightheadedness with  hypotension: No Has patient had a PCN reaction causing severe rash involving mucus membranes or skin necrosis: No Has patient had a PCN reaction that required hospitalization Yes Has patient had a PCN reaction occurring within the last 10 years: Yes If all of the above answers are "NO", then may proceed with Cephalosporin use.  . Sulfa Antibiotics Itching    Medications:  I have reviewed the patient's current medications. Prior to Admission:  Prescriptions prior to admission  Medication Sig Dispense Refill Last Dose  . acetaminophen (TYLENOL) 325 MG tablet Take 325-650 mg by mouth every 6 (six) hours as needed for mild pain or headache.   05/10/2015 at 1100  . albuterol (PROVENTIL HFA;VENTOLIN HFA) 108 (90 BASE) MCG/ACT inhaler Inhale 1-2 puffs into the lungs every 6 (six) hours as needed for wheezing or shortness of breath.   05/09/2015 at Unknown time  . aspirin EC 81 MG tablet Take 81 mg by mouth daily.   Past Week at Unknown time  . butalbital-aspirin-caffeine [facility]) 50-325-40 MG capsule Take 1 capsule by mouth daily as needed for headache.   05/09/2015 at 1700  . divalproex (DEPAKOTE) 250 MG DR tablet Take 500 mg by mouth 2 (two) times daily.   05/10/2015 at Unknown time  . EPINEPHrine (EPIPEN 2-PAK) 0.3 mg/0.3 mL IJ SOAJ injection Inject 0.3 mg into the muscle once as needed (for  severe allergic reaction).   Past Week at Unknown time  . FLUoxetine (PROZAC) 20 MG capsule Take 20 mg by mouth daily.   05/10/2015 at Unknown time  . gabapentin (NEURONTIN) 300 MG capsule Take 300 mg by mouth 2 (two) times daily.   05/10/2015 at Unknown time  . Lacosamide (VIMPAT) 100 MG TABS Take 1.5 tablets (150 mg total) by mouth 2 (two) times daily. 60 tablet 0 05/10/2015 at Unknown time  . omeprazole (PRILOSEC) 20 MG capsule Take 20 mg by mouth daily.   05/10/2015 at Unknown time  . pantoprazole (PROTONIX) 40 MG tablet Take 40 mg by mouth daily.   05/10/2015 at Unknown time  . promethazine (PHENERGAN) 25 MG tablet Take  25 mg by mouth every 6 (six) hours as needed for nausea or vomiting.   Past Week at Unknown time  . topiramate (TOPAMAX) 50 MG tablet Take 50 mg by mouth daily.   05/10/2015 at Unknown time  . albuterol (PROVENTIL) (2.5 MG/3ML) 0.083% nebulizer solution Take 2.5 mg by nebulization every 6 (six) hours as needed for wheezing or shortness of breath. Reported on 05/10/2015   Not Taking at Unknown time   Scheduled: . aspirin EC  81 mg Oral Daily  . divalproex  500 mg Oral BID  . enoxaparin (LOVENOX) injection  40 mg Subcutaneous Q24H  . FLUoxetine  20 mg Oral Daily  . gabapentin  300 mg Oral BID  . lacosamide  200 mg Oral BID  . ondansetron (ZOFRAN) IV  4 mg Intravenous Once  . pantoprazole  40 mg Oral Daily  . topiramate  50 mg Oral Daily    ROS: History obtained from the patient  General ROS: negative for - chills, fatigue, fever, night sweats, weight gain or weight loss Psychological ROS: memory difficulties Ophthalmic ROS: as noted in HPI ENT ROS: negative for - epistaxis, nasal discharge, oral lesions, sore throat, tinnitus or vertigo Allergy and Immunology ROS: negative for - hives or itchy/watery eyes Hematological and Lymphatic ROS: negative for - bleeding problems, bruising or swollen lymph nodes Endocrine ROS: negative for - galactorrhea, hair pattern changes, polydipsia/polyuria or temperature intolerance Respiratory ROS: negative for - cough, hemoptysis, shortness of breath or wheezing Cardiovascular ROS: negative for - chest pain, dyspnea on exertion, edema or irregular heartbeat Gastrointestinal ROS: as noted in HPI Genito-Urinary ROS: negative for - dysuria, hematuria, incontinence or urinary frequency/urgency Musculoskeletal ROS: negative for - joint swelling or muscular weakness Neurological ROS: as noted in HPI Dermatological ROS: negative for rash and skin lesion changes  Physical Examination: Blood pressure 107/62, pulse 98, temperature 97.8 F (36.6 C), temperature  source Oral, resp. rate 18, height 5\' 3"  (1.6 m), weight 71.215 kg (157 lb), SpO2 98 %.  HEENT-  Normocephalic, lump on right forehead.  Normal external eye and conjunctiva.  Normal TM's bilaterally.  Normal auditory canals and external ears. Normal external nose, mucus membranes and septum.  Normal pharynx. Cardiovascular- S1, S2 normal, pulses palpable throughout   Lungs- chest clear, no wheezing, rales, normal symmetric air entry Abdomen- soft, non-tender; bowel sounds normal; no masses,  no organomegaly Extremities- no edema Lymph-no adenopathy palpable Musculoskeletal-no joint tenderness, deformity or swelling Skin-warm and dry, no hyperpigmentation, vitiligo, or suspicious lesions  Neurological Examination Mental Status: Alert, oriented, thought content appropriate.  Speech fluent without evidence of aphasia.  Patient purses her lips when she talks causing a slurring of speech.   Able to follow 3 step commands without difficulty. Cranial Nerves: II: Discs flat bilaterally;  Describes blurring form right eye but able to appreciate objects and colors and able to use cell phone, pupils equal, round, reactive to light and accommodation III,IV, VI: ptosis not present, extra-ocular motions intact bilaterally V,VII: smile symmetric, facial light touch sensation decreased on the left VIII: hearing normal bilaterally IX,X: gag reflex present XI: bilateral shoulder shrug XII: midline tongue extension Motor: Right : Upper extremity   5/5    Left:     Upper extremity   5/5  Lower extremity   5/5     Lower extremity   5/5 Tone and bulk:normal tone throughout; no atrophy noted Sensory: Pinprick and light touch decreased on the left Deep Tendon Reflexes: 2+ and symmetric throughout Plantars: Right: downgoing   Left: downgoing Cerebellar: Normal finger-to-nose and normal heel-to-shin testing bilaterally Gait: not tested due to safety concerns   Laboratory Studies:   Basic Metabolic  Panel:  Recent Labs Lab 05/10/15 1556 05/11/15 0550  NA 142 142  K 3.7 3.9  CL 111 110  CO2 23 28  GLUCOSE 76 94  BUN 9 10  CREATININE 0.86 0.89  CALCIUM 9.1 8.7*    Liver Function Tests:  Recent Labs Lab 05/10/15 1556  AST 19  ALT 12*  ALKPHOS 88  BILITOT 0.6  PROT 7.0  ALBUMIN 4.1    Recent Labs Lab 05/10/15 1556  LIPASE 41   No results for input(s): AMMONIA in the last 168 hours.  CBC:  Recent Labs Lab 05/10/15 1556 05/11/15 0550  WBC 5.9 6.1  HGB 13.2 12.4  HCT 38.4 35.7  MCV 86.8 86.9  PLT 257 232    Cardiac Enzymes: No results for input(s): CKTOTAL, CKMB, CKMBINDEX, TROPONINI in the last 168 hours.  BNP: Invalid input(s): POCBNP  CBG: No results for input(s): GLUCAP in the last 168 hours.  Microbiology: Results for orders placed or performed during the hospital encounter of 05/15/14  MRSA PCR Screening     Status: None   Collection Time: 05/15/14  5:25 PM  Result Value Ref Range Status   MRSA by PCR NEGATIVE NEGATIVE Final    Comment:        The GeneXpert MRSA Assay (FDA approved for NASAL specimens only), is one component of a comprehensive MRSA colonization surveillance program. It is not intended to diagnose MRSA infection nor to guide or monitor treatment for MRSA infections.     Coagulation Studies: No results for input(s): LABPROT, INR in the last 72 hours.  Urinalysis:  Recent Labs Lab 05/10/15 2330  COLORURINE YELLOW*  LABSPEC 1.012  PHURINE 6.0  GLUCOSEU NEGATIVE  HGBUR NEGATIVE  BILIRUBINUR NEGATIVE  KETONESUR NEGATIVE  PROTEINUR NEGATIVE  NITRITE NEGATIVE  LEUKOCYTESUR NEGATIVE    Lipid Panel:     Component Value Date/Time   TRIG 194* 05/15/2014 1855    HgbA1C: No results found for: HGBA1C  Urine Drug Screen:     Component Value Date/Time   LABOPIA NONE DETECTED 05/15/2014 1724   LABOPIA NEGATIVE 10/17/2013 1744   COCAINSCRNUR NONE DETECTED 05/15/2014 1724   LABBENZ POSITIVE* 05/15/2014 1724    LABBENZ POSITIVE 10/17/2013 1744   AMPHETMU NONE DETECTED 05/15/2014 1724   AMPHETMU NEGATIVE 10/17/2013 1744   THCU NONE DETECTED 05/15/2014 1724   THCU NEGATIVE 10/17/2013 1744   LABBARB NONE DETECTED 05/15/2014 1724   LABBARB NEGATIVE 10/17/2013 1744    Alcohol Level: No results for input(s): ETH in the last 168 hours.  Other results: EKG: normal sinus rhythm at 76 bpm.  Imaging: Ct Abdomen  Pelvis Wo Contrast  05/10/2015  CLINICAL DATA:  Left flank pain EXAM: CT ABDOMEN AND PELVIS WITHOUT CONTRAST TECHNIQUE: Multidetector CT imaging of the abdomen and pelvis was performed following the standard protocol without IV contrast. COMPARISON:  CT 11/24/2014 FINDINGS: Lower chest:  Lung bases clear. Hepatobiliary: Unenhanced images of liver are within normal limits. Normal liver size and contour. No liver lesion. Gallbladder contracted and normal. Bile ducts nondilated Pancreas: Negative Spleen: 2.8 cm calcific cyst in the spleen is unchanged and appears chronic and benign. Spleen is not enlarged. Adrenals/Urinary Tract: 3 mm nonobstructing left upper pole stone is unchanged. No other renal calculi. No mass or obstruction. Urinary bladder distended without focal abnormality. Stomach/Bowel: Negative for bowel obstruction. No bowel edema. Moderate amount of stool throughout the colon. Appendix normal. Negative for diverticulitis or mass. Vascular/Lymphatic: Negative Reproductive: Hysterectomy changes.  No pelvic mass. Other: No free fluid. Musculoskeletal: Negative IMPRESSION: No acute abnormality 3 mm nonobstructing left upper pole renal calculus Chronic calcified cyst in the spleen stable. Electronically Signed   By: Franchot Gallo M.D.   On: 05/10/2015 18:08   Ct Head Wo Contrast  05/10/2015  CLINICAL DATA:  Blow to the head today during a seizure. Initial encounter. EXAM: CT HEAD WITHOUT CONTRAST TECHNIQUE: Contiguous axial images were obtained from the base of the skull through the vertex without  intravenous contrast. COMPARISON:  Head CT scan 11/03/2014. FINDINGS: There is no evidence of acute intracranial abnormality including hemorrhage, infarct, mass lesion, mass effect, midline shift or abnormal extra-axial fluid collection. No hydrocephalus or pneumocephalus. The calvarium is intact. Imaged paranasal sinuses and mastoid air cells are clear. IMPRESSION: Negative head CT. Electronically Signed   By: Inge Rise M.D.   On: 05/10/2015 15:25     Assessment/Plan: 45 year old female presenting with breakthrough seizures.  Many features do suggest a nonepileptic etiology but patient felt to have epileptic seizures as well on Topamax, Vimpat and Depakote.  Depakote level 77.  Head CT personally reviewed and shows no acute changes.  Patient reports has had improvement in seizure frequency with addition of Vimpat until recently.  Relates vision abnormalities to head trauma with one of her seizures.  Has not sought outpatient evaluation.    Recommendations: 1.  Ophthalmology to evaluate vision.  May be done as an outpatient.   2.  Increase Vimpat to 200mg  BID 3.  Seizure precautions 4.  Would hold narcotics since this may further decrease seizure threshold.   5.  MRI of the brain without contrast  [name], MD Neurology [phone] 05/11/2015, 12:00 PM

## 2015-05-11 NOTE — Progress Notes (Signed)
Rapid response called according to RN pt became unresponsive with fainting episode, vss, nsr on cardiac monitor, pt lethargic however opens eyes when verbally stimulated; Dr. Posey Pronto at bedside pt stabilized and to remain in assigned room

## 2015-05-11 NOTE — Progress Notes (Signed)
Pt became unresponsive with a fainting episode. Pt has a positive pulse and responds to vigorous stimuli according to other nursing staff. Upon this RN reporting to the room the patient was in the bed with staff surrounding the patient checking vital signs. Pt is verbally responsive, slow and low volume speech. Pt is able to recall breakfast this AM. Called rapid response and called MD to bedside.

## 2015-05-11 NOTE — Progress Notes (Signed)
Explained to patient and husband that we are waiting for MRI to ensure stability of patient's condition. Spoke to MRI tech, was informed that study may happen in the morning. Explained to patient and husband and they verbally understand. Patient is sitting up in bed, A&O x 4, speaking clearly, awake and non-drowsy, respirations are even and unlabored.

## 2015-05-11 NOTE — Progress Notes (Signed)
Patient ID: [name], female   DOB: Mar 25, 1970, 45 y.o.   MRN: [id number] Rapid response was called and patient went to the bathroom and had a spell of possible passing out. No witnessed seizures was noted by either was in the bathroom with the patient. She is hemodynamically stable. She was evaluated thereafter by Dr. [name]. Do not think these are true seizures although patient is convinced she being on 3 antiepileptic medications that this is just seizures. Her EEG in the past have been negative. CT of the head has been negative as well. I spoke with patient's outpatient neurologist Dr. Melrose Nakayama who also failed the same all as these not being true seizures. Psychiatry consult has been recommended.  Patient was seen by Dr. Lissa Hoard [facility] were also claims these are not true seizures. He is recommended to continue patient's psych medications for depression she has been on for a while. At this present time will get MRI of the brain done and if it remains negative patient is hemodynamically stable we will discharge patient to home. Not sure if patient and husband will be willing to go home but workup has essentially been negative so far.

## 2015-05-11 NOTE — Discharge Instructions (Signed)
DO NOT drive or operate heavy machinery

## 2015-05-11 NOTE — Discharge Summary (Addendum)
[facility] [place] NAME: [name]    MR#:  [id number]  DATE OF BIRTH:  May 10, 1970  DATE OF ADMISSION:  05/10/2015 ADMITTING PHYSICIAN: Vaughan Basta, MD  DATE OF DISCHARGE: 05/11/15  PRIMARY CARE PHYSICIAN: Singh,Jasmine, MD    ADMISSION DIAGNOSIS:  Seizure ([place]) [R56.9]  DISCHARGE DIAGNOSIS:  Headache H/o Chronic seizure(with negative workup in the past) vs pseudoseizures  SECONDARY DIAGNOSIS:   Past Medical History  Diagnosis Date  . Anxiety   . Arthritis   . Asthma   . Migraines   . Depression   . Emphysema/COPD ([place])   . Sleep apnea   . Stroke ([place])   . Seizure [facility])     HOSPITAL COURSE:  * chronic seizures  Not sure if this is pseudoseizures  received IV vimpat 200 mg ---change to po 150 mg bid Unsure about pt's compliance to meds. No active seizures noted by staff here  CT head is negative. Pt reports she is suppose to go to [facility] for 2nd opinion Spoke with Dr Doy Mince  And agrees with above plan.  -MRI of the brain will be done prior to discharge.  * Migraine headache. Some visual disturbance on her right eye.  Possibly this is a presentation of migraine   CT head is negative current  * Abdominal pain left Lower back CT scan of the abdomen is negative.  No complaint of constipation. no indication for IV pain meds.   * History of stroke  Continue aspirin.  * Depression continue fluoxetine. Patient was evaluated by Dr. Weber Cooks. He also thinks that this is not true seizures and could be pseudoseizures.  * Smoking Counseled to quit smoking for 4 minutes.  Will d/c to home after seen by dr Doy Mince and f/u dr Melrose Nakayama and [facility] neurology  CONSULTS OBTAINED:  Treatment Team:  [name], MD Gonzella Lex, MD  DRUG ALLERGIES:   Allergies  Allergen Reactions  . Bee Venom Anaphylaxis  . Shellfish Allergy Anaphylaxis  . Contrast Media [Iodinated Diagnostic Agents]  Other (See Comments)    Reaction:  Unknown   . Penicillins Other (See Comments)    Reaction:  Seizures  Has patient had a PCN reaction causing immediate rash, facial/tongue/throat swelling, SOB or lightheadedness with hypotension: No Has patient had a PCN reaction causing severe rash involving mucus membranes or skin necrosis: No Has patient had a PCN reaction that required hospitalization Yes Has patient had a PCN reaction occurring within the last 10 years: Yes If all of the above answers are "NO", then may proceed with Cephalosporin use.  . Sulfa Antibiotics Itching    DISCHARGE MEDICATIONS:   Current Discharge Medication List    CONTINUE these medications which have CHANGED   Details  lacosamide (VIMPAT) 200 MG TABS tablet Take 1 tablet (200 mg total) by mouth 2 (two) times daily. Qty: 60 tablet, Refills: 0      CONTINUE these medications which have NOT CHANGED   Details  acetaminophen (TYLENOL) 325 MG tablet Take 325-650 mg by mouth every 6 (six) hours as needed for mild pain or headache.    albuterol (PROVENTIL HFA;VENTOLIN HFA) 108 (90 BASE) MCG/ACT inhaler Inhale 1-2 puffs into the lungs every 6 (six) hours as needed for wheezing or shortness of breath.    aspirin EC 81 MG tablet Take 81 mg by mouth daily.    butalbital-aspirin-caffeine (FIORINAL) 50-325-40 MG capsule Take 1 capsule by mouth daily as needed for headache.  divalproex (DEPAKOTE) 250 MG DR tablet Take 500 mg by mouth 2 (two) times daily.    EPINEPHrine (EPIPEN 2-PAK) 0.3 mg/0.3 mL IJ SOAJ injection Inject 0.3 mg into the muscle once as needed (for severe allergic reaction).    FLUoxetine (PROZAC) 20 MG capsule Take 20 mg by mouth daily.    gabapentin (NEURONTIN) 300 MG capsule Take 300 mg by mouth 2 (two) times daily.    omeprazole (PRILOSEC) 20 MG capsule Take 20 mg by mouth daily.    pantoprazole (PROTONIX) 40 MG tablet Take 40 mg by mouth daily.    promethazine (PHENERGAN) 25 MG tablet Take 25 mg  by mouth every 6 (six) hours as needed for nausea or vomiting.    topiramate (TOPAMAX) 50 MG tablet Take 50 mg by mouth daily.    albuterol (PROVENTIL) (2.5 MG/3ML) 0.083% nebulizer solution Take 2.5 mg by nebulization every 6 (six) hours as needed for wheezing or shortness of breath. Reported on 05/10/2015        If you experience worsening of your admission symptoms, develop shortness of breath, life threatening emergency, suicidal or homicidal thoughts you must seek medical attention immediately by calling 911 or calling your MD immediately  if symptoms less severe.  You Must read complete instructions/literature along with all the possible adverse reactions/side effects for all the Medicines you take and that have been prescribed to you. Take any new Medicines after you have completely understood and accept all the possible adverse reactions/side effects.   Please note  You were cared for by a hospitalist during your hospital stay. If you have any questions about your discharge medications or the care you received while you were in the hospital after you are discharged, you can call the unit and asked to speak with the hospitalist on call if the hospitalist that took care of you is not available. Once you are discharged, your primary care physician will handle any further medical issues. Please note that NO REFILLS for any discharge medications will be authorized once you are discharged, as it is imperative that you return to your primary care physician (or establish a relationship with a primary care physician if you do not have one) for your aftercare needs so that they can reassess your need for medications and monitor your lab values. Today   SUBJECTIVE   Some pain left lower flank  VITAL SIGNS:  Blood pressure 100/60, pulse 71, temperature 98.3 F (36.8 C), temperature source Oral, resp. rate 20, height 5\' 3"  (1.6 m), weight 71.215 kg (157 lb), SpO2 98 %.  I/O:    Intake/Output  Summary (Last 24 hours) at 05/11/15 1409 Last data filed at 05/11/15 1300  Gross per 24 hour  Intake    240 ml  Output    200 ml  Net     40 ml    PHYSICAL EXAMINATION:  GENERAL:  45 y.o.-year-old patient lying in the bed with no acute distress.  EYES: Pupils equal, round, reactive to light and accommodation. No scleral icterus. Extraocular muscles intact.  HEENT: Head atraumatic, normocephalic. Oropharynx and nasopharynx clear.  NECK:  Supple, no jugular venous distention. No thyroid enlargement, no tenderness.  LUNGS: Normal breath sounds bilaterally, no wheezing, rales,rhonchi or crepitation. No use of accessory muscles of respiration.  CARDIOVASCULAR: S1, S2 normal. No murmurs, rubs, or gallops.  ABDOMEN: Soft, non-tender, non-distended. Bowel sounds present. No organomegaly or mass.  EXTREMITIES: No pedal edema, cyanosis, or clubbing.  NEUROLOGIC: Cranial nerves II through XII are  intact. Muscle strength 5/5 in all extremities. Sensation intact. Gait not checked.  PSYCHIATRIC: The patient is alert and oriented x 3.  SKIN: No obvious rash, lesion, or ulcer.   DATA REVIEW:   CBC   Recent Labs Lab 05/11/15 0550  WBC 6.1  HGB 12.4  HCT 35.7  PLT 232    Chemistries   Recent Labs Lab 05/10/15 1556 05/11/15 0550  NA 142 142  K 3.7 3.9  CL 111 110  CO2 23 28  GLUCOSE 76 94  BUN 9 10  CREATININE 0.86 0.89  CALCIUM 9.1 8.7*  AST 19  --   ALT 12*  --   ALKPHOS 88  --   BILITOT 0.6  --     Microbiology Results   No results found for this or any previous visit (from the past 240 hour(s)).  RADIOLOGY:  Ct Abdomen Pelvis Wo Contrast  05/10/2015  CLINICAL DATA:  Left flank pain EXAM: CT ABDOMEN AND PELVIS WITHOUT CONTRAST TECHNIQUE: Multidetector CT imaging of the abdomen and pelvis was performed following the standard protocol without IV contrast. COMPARISON:  CT 11/24/2014 FINDINGS: Lower chest:  Lung bases clear. Hepatobiliary: Unenhanced images of liver are within  normal limits. Normal liver size and contour. No liver lesion. Gallbladder contracted and normal. Bile ducts nondilated Pancreas: Negative Spleen: 2.8 cm calcific cyst in the spleen is unchanged and appears chronic and benign. Spleen is not enlarged. Adrenals/Urinary Tract: 3 mm nonobstructing left upper pole stone is unchanged. No other renal calculi. No mass or obstruction. Urinary bladder distended without focal abnormality. Stomach/Bowel: Negative for bowel obstruction. No bowel edema. Moderate amount of stool throughout the colon. Appendix normal. Negative for diverticulitis or mass. Vascular/Lymphatic: Negative Reproductive: Hysterectomy changes.  No pelvic mass. Other: No free fluid. Musculoskeletal: Negative IMPRESSION: No acute abnormality 3 mm nonobstructing left upper pole renal calculus Chronic calcified cyst in the spleen stable. Electronically Signed   By: Franchot Gallo M.D.   On: 05/10/2015 18:08   Ct Head Wo Contrast  05/10/2015  CLINICAL DATA:  Blow to the head today during a seizure. Initial encounter. EXAM: CT HEAD WITHOUT CONTRAST TECHNIQUE: Contiguous axial images were obtained from the base of the skull through the vertex without intravenous contrast. COMPARISON:  Head CT scan 11/03/2014. FINDINGS: There is no evidence of acute intracranial abnormality including hemorrhage, infarct, mass lesion, mass effect, midline shift or abnormal extra-axial fluid collection. No hydrocephalus or pneumocephalus. The calvarium is intact. Imaged paranasal sinuses and mastoid air cells are clear. IMPRESSION: Negative head CT. Electronically Signed   By: Inge Rise M.D.   On: 05/10/2015 15:25     Management plans discussed with the patient, family and they are in agreement.  CODE STATUS:     Code Status Orders        Start     Ordered   05/10/15 2303  Full code   Continuous     05/10/15 2302    Code Status History    Date Active Date Inactive Code Status Order ID Comments User Context    11/03/2014 11:20 PM 11/04/2014  7:18 PM Full Code [id number]  Lytle Butte, MD ED    Advance Directive Documentation        Most Recent Value   Type of Advance Directive  Healthcare Power of [place], Living will   Pre-existing out of facility DNR order (yellow form or pink MOST form)     "MOST" Form in Place?  TOTAL TIME TAKING CARE OF THIS PATIENT: 40 minutes.    Laval Cafaro M.D on 05/11/2015 at 2:09 PM  Between 7am to 6pm - Pager - [phone] After 6pm go to [email or web address] - password [email or web address]  Office  [phone]  CC: Primary care physician; Glendon Axe, MD

## 2015-05-11 NOTE — Evaluation (Signed)
Physical Therapy Evaluation Patient Details Name: [name] MRN: [id number] DOB: Dec 02, 1970 Today's Date: 05/11/2015   History of Present Illness  Pt admitted for seizures. Pt complains of ambulating to bathroom with husband and hitting head. Since being in hospital, rapid response called as pt had episode of passing out. Pt has been classified as more pseudoseizures.  Pt with history of anxiety, asthma, and depression.  Clinical Impression  Pt is a pleasant 45 year old female who was admitted for pseudoseizures. Pt performs bed mobility independently, transfers with supervision, and ambulation with cga and rw. Pt with inconsistencies with slurred speech at times and clear speech at times. Pt appears weak at first, however then able to perform all supine there-ex with no assistance. Pt demonstrates deficits with endurance/strength. Would benefit from skilled PT to address above deficits and promote optimal return to PLOF.      Follow Up Recommendations Outpatient PT;Supervision/Assistance - 24 hour    Equipment Recommendations       Recommendations for Other Services       Precautions / Restrictions Precautions Precautions: Fall Restrictions Weight Bearing Restrictions: No      Mobility  Bed Mobility Overal bed mobility: Independent             General bed mobility comments: safe technique performed  Transfers Overall transfer level: Needs assistance Equipment used: Rolling walker (2 wheeled) Transfers: Sit to/from Stand Sit to Stand: Supervision         General transfer comment: safe technique performed although pt is impulsive and tries to get up prior to therapist instruction  Ambulation/Gait Ambulation/Gait assistance: Min guard Ambulation Distance (Feet): 40 Feet Assistive device: Rolling walker (2 wheeled) Gait Pattern/deviations: Step-through pattern     General Gait Details: safe technique performed with reciprocal gait pattern noted. Pt with no  unsteadiness noted. Safe technique  Stairs            Wheelchair Mobility    Modified Rankin (Stroke Patients Only)       Balance Overall balance assessment: History of Falls;Needs assistance Sitting-balance support: Feet supported Sitting balance-Leahy Scale: Normal     Standing balance support: Bilateral upper extremity supported Standing balance-Leahy Scale: Good                               Pertinent Vitals/Pain Pain Assessment: Faces Faces Pain Scale: Hurts little more Pain Location: headache Pain Descriptors / Indicators: Discomfort Pain Intervention(s): Limited activity within patient's tolerance    Home Living Family/patient expects to be discharged to:: Private residence Living Arrangements: Spouse/significant other Available Help at Discharge: Family Type of Home: House Home Access: Stairs to enter Entrance Stairs-Rails: Can reach both Entrance Stairs-Number of Steps: 3 Home Layout: One level Home Equipment: Environmental consultant - 2 wheels (tri-cane)      Prior Function Level of Independence: Independent with assistive device(s)         Comments: uses tri cane most of the time, household ambulator     Hand Dominance        Extremity/Trunk Assessment   Upper Extremity Assessment: Overall WFL for tasks assessed           Lower Extremity Assessment: Generalized weakness (grossly 4/5)         Communication   Communication: No difficulties  Cognition Arousal/Alertness: Awake/alert Behavior During Therapy: WFL for tasks assessed/performed Overall Cognitive Status: Within Functional Limits for tasks assessed  General Comments      Exercises Other Exercises Other Exercises: Pt ambulated to [facility] with supervision and pt slightly impulsive. Needs supervision for hygiene tasks. Safe technique performed Other Exercises: Supine ther-ex performed including B LE ankle pumps, quad sets, SLRs, hip abd/add, and  heel slides. All ther-ex performed x 10 reps with cues for sequencing and supervision      Assessment/Plan    PT Assessment Patient needs continued PT services  PT Diagnosis Difficulty walking;Generalized weakness   PT Problem List Decreased strength;Decreased balance;Decreased knowledge of use of DME;Decreased safety awareness  PT Treatment Interventions Gait training;DME instruction;Therapeutic exercise   PT Goals (Current goals can be found in the Care Plan section) Acute Rehab PT Goals Patient Stated Goal: to go home PT Goal Formulation: With patient Time For Goal Achievement: 05/25/15 Potential to Achieve Goals: Good    Frequency Min 2X/week   Barriers to discharge        Co-evaluation               End of Session Equipment Utilized During Treatment: Gait belt Activity Tolerance: Patient tolerated treatment well Patient left: in bed;with bed alarm set Nurse Communication: Mobility status         Time: 1359-1425 PT Time Calculation (min) (ACUTE ONLY): 26 min   Charges:   PT Evaluation $PT Eval Moderate Complexity: 1 Procedure PT Treatments $Therapeutic Exercise: 8-22 mins $Therapeutic Activity: 8-22 mins   PT G Codes:        Janella Rogala 05-30-15, 5:18 PM Greggory Stallion, PT, DPT [phone]

## 2015-05-11 NOTE — Care Management Important Message (Signed)
Important Message  Patient Details  Name: [name] MRN: [id number] Date of Birth: [birth year]   Medicare Important Message Given:  Yes    Shelbie Ammons, RN 05/11/2015, 9:10 AM

## 2015-05-11 NOTE — Plan of Care (Signed)
Problem: Pain Managment: Goal: General experience of comfort will improve Outcome: Progressing Pt c/o left abdominal pain, 2 mg IV morphine decreased pain. Pt c/o migraine, Fiorcet given with relief. Continue to assess.

## 2015-05-11 NOTE — Consult Note (Signed)
[facility] Face-to-Face Psychiatry Consult   Reason for Consult:  Consult for this 45 year old woman with a history of recurrent seizure-like behavior. Discharged today was complicated by an episode. Consult for any psychiatric insight and possibility of depression or anxiety. Referring Physician:  Posey Pronto Patient Identification: [name] MRN:  [id number] Principal Diagnosis: Pseudoseizures Diagnosis:   Patient Active Problem List   Diagnosis Date Noted  . Pseudoseizures [F44.5] 05/11/2015  . Abdominal pain [R10.9] 05/10/2015  . Seizures ([place]) [R56.9] 05/17/2014  . Respiratory failure, acute ([place]) [J96.00] 05/16/2014  . COPD (chronic obstructive pulmonary disease) ([place]) [J44.9]   . Emphysema/COPD ([place]) [J43.9]   . Sleep apnea [G47.30]   . Stroke [facility]) [I63.9]   . Seizure ([place]) [R56.9]     Total Time spent with patient: 1 hour  Subjective:   [name] is a 45 y.o. female patient admitted with "I'm having a bad headache had a seizure".  HPI:  Patient interviewed. Her husband was also present. Patient appeared very comfortable talking in front of her husband and her husband contributed some history as well. Chart reviewed. I read with some detail her old notes including the previous neurology notes, the results of her previous EEGs, my previous psychiatric evaluation. Labs and vitals reviewed. 45 year old woman has been suffering from recurrent spells for about 7 years. She came into the hospital this time after having what was presumed to be another spell at home in which she fell down and struck her head. Patient and her husband report that she probably is having more than one of these episodes per week although frequency is less than when she was not taking medication. Patient also has chronic migraine headaches and indicates that she usually has more than one of these a week and that at times they will last several days at a time with the result being that she seems to spend more time  with a headache than without. This is also been present for years. As far as acute psychiatric symptoms the patient denies feeling like her mood is depressed or down. She says she feels good about her life. She named several positive things in her life including her pets and her relationship with her husband. She indicates she has several very positive thing she is looking forward to in her life. She denies feeling hopeless or helpless. She says that she usually sleeps reasonably well and her appetite is been fine. She absolutely denies any suicidal ideation. Denies any hallucinations or psychotic symptoms and shows no evidence of paranoia. Patient denies abuse of alcohol or drugs. Patient obviously does not not like having her symptoms but she denies feeling panicky or excessively anxious about them. Her spells have been worked up in the past. She has had at least 2 EEG studies over somewhat long periods of time neither of which have ever shown any indication of epileptiform activity. Her spells as they have been described several times and the chart particularly when observed by neurologist do not sound very typical of epileptic-like seizures. Her seizures have not responded acutely to benzodiazepine administration which is very unusual and have shown only a partial and inconsistent improvement  even with high doses of multiple antiepileptic medicines.   Social history: Patient is married. She does not work outside the home. She has no children. She has several pets whom she refers to as her children. Husband appears to be devoted and provides a lot of care to her and the patient appears to be pleasantly dependent on  him.  Medical history: Most of her medical history is dominated by the spells as noted above. She also has a past history of emphysema past history of sleep apnea and I see at least one mention of stroke although I'm not sure I can confirm that is been an issue.  Substance abuse history: Patient  denies any alcohol abuse denies any drug abuse is no indication of any alcohol or drug abuse.  Past Psychiatric History: Patient states that she saw a therapist around 81 when her 1 child that she did have died. She was started on fluoxetine at that time. She has remained on it ever since. Never had psychiatric hospitalization never had any suicide attempts never had anything like a manic episode. Not currently seeing anyone for specifically mental health treatment.  Risk to Self: Is patient at risk for suicide?: No Risk to Others:   patient does not appear to pose any risk to others Prior Inpatient Therapy:   no prior inpatient treatment Prior Outpatient Therapy:   has had prior outpatient treatment years ago currently only receives chronic Prozac  Past Medical History:  Past Medical History  Diagnosis Date  . Anxiety   . Arthritis   . Asthma   . Migraines   . Depression   . Emphysema/COPD ([place])   . Sleep apnea   . Stroke ([place])   . Seizure [facility])     Past Surgical History  Procedure Laterality Date  . Cesarean section  1993  . Partial hysterectomy  1995  . Abdominal hysterectomy      partial  . Tonsillectomy     Family History:  Family History  Problem Relation Age of Onset  . Diabetes Neg Hx   . Lung cancer Mother    Family Psychiatric  History: Patient does not know of any family history of any mental health problems. She says she did have an aunt who also had seizures and headaches similar to hers Social History:  History  Alcohol Use No     History  Drug Use No    Social History   Social History  . Marital Status: Married    Spouse Name: N/A  . Number of Children: N/A  . Years of Education: N/A   Social History Main Topics  . Smoking status: Current Every Day Smoker    Types: E-cigarettes  . Smokeless tobacco: Never Used  . Alcohol Use: No  . Drug Use: No  . Sexual Activity: Not Asked   Other Topics Concern  . None   Social History Narrative    Patient is married and lives at home with her husband.             Additional Social History:    Allergies:   Allergies  Allergen Reactions  . Bee Venom Anaphylaxis  . Shellfish Allergy Anaphylaxis  . Contrast Media [Iodinated Diagnostic Agents] Other (See Comments)    Reaction:  Unknown   . Penicillins Other (See Comments)    Reaction:  Seizures  Has patient had a PCN reaction causing immediate rash, facial/tongue/throat swelling, SOB or lightheadedness with hypotension: No Has patient had a PCN reaction causing severe rash involving mucus membranes or skin necrosis: No Has patient had a PCN reaction that required hospitalization Yes Has patient had a PCN reaction occurring within the last 10 years: Yes If all of the above answers are "NO", then may proceed with Cephalosporin use.  . Sulfa Antibiotics Itching    Labs:  Results for orders  placed or performed during the hospital encounter of 05/10/15 (from the past 48 hour(s))  Lipase, blood     Status: None   Collection Time: 05/10/15  3:56 PM  Result Value Ref Range   Lipase 41 11 - 51 U/L  Comprehensive metabolic panel     Status: Abnormal   Collection Time: 05/10/15  3:56 PM  Result Value Ref Range   Sodium 142 135 - 145 mmol/L   Potassium 3.7 3.5 - 5.1 mmol/L   Chloride 111 101 - 111 mmol/L   CO2 23 22 - 32 mmol/L   Glucose, Bld 76 65 - 99 mg/dL   BUN 9 6 - 20 mg/dL   Creatinine, Ser 0.86 0.44 - 1.00 mg/dL   Calcium 9.1 8.9 - 10.3 mg/dL   Total Protein 7.0 6.5 - 8.1 g/dL   Albumin 4.1 3.5 - 5.0 g/dL   AST 19 15 - 41 U/L   ALT 12 (L) 14 - 54 U/L   Alkaline Phosphatase 88 38 - 126 U/L   Total Bilirubin 0.6 0.3 - 1.2 mg/dL   GFR calc non Af Amer >60 >60 mL/min   GFR calc Af Amer >60 >60 mL/min    Comment: (NOTE) The eGFR has been calculated using the CKD EPI equation. This calculation has not been validated in all clinical situations. eGFR's persistently <60 mL/min signify possible Chronic Kidney Disease.     Anion gap 8 5 - 15  CBC     Status: None   Collection Time: 05/10/15  3:56 PM  Result Value Ref Range   WBC 5.9 3.6 - 11.0 K/uL   RBC 4.43 3.80 - 5.20 MIL/uL   Hemoglobin 13.2 12.0 - 16.0 g/dL   HCT 38.4 35.0 - 47.0 %   MCV 86.8 80.0 - 100.0 fL   MCH 29.7 26.0 - 34.0 pg   MCHC 34.3 32.0 - 36.0 g/dL   RDW 11.9 11.5 - 14.5 %   Platelets 257 150 - 440 K/uL  Valproic acid level     Status: None   Collection Time: 05/10/15  3:56 PM  Result Value Ref Range   Valproic Acid Lvl 77 50.0 - 100.0 ug/mL  Urinalysis complete, with microscopic ([facility] only)     Status: Abnormal   Collection Time: 05/10/15 11:30 PM  Result Value Ref Range   Color, Urine YELLOW (A) YELLOW   APPearance CLEAR (A) CLEAR   Glucose, UA NEGATIVE NEGATIVE mg/dL   Bilirubin Urine NEGATIVE NEGATIVE   Ketones, ur NEGATIVE NEGATIVE mg/dL   Specific Gravity, Urine 1.012 1.005 - 1.030   Hgb urine dipstick NEGATIVE NEGATIVE   pH 6.0 5.0 - 8.0   Protein, ur NEGATIVE NEGATIVE mg/dL   Nitrite NEGATIVE NEGATIVE   Leukocytes, UA NEGATIVE NEGATIVE   RBC / HPF NONE SEEN 0 - 5 RBC/hpf   WBC, UA 0-5 0 - 5 WBC/hpf   Bacteria, UA NONE SEEN NONE SEEN   Squamous Epithelial / LPF 0-5 (A) NONE SEEN  Basic metabolic panel     Status: Abnormal   Collection Time: 05/11/15  5:50 AM  Result Value Ref Range   Sodium 142 135 - 145 mmol/L   Potassium 3.9 3.5 - 5.1 mmol/L   Chloride 110 101 - 111 mmol/L   CO2 28 22 - 32 mmol/L   Glucose, Bld 94 65 - 99 mg/dL   BUN 10 6 - 20 mg/dL   Creatinine, Ser 0.89 0.44 - 1.00 mg/dL   Calcium 8.7 (L) 8.9 - 10.3  mg/dL   GFR calc non Af Amer >60 >60 mL/min   GFR calc Af Amer >60 >60 mL/min    Comment: (NOTE) The eGFR has been calculated using the CKD EPI equation. This calculation has not been validated in all clinical situations. eGFR's persistently <60 mL/min signify possible Chronic Kidney Disease.    Anion gap 4 (L) 5 - 15  CBC     Status: None   Collection Time: 05/11/15  5:50 AM  Result  Value Ref Range   WBC 6.1 3.6 - 11.0 K/uL   RBC 4.11 3.80 - 5.20 MIL/uL   Hemoglobin 12.4 12.0 - 16.0 g/dL   HCT 35.7 35.0 - 47.0 %   MCV 86.9 80.0 - 100.0 fL   MCH 30.1 26.0 - 34.0 pg   MCHC 34.6 32.0 - 36.0 g/dL   RDW 12.0 11.5 - 14.5 %   Platelets 232 150 - 440 K/uL    Current Facility-Administered Medications  Medication Dose Route Frequency Provider Last Rate Last Dose  . acetaminophen (TYLENOL) tablet 325-650 mg  325-650 mg Oral Q6H PRN Vaughan Basta, MD      . albuterol (PROVENTIL) (2.5 MG/3ML) 0.083% nebulizer solution 2.5 mg  2.5 mg Nebulization Q6H PRN Vaughan Basta, MD      . aspirin EC tablet 81 mg  81 mg Oral Daily Vaughan Basta, MD   81 mg at 05/11/15 0954  . divalproex (DEPAKOTE) DR tablet 500 mg  500 mg Oral BID Vaughan Basta, MD   500 mg at 05/11/15 0954  . enoxaparin (LOVENOX) injection 40 mg  40 mg Subcutaneous Q24H Darylene Price Thompson, [facility]      . FLUoxetine (PROZAC) capsule 20 mg  20 mg Oral Daily Vaughan Basta, MD   20 mg at 05/11/15 0953  . gabapentin (NEURONTIN) capsule 300 mg  300 mg Oral BID Vaughan Basta, MD   300 mg at 05/11/15 0953  . lacosamide (VIMPAT) tablet 200 mg  200 mg Oral BID [name], MD      . ondansetron [facility]) injection 4 mg  4 mg Intravenous Once Orbie Pyo, MD   4 mg at 05/10/15 1739  . pantoprazole (PROTONIX) EC tablet 40 mg  40 mg Oral Daily Vaughan Basta, MD   40 mg at 05/11/15 0953  . promethazine (PHENERGAN) tablet 25 mg  25 mg Oral Q6H PRN Vaughan Basta, MD   25 mg at 05/11/15 0605  . topiramate (TOPAMAX) tablet 50 mg  50 mg Oral Daily Vaughan Basta, MD   50 mg at 05/11/15 2863    Musculoskeletal: Strength & Muscle Tone: decreased Gait & Station: unsteady Patient leans: N/A  Psychiatric Specialty Exam: Review of Systems  Eyes: Negative.   Respiratory: Negative.   Cardiovascular: Negative.   Gastrointestinal: Negative.   Musculoskeletal:  Negative.   Skin: Negative.   Neurological: Positive for speech change, seizures, weakness and headaches.  Psychiatric/Behavioral: Positive for memory loss. Negative for depression, suicidal ideas, hallucinations and substance abuse. The patient is not nervous/anxious and does not have insomnia.     Blood pressure 107/62, pulse 98, temperature 97.8 F (36.6 C), temperature source Oral, resp. rate 18, height $RemoveBe'5\' 3"'RvospSzNK$  (1.6 m), weight 71.215 kg (157 lb), SpO2 98 %.Body mass index is 27.82 kg/(m^2).  General Appearance: Fairly Groomed  Engineer, water::  Fair  Speech:  Slow and Slurred  Volume:  Decreased  Mood:  Euthymic  Affect:  Constricted  Thought Process:  Goal Directed  Orientation:  Full (Time, Place, and Person)  Thought Content:  Negative  Suicidal Thoughts:  No  Homicidal Thoughts:  No  Memory:  Immediate;   Fair Recent;   Poor Remote;   Fair  Judgement:  Fair  Insight:  Lacking  Psychomotor Activity:  Psychomotor Retardation  Concentration:  Fair  Recall:  Poor  Fund of Knowledge:Fair  Language: Fair  Akathisia:  No  Handed:  Right  AIMS (if indicated):     Assets:  Communication Skills Desire for Improvement Financial Resources/Insurance Housing Resilience Social Support  ADL's:  Impaired  Cognition: Impaired,  Mild  Sleep:      Treatment Plan Summary: Plan This is a 45 year old woman who has a history of recurrent spells. I have not referred to them in this note as "seizures" because it seems to me rather clear that the patient does not have true epileptic seizures. On the other hand the patient strongly believes that that is what she does have and has no insight or idea that there may be any kind of psychiatric or mental health component to her spells. She has an outpatient neurologist who is continuing to treat her with antiepileptic medicines even though she says that she feels that he doesn't believe that she really has seizures. She is denying any symptoms of  depression any significant anxieties she has no psychosis and there is no evidence of her being suicidal or homicidal or acutely dangerous. In the situation I don't think there is really any active treatment I can recommend. Patient is clearly not open to considering that her problem may have a partially psychosomatic component to it and only gets insulted at the suggestion. There is no evidence of any psychiatric illness that is amenable to treatment or requires hospitalization for safety. Supportive counseling to the patient and encouraged her to keep up her good attitude and to keep working with her providers until she feels satisfied with her treatment. Case reviewed with hospitalist. No change to her current fluoxetine which is a stable medicine.  Disposition: Patient does not meet criteria for psychiatric inpatient admission. Supportive therapy provided about ongoing stressors.  Alethia Berthold, MD 05/11/2015 1:33 PM

## 2015-05-11 NOTE — Progress Notes (Signed)
Pt states she had a small  seizure around 0600. VSS, patient in no distress when she was checked. Continue to assess.

## 2015-05-12 NOTE — Progress Notes (Signed)
Subjective: Patient reports that she wishes to go home today.  Patient slept through the night with no further seizure activity noted.  Reports continued left eye decrease in vision.    Objective: Current vital signs: BP 113/59 mmHg  Pulse 96  Temp(Src) 98.5 F (36.9 C) (Oral)  Resp 16  Ht 5\' 3"  (1.6 m)  Wt 71.215 kg (157 lb)  BMI 27.82 kg/m2  SpO2 100%  LMP  (LMP Unknown) Vital signs in last 24 hours: Temp:  [97.4 F (36.3 C)-98.5 F (36.9 C)] 98.5 F (36.9 C) (03/08 0837) Pulse Rate:  [71-98] 96 (03/08 0837) Resp:  [16-20] 16 (03/08 0837) BP: (82-113)/(46-62) 113/59 mmHg (03/08 0837) SpO2:  [98 %-100 %] 100 % (03/08 0837)  Intake/Output from previous day: 03/07 0701 - 03/08 0700 In: 480 [P.O.:480] Out: 1450 [Urine:1450] Intake/Output this shift: Total I/O In: 240 [P.O.:240] Out: -  Nutritional status: Diet Heart Room service appropriate?: Yes; Fluid consistency:: Thin  Neurologic Exam: Mental Status: Alert, oriented, thought content appropriate. Speech fluent without evidence of aphasia. Patient purses her lips when she talks. Able to follow 3 step commands without difficulty. Cranial Nerves: II: Discs flat bilaterally; Describes blurring form right eye but able to appreciate objects and colors and able to use cell phone, pupils equal, round, reactive to light and accommodation III,IV, VI: ptosis not present, extra-ocular motions intact bilaterally V,VII: smile symmetric, facial light touch sensation decreased on the left VIII: hearing normal bilaterally IX,X: gag reflex present XI: bilateral shoulder shrug XII: midline tongue extension Motor: Right :Upper extremity 5/5Left: Upper extremity 5/5 Lower extremity 5/5Lower extremity 5/5 Tone and bulk:normal tone throughout; no atrophy noted Sensory: Pinprick and light touch decreased on the left Gait:  Ambulating with walker  Lab Results: Basic Metabolic Panel:  Recent Labs Lab 05/10/15 1556 05/11/15 0550  NA 142 142  K 3.7 3.9  CL 111 110  CO2 23 28  GLUCOSE 76 94  BUN 9 10  CREATININE 0.86 0.89  CALCIUM 9.1 8.7*    Liver Function Tests:  Recent Labs Lab 05/10/15 1556  AST 19  ALT 12*  ALKPHOS 88  BILITOT 0.6  PROT 7.0  ALBUMIN 4.1    Recent Labs Lab 05/10/15 1556  LIPASE 41   No results for input(s): AMMONIA in the last 168 hours.  CBC:  Recent Labs Lab 05/10/15 1556 05/11/15 0550  WBC 5.9 6.1  HGB 13.2 12.4  HCT 38.4 35.7  MCV 86.8 86.9  PLT 257 232    Cardiac Enzymes: No results for input(s): CKTOTAL, CKMB, CKMBINDEX, TROPONINI in the last 168 hours.  Lipid Panel: No results for input(s): CHOL, TRIG, HDL, CHOLHDL, VLDL, LDLCALC in the last 168 hours.  CBG: No results for input(s): GLUCAP in the last 168 hours.  Microbiology: Results for orders placed or performed during the hospital encounter of 05/15/14  MRSA PCR Screening     Status: None   Collection Time: 05/15/14  5:25 PM  Result Value Ref Range Status   MRSA by PCR NEGATIVE NEGATIVE Final    Comment:        The GeneXpert MRSA Assay (FDA approved for NASAL specimens only), is one component of a comprehensive MRSA colonization surveillance program. It is not intended to diagnose MRSA infection nor to guide or monitor treatment for MRSA infections.     Coagulation Studies: No results for input(s): LABPROT, INR in the last 72 hours.  Imaging: Ct Abdomen Pelvis Wo Contrast  05/10/2015  CLINICAL DATA:  Left  flank pain EXAM: CT ABDOMEN AND PELVIS WITHOUT CONTRAST TECHNIQUE: Multidetector CT imaging of the abdomen and pelvis was performed following the standard protocol without IV contrast. COMPARISON:  CT 11/24/2014 FINDINGS: Lower chest:  Lung bases clear. Hepatobiliary: Unenhanced images of liver are within normal limits. Normal liver size and contour. No liver lesion.  Gallbladder contracted and normal. Bile ducts nondilated Pancreas: Negative Spleen: 2.8 cm calcific cyst in the spleen is unchanged and appears chronic and benign. Spleen is not enlarged. Adrenals/Urinary Tract: 3 mm nonobstructing left upper pole stone is unchanged. No other renal calculi. No mass or obstruction. Urinary bladder distended without focal abnormality. Stomach/Bowel: Negative for bowel obstruction. No bowel edema. Moderate amount of stool throughout the colon. Appendix normal. Negative for diverticulitis or mass. Vascular/Lymphatic: Negative Reproductive: Hysterectomy changes.  No pelvic mass. Other: No free fluid. Musculoskeletal: Negative IMPRESSION: No acute abnormality 3 mm nonobstructing left upper pole renal calculus Chronic calcified cyst in the spleen stable. Electronically Signed   By: Franchot Gallo M.D.   On: 05/10/2015 18:08   Ct Head Wo Contrast  05/10/2015  CLINICAL DATA:  Blow to the head today during a seizure. Initial encounter. EXAM: CT HEAD WITHOUT CONTRAST TECHNIQUE: Contiguous axial images were obtained from the base of the skull through the vertex without intravenous contrast. COMPARISON:  Head CT scan 11/03/2014. FINDINGS: There is no evidence of acute intracranial abnormality including hemorrhage, infarct, mass lesion, mass effect, midline shift or abnormal extra-axial fluid collection. No hydrocephalus or pneumocephalus. The calvarium is intact. Imaged paranasal sinuses and mastoid air cells are clear. IMPRESSION: Negative head CT. Electronically Signed   By: Inge Rise M.D.   On: 05/10/2015 15:25    Medications:  I have reviewed the patient's current medications. Scheduled: . aspirin EC  81 mg Oral Daily  . divalproex  500 mg Oral BID  . enoxaparin (LOVENOX) injection  40 mg Subcutaneous Q24H  . FLUoxetine  20 mg Oral Daily  . gabapentin  300 mg Oral BID  . lacosamide  200 mg Oral BID  . ondansetron (ZOFRAN) IV  4 mg Intravenous Once  . pantoprazole  40 mg  Oral Daily  . topiramate  50 mg Oral Daily    Assessment/Plan: Patient stable today.  Vimpat increased to 200mg  BID.  Tolerating well.    Recommendations: 1. Ophthalmology to evaluate vision. May be done as an outpatient. 2. MRI of the brain may be done as an outpatient 3. Continue all AED's with Vimpat to be continued at 200mg  BID  4. Follow up with neurology as an outpatient   LOS: 2 days   [name], MD Neurology [phone] 05/12/2015  10:20 AM

## 2015-05-12 NOTE — Progress Notes (Addendum)
Pt slept through the night.  Hypotensive this morning.  Had pt start drinking fluids and rechecked.  BP 82/62.  Helped pt up to bathroom and pt voided 550 cc. Dorna Bloom RN

## 2015-05-12 NOTE — Discharge Summary (Signed)
[facility] NAME: [name]    MR#:  [id number]  DATE OF BIRTH:  04/02/1970  DATE OF ADMISSION:  05/10/2015 ADMITTING PHYSICIAN: Vaughan Basta, MD   DATE OF DISCHARGE: 05/12/15  PRIMARY CARE PHYSICIAN: Singh,Jasmine, MD    ADMISSION DIAGNOSIS:  Seizure ([place]) [R56.9]  DISCHARGE DIAGNOSIS:  Pseudoseizures vs Seizures Headache-chronic  SECONDARY DIAGNOSIS:   Past Medical History  Diagnosis Date  . Anxiety   . Arthritis   . Asthma   . Migraines   . Depression   . Emphysema/COPD ([place])   . Sleep apnea   . Stroke ([place])   . Seizure [facility])     HOSPITAL COURSE:  * chronic seizures  Not sure if this is pseudoseizures  received IV vimpat 200 mg ---change to po 200 mg bid Unsure about pt's compliance to meds. No active seizures noted by staff here  CT head is negative. Pt reports she is suppose to go to [facility] for 2nd opinion Spoke with Dr Doy Mince And agrees with above plan.  -MRI of the brain cancelled. Not needed at present. Can be done as outpt if needed. -neurologically stable  * Migraine headache. Some visual disturbance on her right eye.  Possibly this is a presentation of migraine   CT head is negative current  * Abdominal pain left Lower back CT scan of the abdomen is negative.  No complaint of constipation. no indication for IV pain meds.   * History of stroke  Continue aspirin.  * Depression continue fluoxetine. Patient was evaluated by Dr. Weber Cooks. He also thinks that this is not true seizures and could be pseudoseizures.  D/c  Home. Pt agreeable CONSULTS OBTAINED:  Treatment Team:  [name], MD Gonzella Lex, MD  DRUG ALLERGIES:   Allergies  Allergen Reactions  . Bee Venom Anaphylaxis  . Shellfish Allergy Anaphylaxis  . Contrast Media [Iodinated Diagnostic Agents] Other (See Comments)    Reaction:  Unknown   . Penicillins Other (See Comments)     Reaction:  Seizures  Has patient had a PCN reaction causing immediate rash, facial/tongue/throat swelling, SOB or lightheadedness with hypotension: No Has patient had a PCN reaction causing severe rash involving mucus membranes or skin necrosis: No Has patient had a PCN reaction that required hospitalization Yes Has patient had a PCN reaction occurring within the last 10 years: Yes If all of the above answers are "NO", then may proceed with Cephalosporin use.  . Sulfa Antibiotics Itching    DISCHARGE MEDICATIONS:   Current Discharge Medication List    CONTINUE these medications which have CHANGED   Details  lacosamide (VIMPAT) 200 MG TABS tablet Take 1 tablet (200 mg total) by mouth 2 (two) times daily. Qty: 60 tablet, Refills: 0      CONTINUE these medications which have NOT CHANGED   Details  acetaminophen (TYLENOL) 325 MG tablet Take 325-650 mg by mouth every 6 (six) hours as needed for mild pain or headache.    albuterol (PROVENTIL HFA;VENTOLIN HFA) 108 (90 BASE) MCG/ACT inhaler Inhale 1-2 puffs into the lungs every 6 (six) hours as needed for wheezing or shortness of breath.    aspirin EC 81 MG tablet Take 81 mg by mouth daily.    butalbital-aspirin-caffeine (FIORINAL) 50-325-40 MG capsule Take 1 capsule by mouth daily as needed for headache.    divalproex (DEPAKOTE) 250 MG DR tablet Take 500 mg by mouth 2 (two) times daily.    EPINEPHrine (  EPIPEN 2-PAK) 0.3 mg/0.3 mL IJ SOAJ injection Inject 0.3 mg into the muscle once as needed (for severe allergic reaction).    FLUoxetine (PROZAC) 20 MG capsule Take 20 mg by mouth daily.    gabapentin (NEURONTIN) 300 MG capsule Take 300 mg by mouth 2 (two) times daily.    omeprazole (PRILOSEC) 20 MG capsule Take 20 mg by mouth daily.    pantoprazole (PROTONIX) 40 MG tablet Take 40 mg by mouth daily.    promethazine (PHENERGAN) 25 MG tablet Take 25 mg by mouth every 6 (six) hours as needed for nausea or vomiting.    topiramate  (TOPAMAX) 50 MG tablet Take 50 mg by mouth daily.    albuterol (PROVENTIL) (2.5 MG/3ML) 0.083% nebulizer solution Take 2.5 mg by nebulization every 6 (six) hours as needed for wheezing or shortness of breath. Reported on 05/10/2015        If you experience worsening of your admission symptoms, develop shortness of breath, life threatening emergency, suicidal or homicidal thoughts you must seek medical attention immediately by calling 911 or calling your MD immediately  if symptoms less severe.  You Must read complete instructions/literature along with all the possible adverse reactions/side effects for all the Medicines you take and that have been prescribed to you. Take any new Medicines after you have completely understood and accept all the possible adverse reactions/side effects.   Please note  You were cared for by a hospitalist during your hospital stay. If you have any questions about your discharge medications or the care you received while you were in the hospital after you are discharged, you can call the unit and asked to speak with the hospitalist on call if the hospitalist that took care of you is not available. Once you are discharged, your primary care physician will handle any further medical issues. Please note that NO REFILLS for any discharge medications will be authorized once you are discharged, as it is imperative that you return to your primary care physician (or establish a relationship with a primary care physician if you do not have one) for your aftercare needs so that they can reassess your need for medications and monitor your lab values. Today   SUBJECTIVE   Doing well  VITAL SIGNS:  Blood pressure 113/59, pulse 96, temperature 98.5 F (36.9 C), temperature source Oral, resp. rate 16, height 5\' 3"  (1.6 m), weight 71.215 kg (157 lb), SpO2 100 %.  I/O:   Intake/Output Summary (Last 24 hours) at 05/12/15 1029 Last data filed at 05/12/15 0900  Gross per 24 hour   Intake    720 ml  Output   1450 ml  Net   -730 ml    PHYSICAL EXAMINATION:  GENERAL:  45 y.o.-year-old patient lying in the bed with no acute distress.  EYES: Pupils equal, round, reactive to light and accommodation. No scleral icterus. Extraocular muscles intact.  HEENT: Head atraumatic, normocephalic. Oropharynx and nasopharynx clear.  NECK:  Supple, no jugular venous distention. No thyroid enlargement, no tenderness.  LUNGS: Normal breath sounds bilaterally, no wheezing, rales,rhonchi or crepitation. No use of accessory muscles of respiration.  CARDIOVASCULAR: S1, S2 normal. No murmurs, rubs, or gallops.  ABDOMEN: Soft, non-tender, non-distended. Bowel sounds present. No organomegaly or mass.  EXTREMITIES: No pedal edema, cyanosis, or clubbing.  NEUROLOGIC: Cranial nerves II through XII are intact. Muscle strength 5/5 in all extremities. Sensation intact. Gait not checked.  PSYCHIATRIC: The patient is alert and oriented x 3.  SKIN: No obvious  rash, lesion, or ulcer.   DATA REVIEW:   CBC   Recent Labs Lab 05/11/15 0550  WBC 6.1  HGB 12.4  HCT 35.7  PLT 232    Chemistries   Recent Labs Lab 05/10/15 1556 05/11/15 0550  NA 142 142  K 3.7 3.9  CL 111 110  CO2 23 28  GLUCOSE 76 94  BUN 9 10  CREATININE 0.86 0.89  CALCIUM 9.1 8.7*  AST 19  --   ALT 12*  --   ALKPHOS 88  --   BILITOT 0.6  --     Microbiology Results   No results found for this or any previous visit (from the past 240 hour(s)).  RADIOLOGY:  Ct Abdomen Pelvis Wo Contrast  05/10/2015  CLINICAL DATA:  Left flank pain EXAM: CT ABDOMEN AND PELVIS WITHOUT CONTRAST TECHNIQUE: Multidetector CT imaging of the abdomen and pelvis was performed following the standard protocol without IV contrast. COMPARISON:  CT 11/24/2014 FINDINGS: Lower chest:  Lung bases clear. Hepatobiliary: Unenhanced images of liver are within normal limits. Normal liver size and contour. No liver lesion. Gallbladder contracted and  normal. Bile ducts nondilated Pancreas: Negative Spleen: 2.8 cm calcific cyst in the spleen is unchanged and appears chronic and benign. Spleen is not enlarged. Adrenals/Urinary Tract: 3 mm nonobstructing left upper pole stone is unchanged. No other renal calculi. No mass or obstruction. Urinary bladder distended without focal abnormality. Stomach/Bowel: Negative for bowel obstruction. No bowel edema. Moderate amount of stool throughout the colon. Appendix normal. Negative for diverticulitis or mass. Vascular/Lymphatic: Negative Reproductive: Hysterectomy changes.  No pelvic mass. Other: No free fluid. Musculoskeletal: Negative IMPRESSION: No acute abnormality 3 mm nonobstructing left upper pole renal calculus Chronic calcified cyst in the spleen stable. Electronically Signed   By: Franchot Gallo M.D.   On: 05/10/2015 18:08   Ct Head Wo Contrast  05/10/2015  CLINICAL DATA:  Blow to the head today during a seizure. Initial encounter. EXAM: CT HEAD WITHOUT CONTRAST TECHNIQUE: Contiguous axial images were obtained from the base of the skull through the vertex without intravenous contrast. COMPARISON:  Head CT scan 11/03/2014. FINDINGS: There is no evidence of acute intracranial abnormality including hemorrhage, infarct, mass lesion, mass effect, midline shift or abnormal extra-axial fluid collection. No hydrocephalus or pneumocephalus. The calvarium is intact. Imaged paranasal sinuses and mastoid air cells are clear. IMPRESSION: Negative head CT. Electronically Signed   By: Inge Rise M.D.   On: 05/10/2015 15:25     Management plans discussed with the patient, family and they are in agreement.  CODE STATUS:     Code Status Orders        Start     Ordered   05/10/15 2303  Full code   Continuous     05/10/15 2302    Code Status History    Date Active Date Inactive Code Status Order ID Comments User Context   11/03/2014 11:20 PM 11/04/2014  7:18 PM Full Code [id number]  Lytle Butte, MD ED     Advance Directive Documentation        Most Recent Value   Type of Advance Directive  Healthcare Power of [place], Living will   Pre-existing out of facility DNR order (yellow form or pink MOST form)     "MOST" Form in Place?        TOTAL TIME TAKING CARE OF THIS PATIENT: *40* minutes.    Dinia Joynt M.D on 05/12/2015 at 10:29 AM  Between 7am to 6pm -  Pager - [phone] After 6pm go to [email or web address] - password [email or web address] [place] [facility]  Office  [phone]  CC: Primary care physician; Glendon Axe, MD

## 2015-05-12 NOTE — Progress Notes (Addendum)
Pt for discharge home. A/o. No resp distress. No s/s seizure activity.  No c/o pain. Instructions discussed with pt. presc  vimpat given and discussed and other home meds discussed. Diet activity and f/u discussed. Home via w/c at this time w/o c/o.

## 2015-05-18 DIAGNOSIS — R51 Headache: Secondary | ICD-10-CM | POA: Diagnosis not present

## 2015-05-18 DIAGNOSIS — K219 Gastro-esophageal reflux disease without esophagitis: Secondary | ICD-10-CM | POA: Diagnosis not present

## 2015-05-18 DIAGNOSIS — F445 Conversion disorder with seizures or convulsions: Secondary | ICD-10-CM | POA: Diagnosis not present

## 2015-05-18 DIAGNOSIS — F325 Major depressive disorder, single episode, in full remission: Secondary | ICD-10-CM | POA: Diagnosis not present

## 2015-05-18 DIAGNOSIS — H538 Other visual disturbances: Secondary | ICD-10-CM | POA: Insufficient documentation

## 2015-05-18 DIAGNOSIS — N200 Calculus of kidney: Secondary | ICD-10-CM | POA: Insufficient documentation

## 2015-05-18 DIAGNOSIS — J3089 Other allergic rhinitis: Secondary | ICD-10-CM | POA: Diagnosis not present

## 2015-06-01 DIAGNOSIS — H40039 Anatomical narrow angle, unspecified eye: Secondary | ICD-10-CM | POA: Diagnosis not present

## 2015-06-04 DIAGNOSIS — R51 Headache: Secondary | ICD-10-CM

## 2015-06-04 DIAGNOSIS — R519 Headache, unspecified: Secondary | ICD-10-CM | POA: Insufficient documentation

## 2015-06-11 DIAGNOSIS — J3089 Other allergic rhinitis: Secondary | ICD-10-CM | POA: Diagnosis not present

## 2015-06-11 DIAGNOSIS — K219 Gastro-esophageal reflux disease without esophagitis: Secondary | ICD-10-CM | POA: Diagnosis not present

## 2015-06-11 DIAGNOSIS — H538 Other visual disturbances: Secondary | ICD-10-CM | POA: Diagnosis not present

## 2015-06-11 DIAGNOSIS — N200 Calculus of kidney: Secondary | ICD-10-CM | POA: Diagnosis not present

## 2015-06-22 ENCOUNTER — Encounter: Payer: Self-pay | Admitting: *Deleted

## 2015-06-22 ENCOUNTER — Emergency Department: Payer: Commercial Managed Care - HMO

## 2015-06-22 ENCOUNTER — Inpatient Hospital Stay
Admission: EM | Admit: 2015-06-22 | Discharge: 2015-06-27 | DRG: 880 | Disposition: A | Discharge status: Home / Self Care | Payer: Commercial Managed Care - HMO | Attending: Internal Medicine | Admitting: Internal Medicine

## 2015-06-22 DIAGNOSIS — Z8673 Personal history of transient ischemic attack (TIA), and cerebral infarction without residual deficits: Secondary | ICD-10-CM | POA: Diagnosis not present

## 2015-06-22 DIAGNOSIS — G40301 Generalized idiopathic epilepsy and epileptic syndromes, not intractable, with status epilepticus: Secondary | ICD-10-CM | POA: Diagnosis not present

## 2015-06-22 DIAGNOSIS — Z7982 Long term (current) use of aspirin: Secondary | ICD-10-CM

## 2015-06-22 DIAGNOSIS — Z90711 Acquired absence of uterus with remaining cervical stump: Secondary | ICD-10-CM | POA: Diagnosis not present

## 2015-06-22 DIAGNOSIS — Z91041 Radiographic dye allergy status: Secondary | ICD-10-CM | POA: Diagnosis not present

## 2015-06-22 DIAGNOSIS — R4182 Altered mental status, unspecified: Secondary | ICD-10-CM | POA: Diagnosis not present

## 2015-06-22 DIAGNOSIS — Z79899 Other long term (current) drug therapy: Secondary | ICD-10-CM

## 2015-06-22 DIAGNOSIS — G4733 Obstructive sleep apnea (adult) (pediatric): Secondary | ICD-10-CM | POA: Diagnosis present

## 2015-06-22 DIAGNOSIS — Z882 Allergy status to sulfonamides status: Secondary | ICD-10-CM

## 2015-06-22 DIAGNOSIS — F419 Anxiety disorder, unspecified: Secondary | ICD-10-CM | POA: Diagnosis not present

## 2015-06-22 DIAGNOSIS — M1990 Unspecified osteoarthritis, unspecified site: Secondary | ICD-10-CM | POA: Diagnosis not present

## 2015-06-22 DIAGNOSIS — Z9889 Other specified postprocedural states: Secondary | ICD-10-CM

## 2015-06-22 DIAGNOSIS — M542 Cervicalgia: Secondary | ICD-10-CM | POA: Diagnosis not present

## 2015-06-22 DIAGNOSIS — R51 Headache: Secondary | ICD-10-CM | POA: Diagnosis not present

## 2015-06-22 DIAGNOSIS — G8929 Other chronic pain: Secondary | ICD-10-CM | POA: Diagnosis not present

## 2015-06-22 DIAGNOSIS — K219 Gastro-esophageal reflux disease without esophagitis: Secondary | ICD-10-CM | POA: Diagnosis present

## 2015-06-22 DIAGNOSIS — F445 Conversion disorder with seizures or convulsions: Principal | ICD-10-CM | POA: Diagnosis present

## 2015-06-22 DIAGNOSIS — R109 Unspecified abdominal pain: Secondary | ICD-10-CM

## 2015-06-22 DIAGNOSIS — Z801 Family history of malignant neoplasm of trachea, bronchus and lung: Secondary | ICD-10-CM | POA: Diagnosis not present

## 2015-06-22 DIAGNOSIS — R569 Unspecified convulsions: Secondary | ICD-10-CM | POA: Diagnosis not present

## 2015-06-22 DIAGNOSIS — M25512 Pain in left shoulder: Secondary | ICD-10-CM | POA: Diagnosis not present

## 2015-06-22 DIAGNOSIS — J449 Chronic obstructive pulmonary disease, unspecified: Secondary | ICD-10-CM | POA: Diagnosis not present

## 2015-06-22 DIAGNOSIS — F418 Other specified anxiety disorders: Secondary | ICD-10-CM | POA: Diagnosis present

## 2015-06-22 DIAGNOSIS — M25511 Pain in right shoulder: Secondary | ICD-10-CM | POA: Diagnosis not present

## 2015-06-22 DIAGNOSIS — M47812 Spondylosis without myelopathy or radiculopathy, cervical region: Secondary | ICD-10-CM | POA: Diagnosis not present

## 2015-06-22 DIAGNOSIS — F17290 Nicotine dependence, other tobacco product, uncomplicated: Secondary | ICD-10-CM | POA: Diagnosis present

## 2015-06-22 DIAGNOSIS — Z880 Allergy status to penicillin: Secondary | ICD-10-CM | POA: Diagnosis not present

## 2015-06-22 DIAGNOSIS — R519 Headache, unspecified: Secondary | ICD-10-CM

## 2015-06-22 DIAGNOSIS — G40909 Epilepsy, unspecified, not intractable, without status epilepticus: Secondary | ICD-10-CM | POA: Diagnosis not present

## 2015-06-22 DIAGNOSIS — I959 Hypotension, unspecified: Secondary | ICD-10-CM | POA: Diagnosis present

## 2015-06-22 DIAGNOSIS — G40911 Epilepsy, unspecified, intractable, with status epilepticus: Secondary | ICD-10-CM | POA: Diagnosis present

## 2015-06-22 DIAGNOSIS — N200 Calculus of kidney: Secondary | ICD-10-CM | POA: Diagnosis not present

## 2015-06-22 LAB — URINE DRUG SCREEN, QUALITATIVE (ARMC ONLY)
AMPHETAMINES, UR SCREEN: NOT DETECTED
BENZODIAZEPINE, UR SCRN: NOT DETECTED
Barbiturates, Ur Screen: NOT DETECTED
Cannabinoid 50 Ng, Ur ~~LOC~~: NOT DETECTED
Cocaine Metabolite,Ur ~~LOC~~: NOT DETECTED
MDMA (ECSTASY) UR SCREEN: NOT DETECTED
METHADONE SCREEN, URINE: NOT DETECTED
Opiate, Ur Screen: NOT DETECTED
PHENCYCLIDINE (PCP) UR S: NOT DETECTED
TRICYCLIC, UR SCREEN: NOT DETECTED

## 2015-06-22 LAB — URINALYSIS COMPLETE WITH MICROSCOPIC (ARMC ONLY)
Bilirubin Urine: NEGATIVE
GLUCOSE, UA: NEGATIVE mg/dL
Ketones, ur: NEGATIVE mg/dL
Leukocytes, UA: NEGATIVE
NITRITE: NEGATIVE
PROTEIN: NEGATIVE mg/dL
SPECIFIC GRAVITY, URINE: 1.02 (ref 1.005–1.030)
pH: 6 (ref 5.0–8.0)

## 2015-06-22 LAB — BASIC METABOLIC PANEL
Anion gap: 13 (ref 5–15)
BUN: 15 mg/dL (ref 6–20)
CHLORIDE: 109 mmol/L (ref 101–111)
CO2: 18 mmol/L — ABNORMAL LOW (ref 22–32)
Calcium: 9.4 mg/dL (ref 8.9–10.3)
Creatinine, Ser: 0.76 mg/dL (ref 0.44–1.00)
Glucose, Bld: 101 mg/dL — ABNORMAL HIGH (ref 65–99)
POTASSIUM: 3.8 mmol/L (ref 3.5–5.1)
SODIUM: 140 mmol/L (ref 135–145)

## 2015-06-22 LAB — CBC WITH DIFFERENTIAL/PLATELET
BASOS ABS: 0 10*3/uL (ref 0–0.1)
Basophils Relative: 0 %
EOS ABS: 0 10*3/uL (ref 0–0.7)
EOS PCT: 1 %
HCT: 37 % (ref 35.0–47.0)
HEMOGLOBIN: 12.5 g/dL (ref 12.0–16.0)
LYMPHS PCT: 25 %
Lymphs Abs: 1.5 10*3/uL (ref 1.0–3.6)
MCH: 29.5 pg (ref 26.0–34.0)
MCHC: 33.7 g/dL (ref 32.0–36.0)
MCV: 87.4 fL (ref 80.0–100.0)
Monocytes Absolute: 0.7 10*3/uL (ref 0.2–0.9)
Monocytes Relative: 10 %
NEUTROS PCT: 64 %
Neutro Abs: 4 10*3/uL (ref 1.4–6.5)
PLATELETS: 192 10*3/uL (ref 150–440)
RBC: 4.23 MIL/uL (ref 3.80–5.20)
RDW: 12.7 % (ref 11.5–14.5)
WBC: 6.3 10*3/uL (ref 3.6–11.0)

## 2015-06-22 LAB — VALPROIC ACID LEVEL: VALPROIC ACID LVL: 78 ug/mL (ref 50.0–100.0)

## 2015-06-22 LAB — GLUCOSE, CAPILLARY: Glucose-Capillary: 94 mg/dL (ref 65–99)

## 2015-06-22 MED ORDER — TOPIRAMATE 25 MG PO TABS
25.0000 mg | ORAL_TABLET | Freq: Every day | ORAL | Status: DC
  Administered 2015-06-23 – 2015-06-27 (×5): 25 mg via ORAL
  Filled 2015-06-22 (×5): qty 1

## 2015-06-22 MED ORDER — SODIUM CHLORIDE 0.9 % IV SOLN
75.0000 mL/h | INTRAVENOUS | Status: DC
  Administered 2015-06-22 – 2015-06-23 (×2): 75 mL/h via INTRAVENOUS

## 2015-06-22 MED ORDER — ONDANSETRON HCL 4 MG/2ML IJ SOLN
4.0000 mg | Freq: Once | INTRAMUSCULAR | Status: AC
  Administered 2015-06-22: 4 mg via INTRAVENOUS
  Filled 2015-06-22: qty 2

## 2015-06-22 MED ORDER — DIVALPROEX SODIUM 500 MG PO DR TAB
500.0000 mg | DELAYED_RELEASE_TABLET | Freq: Two times a day (BID) | ORAL | Status: DC
  Administered 2015-06-22 – 2015-06-27 (×10): 500 mg via ORAL
  Filled 2015-06-22 (×5): qty 1
  Filled 2015-06-22: qty 2
  Filled 2015-06-22: qty 1
  Filled 2015-06-22: qty 2
  Filled 2015-06-22: qty 1
  Filled 2015-06-22: qty 2

## 2015-06-22 MED ORDER — ASPIRIN EC 81 MG PO TBEC
81.0000 mg | DELAYED_RELEASE_TABLET | Freq: Every day | ORAL | Status: DC
  Administered 2015-06-23 – 2015-06-27 (×5): 81 mg via ORAL
  Filled 2015-06-22 (×5): qty 1

## 2015-06-22 MED ORDER — ALBUTEROL SULFATE (2.5 MG/3ML) 0.083% IN NEBU: 2.5000 mg | INHALATION_SOLUTION | Freq: Four times a day (QID) | RESPIRATORY_TRACT | Status: DC | PRN

## 2015-06-22 MED ORDER — EPINEPHRINE 0.3 MG/0.3ML IJ SOAJ
0.3000 mg | Freq: Once | INTRAMUSCULAR | Status: DC | PRN
  Filled 2015-06-22: qty 0.3

## 2015-06-22 MED ORDER — VALPROATE SODIUM 500 MG/5ML IV SOLN
250.0000 mg | Freq: Once | INTRAVENOUS | Status: AC
  Administered 2015-06-22: 250 mg via INTRAVENOUS
  Filled 2015-06-22: qty 2.5

## 2015-06-22 MED ORDER — ACETAMINOPHEN 500 MG PO TABS
1000.0000 mg | ORAL_TABLET | Freq: Four times a day (QID) | ORAL | Status: DC | PRN
  Administered 2015-06-23 – 2015-06-25 (×4): 1000 mg via ORAL
  Filled 2015-06-22 (×5): qty 2

## 2015-06-22 MED ORDER — TOPIRAMATE 25 MG PO TABS: 50.0000 mg | ORAL_TABLET | Freq: Every day | ORAL | Status: DC

## 2015-06-22 MED ORDER — IBUPROFEN 600 MG PO TABS: 600.0000 mg | ORAL_TABLET | Freq: Once | ORAL | Status: DC

## 2015-06-22 MED ORDER — MORPHINE SULFATE (PF) 2 MG/ML IV SOLN
1.0000 mg | INTRAVENOUS | Status: DC | PRN
Start: 2015-06-22 — End: 2015-06-23
  Administered 2015-06-22: 1 mg via INTRAVENOUS
  Filled 2015-06-22: qty 1

## 2015-06-22 MED ORDER — LACOSAMIDE 50 MG PO TABS
200.0000 mg | ORAL_TABLET | Freq: Two times a day (BID) | ORAL | Status: DC
  Administered 2015-06-22 – 2015-06-27 (×10): 200 mg via ORAL
  Filled 2015-06-22 (×7): qty 4
  Filled 2015-06-22 (×2): qty 1
  Filled 2015-06-22: qty 4

## 2015-06-22 MED ORDER — GABAPENTIN 300 MG PO CAPS
300.0000 mg | ORAL_CAPSULE | Freq: Two times a day (BID) | ORAL | Status: DC
  Administered 2015-06-22 – 2015-06-27 (×10): 300 mg via ORAL
  Filled 2015-06-22 (×10): qty 1

## 2015-06-22 MED ORDER — TOPIRAMATE 25 MG PO TABS: 25.0000 mg | ORAL_TABLET | Freq: Every day | ORAL | Status: DC

## 2015-06-22 MED ORDER — ALBUTEROL SULFATE HFA 108 (90 BASE) MCG/ACT IN AERS: 2.0000 | INHALATION_SPRAY | Freq: Four times a day (QID) | RESPIRATORY_TRACT | Status: DC | PRN

## 2015-06-22 MED ORDER — LORATADINE 10 MG PO TABS
10.0000 mg | ORAL_TABLET | Freq: Every day | ORAL | Status: DC
  Administered 2015-06-23 – 2015-06-27 (×5): 10 mg via ORAL
  Filled 2015-06-22 (×5): qty 1

## 2015-06-22 MED ORDER — LORAZEPAM 2 MG/ML IJ SOLN
INTRAMUSCULAR | Status: AC
Start: 2015-06-22 — End: 2015-06-22
  Filled 2015-06-22: qty 1

## 2015-06-22 MED ORDER — LORAZEPAM 2 MG/ML IJ SOLN
2.0000 mg | Freq: Once | INTRAMUSCULAR | Status: AC
  Administered 2015-06-22: 2 mg via INTRAVENOUS

## 2015-06-22 MED ORDER — TOPIRAMATE 25 MG PO TABS
50.0000 mg | ORAL_TABLET | Freq: Every day | ORAL | Status: DC
  Administered 2015-06-22 – 2015-06-26 (×5): 50 mg via ORAL
  Filled 2015-06-22 (×6): qty 2

## 2015-06-22 MED ORDER — SODIUM CHLORIDE 0.9 % IV BOLUS (SEPSIS)
1000.0000 mL | Freq: Once | INTRAVENOUS | Status: AC
Start: 2015-06-22 — End: 2015-06-22
  Administered 2015-06-22: 1000 mL via INTRAVENOUS

## 2015-06-22 MED ORDER — PANTOPRAZOLE SODIUM 40 MG PO TBEC
40.0000 mg | DELAYED_RELEASE_TABLET | Freq: Every day | ORAL | Status: DC
  Administered 2015-06-23 – 2015-06-27 (×5): 40 mg via ORAL
  Filled 2015-06-22 (×5): qty 1

## 2015-06-22 MED ORDER — FLUOXETINE HCL 20 MG PO CAPS
20.0000 mg | ORAL_CAPSULE | Freq: Every day | ORAL | Status: DC
  Administered 2015-06-23 – 2015-06-27 (×5): 20 mg via ORAL
  Filled 2015-06-22 (×7): qty 1

## 2015-06-22 MED ORDER — ENOXAPARIN SODIUM 40 MG/0.4ML ~~LOC~~ SOLN
40.0000 mg | SUBCUTANEOUS | Status: DC
  Administered 2015-06-22 – 2015-06-26 (×5): 40 mg via SUBCUTANEOUS
  Filled 2015-06-22 (×5): qty 0.4

## 2015-06-22 MED ORDER — ONDANSETRON HCL 4 MG/2ML IJ SOLN
INTRAMUSCULAR | Status: AC
  Filled 2015-06-22: qty 2

## 2015-06-22 MED ORDER — PROMETHAZINE HCL 25 MG PO TABS
25.0000 mg | ORAL_TABLET | Freq: Once | ORAL | Status: DC
  Filled 2015-06-22: qty 1

## 2015-06-22 MED ORDER — LORAZEPAM 2 MG/ML IJ SOLN
1.0000 mg | Freq: Once | INTRAMUSCULAR | Status: AC
  Administered 2015-06-22: 1 mg via INTRAVENOUS

## 2015-06-22 MED ORDER — PROMETHAZINE HCL 25 MG PO TABS
25.0000 mg | ORAL_TABLET | Freq: Four times a day (QID) | ORAL | Status: DC | PRN
  Administered 2015-06-23 – 2015-06-26 (×7): 25 mg via ORAL
  Filled 2015-06-22 (×6): qty 1

## 2015-06-22 MED ORDER — LORAZEPAM 2 MG/ML IJ SOLN
INTRAMUSCULAR | Status: AC
  Filled 2015-06-22: qty 1

## 2015-06-22 MED ORDER — PHENOBARBITAL SODIUM 130 MG/ML IJ SOLN: 25.0000 mg | INTRAMUSCULAR | Status: DC | PRN

## 2015-06-22 MED ORDER — DIPHENHYDRAMINE HCL 50 MG/ML IJ SOLN
25.0000 mg | Freq: Once | INTRAMUSCULAR | Status: AC
  Administered 2015-06-22: 25 mg via INTRAVENOUS
  Filled 2015-06-22: qty 1

## 2015-06-22 MED ORDER — ONDANSETRON HCL 4 MG/2ML IJ SOLN
4.0000 mg | Freq: Once | INTRAMUSCULAR | Status: AC
  Administered 2015-06-22: 4 mg via INTRAVENOUS

## 2015-06-22 MED ORDER — PHENOBARBITAL SODIUM 130 MG/ML IJ SOLN
100.0000 mg | Freq: Every day | INTRAMUSCULAR | Status: DC
  Administered 2015-06-22: 100 mg via INTRAVENOUS
  Filled 2015-06-22 (×2): qty 1

## 2015-06-22 MED ORDER — KETOROLAC TROMETHAMINE 30 MG/ML IJ SOLN
30.0000 mg | Freq: Once | INTRAMUSCULAR | Status: AC
  Administered 2015-06-22: 30 mg via INTRAVENOUS
  Filled 2015-06-22: qty 1

## 2015-06-22 MED ORDER — MIRTAZAPINE 15 MG PO TABS
15.0000 mg | ORAL_TABLET | Freq: Every day | ORAL | Status: DC
  Administered 2015-06-22 – 2015-06-26 (×5): 15 mg via ORAL
  Filled 2015-06-22 (×5): qty 1

## 2015-06-22 NOTE — Progress Notes (Signed)
Pt just had another seizure

## 2015-06-22 NOTE — Progress Notes (Addendum)
Pt had another full body seizure, for about 30 seconds. Patient awoke confused, unable to tell what had happened, speech slurred. This is how the previous seizures occurred. Heart rate elevated at this time during seizure activity.

## 2015-06-22 NOTE — ED Notes (Addendum)
Patient arrived via stretcher from [place] clinic after having a seizure. Patient was minimally responsive, but not seizing upon arrival. Patient was found by husband lying in the hall and patient was holding her head. Staff did not state whether patient hit her head or had any injuries.

## 2015-06-22 NOTE — ED Notes (Signed)
Patient ambulated to and from room commode with steady gait and two stand-by assists.

## 2015-06-22 NOTE — ED Provider Notes (Signed)
[facility] Emergency Department Provider Note   ____________________________________________  Time seen: Upon placement ED room I have reviewed the triage vital signs and the triage nursing note.  HISTORY  Chief Complaint Seizures   Historian Limited history from Patient, postictal Husband provides most of the history  HPI [name] is a 45 y.o. female with a history of frequent seizures, for which she is on Vimpat and Depakote, who typically has one seizure about every 3 days which is reported as generalized, had a seizure at [facility] this afternoon. She reportedly had a routine follow-up appointment to get refills on her medications today, and she it sounds like started having a seizure that may have been witnessed by staff at [facility] clinic. In either case, she is still postictal now, her husband at the bedside stating that this is pretty common for her and her last seizure was a couple days ago. She follows with neurology here [facility], and is being referred to see [facility].  Husband reports that she has had a mild cold recently, but she is getting over that. She's not been otherwise vomiting, diarrhea, abdominal pain, chest pain, headache, confusion altered mental status.  He states that since on Vimpat and Depakote her seizures have actually improved significantly, she is to have multiple seizures per day.  Patient is complaining of nausea and asking for Phenergan, and is complaining of mild left sided headache. She takes Tylenol every morning, about 3 hours ago.    Past Medical History  Diagnosis Date  . Anxiety   . Arthritis   . Asthma   . Migraines   . Depression   . Emphysema/COPD ([place])   . Sleep apnea   . Stroke ([place])   . Seizure [facility])     Patient Active Problem List   Diagnosis Date Noted  . Pseudoseizures 05/11/2015  . Migraines 05/11/2015  . Abdominal pain 05/10/2015  . Seizures ([place]) 05/17/2014  .  Respiratory failure, acute ([place]) 05/16/2014  . COPD (chronic obstructive pulmonary disease) ([place])   . Emphysema/COPD ([place])   . Sleep apnea   . Stroke ([place])   . Seizure [facility])     Past Surgical History  Procedure Laterality Date  . Cesarean section  1993  . Partial hysterectomy  1995  . Abdominal hysterectomy      partial  . Tonsillectomy      Current Outpatient Rx  Name  Route  Sig  Dispense  Refill  . acetaminophen (TYLENOL) 325 MG tablet   Oral   Take 325-650 mg by mouth every 6 (six) hours as needed for mild pain or headache.         . albuterol (PROVENTIL HFA;VENTOLIN HFA) 108 (90 BASE) MCG/ACT inhaler   Inhalation   Inhale 1-2 puffs into the lungs every 6 (six) hours as needed for wheezing or shortness of breath.         Marland Kitchen albuterol (PROVENTIL) (2.5 MG/3ML) 0.083% nebulizer solution   Nebulization   Take 2.5 mg by nebulization every 6 (six) hours as needed for wheezing or shortness of breath. Reported on 05/10/2015         . aspirin EC 81 MG tablet   Oral   Take 81 mg by mouth daily.         . butalbital-aspirin-caffeine (FIORINAL) 50-325-40 MG capsule   Oral   Take 1 capsule by mouth daily as needed for headache.         . divalproex (DEPAKOTE)  250 MG DR tablet   Oral   Take 500 mg by mouth 2 (two) times daily.         Marland Kitchen EPINEPHrine (EPIPEN 2-PAK) 0.3 mg/0.3 mL IJ SOAJ injection   Intramuscular   Inject 0.3 mg into the muscle once as needed (for severe allergic reaction).         Marland Kitchen FLUoxetine (PROZAC) 20 MG capsule   Oral   Take 20 mg by mouth daily.         Marland Kitchen gabapentin (NEURONTIN) 300 MG capsule   Oral   Take 300 mg by mouth 2 (two) times daily.         Marland Kitchen lacosamide (VIMPAT) 200 MG TABS tablet   Oral   Take 1 tablet (200 mg total) by mouth 2 (two) times daily.   60 tablet   0   . omeprazole (PRILOSEC) 20 MG capsule   Oral   Take 20 mg by mouth daily.         . pantoprazole (PROTONIX) 40 MG tablet   Oral   Take 40 mg by mouth  daily.         . promethazine (PHENERGAN) 25 MG tablet   Oral   Take 25 mg by mouth every 6 (six) hours as needed for nausea or vomiting.         . topiramate (TOPAMAX) 50 MG tablet   Oral   Take 50 mg by mouth daily.           Allergies Bee venom; Shellfish allergy; Contrast media; Penicillins; and Sulfa antibiotics  Family History  Problem Relation Age of Onset  . Diabetes Neg Hx   . Lung cancer Mother     Social History Social History  Substance Use Topics  . Smoking status: Current Every Day Smoker    Types: E-cigarettes  . Smokeless tobacco: Never Used  . Alcohol Use: No    Review of Systems Limited due to postictal Constitutional: Negative for fever. Eyes: ENT: Recent URI symptoms Cardiovascular: Respiratory:  Gastrointestinal: Negative for abdominal pain, vomiting and diarrhea. Genitourinary: Musculoskeletal: Skin: Neurological: Positive for headache.  ____________________________________________   PHYSICAL EXAM:  VITAL SIGNS: ED Triage Vitals  Enc Vitals Group     BP 06/22/15 1221 130/84 mmHg     Pulse Rate 06/22/15 1221 87     Resp 06/22/15 1221 18     Temp 06/22/15 1221 98.8 F (37.1 C)     Temp Source 06/22/15 1221 Oral     SpO2 06/22/15 1221 99 %     Weight 06/22/15 1221 154 lb 1.6 oz (69.899 kg)     Height 06/22/15 1221 5\' 2"  (1.575 m)     Head Cir --      Peak Flow --      Pain Score --      Pain Loc --      Pain Edu? --      Excl. in [place]? --      Constitutional: Postictal, confused but alert and answering some questions. In no distress. HEENT   Head: Normocephalic and atraumatic. Patient pointing to the left side of her head, but no contusion or abrasion.      Eyes: Conjunctivae are normal. PERRL. Normal extraocular movements.      Ears:         Nose: No congestion/rhinnorhea.   Mouth/Throat: Mucous membranes are moist.   Neck: No stridor. Nontender. Cardiovascular/Chest: Normal rate, regular rhythm.  No  murmurs, rubs, or gallops.  Respiratory: Normal respiratory effort without tachypnea nor retractions. Breath sounds are clear and equal bilaterally. No wheezes/rales/rhonchi. Gastrointestinal: Soft. No distention, no guarding, no rebound. Nontender.    Genitourinary/rectal:Deferred Musculoskeletal: Nontender with normal range of motion in all extremities. No joint effusions.  No lower extremity tenderness.  No edema. Neurologic:  Appears to be postictal, cooperating someone and will answer some questions.. No gross or focal neurologic deficits are appreciated. Skin:  Skin is warm, dry and intact. No rash noted.   ____________________________________________   EKG I, Lisa Roca, MD, the attending physician have personally viewed and interpreted all ECGs.  None ____________________________________________  LABS (pertinent positives/negatives)  Valproate level LXXVIII Basic minimal panel CO2 18 otherwise without significant abnormalities CBC within normal limits Urinalysis no significant abnormality Urine drug screen negative  ____________________________________________  RADIOLOGY All Xrays were viewed by me. Imaging interpreted by Radiologist.  CT head without contrast: IMPRESSION: Mild frontal atrophy bilaterally near the vertex. Ventricles normal in size and configuration. No intracranial mass, hemorrhage, or focal gray -white compartment lesion. Minimal left frontal paranasal sinus disease. __________________________________________  PROCEDURES  Procedure(s) performed: None  Critical Care performed: CRITICAL CARE Performed by: Lisa Roca   Total critical care time: 75 minutes  Critical care time was exclusive of separately billable procedures and treating other patients.  Critical care was necessary to treat or prevent imminent or life-threatening deterioration.  Critical care was time spent personally by me on the following activities: development of  treatment plan with patient and/or surrogate as well as nursing, discussions with consultants, evaluation of patient's response to treatment, examination of patient, obtaining history from patient or surrogate, ordering and performing treatments and interventions, ordering and review of laboratory studies, ordering and review of radiographic studies, pulse oximetry and re-evaluation of patient's condition.   ____________________________________________   ED COURSE / ASSESSMENT AND PLAN  Pertinent labs & imaging results that were available during my care of the patient were reviewed by me and considered in my medical decision making (see chart for details).   In talking with the husband, it sounds like if this patient had had a seizure at home, he was numbness early brought her into the hospital, because she has a seizure about once every 3 or 4 days. She has not change medications or missed any doses recently. She's had a minor URI, but she is actually improving from that. Today was a follow-up routine appointment for med refills with primary care.  While here in the ED, I was called to the bedside because the nurse noted the patient to start seizing. She did stop her own within about less than 1 minute, and was given 1 mg Ativan as a seizure was extinction on its own.  Based on 2 seizures, I am going to go ahead and send laboratory studies and a urinalysis.  At about 1:30, patient did have another seizure, which was witnessed by me that generalized which lasted less than 2 minutes and was ending as 2 mg Ativan was given IV.  I am concern is the patient's third seizure today, and will discuss with neurology. I am not highly inclined to obtain a head CT given the fact the patient has had a documented head CTs within the past 2 years.  I did discuss this case with Dr. Doy Mince, on call for neurology given history of seizures and pseudoseizures, who appears to have had multiple seizures this  morning. Dr. Doy Mince had seen this patient recently in the hospital for an episode that  seemed to be due to pseudoseizures.  We discussed the issue of the patient potentially having struck her head while unwitnessed, and Dr. Doy Mince agreed that if the patient did strike her head, it is necessary to CT her head to rule out intracranial traumatic injury.  The emergency nurse did speak with the nurse at the [facility] clinic, who confirmed that the patient was unwitnessed when she started seizing and fell backwards. They were alerted by a loud banging, and it appeared that perhaps the patient had struck the wall with her head. In either case, we cannot say for sure that she did not strike her head, and so unfortunately an effort to make sure that today's traumatic episode did not result in intracranial traumatic injury, she needs a CT scan.  I did discuss this with the patient which was having a moment of clarity between seizures.  It's unclear to me if she realizes the concern I have for the amount of radiation exposure she's had due to uncontrolled seizure/pseudoseizure activity resulting in head injuries that then resulted in CT imaging.  Her head CT did found no sign of intracranial traumatic injury or otherwise emergency condition.  I was alerted by the nurse that the patient was having some abnormal neurologic activity, and walked in and saw the patient flapping both of her arms non-rhythmically and I asked the patient was going on and she was able to talk to me. This was certainly more consistent with pseudoseizure activity.  She also states that she has a history of migraines, and so perhaps the neurologic activity is associated with some sort of complicated migraine. I'm going to go ahead and give her Toradol and Benadryl and Zofran for her potential migrainous component.   Dr. Doy Mince did see the patient here in the emergency department, and recommend hospital observation.   CONSULTATIONS:    Phone consultation and then face-to-face with neurologist, Dr. Doy Mince. Face-to-face with hospitalist for admission.   Patient / Family / Caregiver informed of clinical course, medical decision-making process, and agree with plan.    ___________________________________________   FINAL CLINICAL IMPRESSION(S) / ED DIAGNOSES   Final diagnoses:  Seizure ([place])  Pseudoseizure ([place])  Acute nonintractable headache, unspecified headache type              Note: This dictation was prepared with Dragon dictation. Any transcriptional errors that result from this process are unintentional   Lisa Roca, MD 06/22/15 1549

## 2015-06-22 NOTE — ED Notes (Addendum)
Patient's cell phone rang during last seizure. Upon recovering, patient asked for cell phone and was able to state that it was her husband calling earlier because she recognizes the ring tone. Cell phone given to patient.

## 2015-06-22 NOTE — ED Notes (Signed)
Patient is alert and oriented and answering questions and giving history appropriately.

## 2015-06-22 NOTE — ED Notes (Signed)
Dr. Doy Mince at bedside. Patient c/o pain with Benadryl administration and stated that she felt the pain would put her in another seizure. Patient then had another tonic-clonic seizure that was ending when Dr. Doy Mince came into the room.

## 2015-06-22 NOTE — ED Notes (Signed)
Patient involved in full tonic-clonic seizure. Dr. Reita Cliche aware.

## 2015-06-22 NOTE — ED Notes (Signed)
Patient had another tonic-clonic seizure shortly after the previous one. Dr. Reita Cliche aware.

## 2015-06-22 NOTE — Progress Notes (Signed)
[facility] for Drug interaction monitoring on antiepileptic medications Indication: Drug interactions  Allergies  Allergen Reactions  . Bee Venom Anaphylaxis  . Shellfish Allergy Anaphylaxis  . Contrast Media [Iodinated Diagnostic Agents] Other (See Comments)    Reaction:  Unknown   . Penicillins Other (See Comments)    Reaction:  Seizures  Has patient had a PCN reaction causing immediate rash, facial/tongue/throat swelling, SOB or lightheadedness with hypotension: No Has patient had a PCN reaction causing severe rash involving mucus membranes or skin necrosis: No Has patient had a PCN reaction that required hospitalization Yes Has patient had a PCN reaction occurring within the last 10 years: Yes If all of the above answers are "NO", then may proceed with Cephalosporin use.  . Sulfa Antibiotics Itching    Patient Measurements: Height: 5\' 2"  (157.5 cm) Weight: 154 lb (69.854 kg) IBW/kg (Calculated) : 50.1 Adjusted Body Weight:   Vital Signs: Temp: 97.5 F (36.4 C) (04/18 1816) Temp Source: Oral (04/18 1816) BP: 105/70 mmHg (04/18 1816) Pulse Rate: 95 (04/18 1816) Intake/Output from previous day:   Intake/Output from this shift:    Labs:  Recent Labs  06/22/15 1338  WBC 6.3  HGB 12.5  HCT 37.0  PLT 192  CREATININE 0.76   Estimated Creatinine Clearance: 79.6 mL/min (by C-G formula based on Cr of 0.76).   Microbiology: No results found for this or any previous visit (from the past 720 hour(s)).  Medical History: Past Medical History  Diagnosis Date  . Anxiety   . Arthritis   . Asthma   . Migraines   . Depression   . Emphysema/COPD ([place])   . Sleep apnea   . Stroke ([place])   . Seizure ([place])     Medications:  Prescriptions prior to admission  Medication Sig Dispense Refill Last Dose  . acetaminophen (TYLENOL) 500 MG tablet Take 1,000 mg by mouth every 6 (six) hours as needed for mild pain or headache.   PRN at  PRN  . albuterol (PROVENTIL HFA;VENTOLIN HFA) 108 (90 BASE) MCG/ACT inhaler Inhale 2 puffs into the lungs every 6 (six) hours as needed for wheezing or shortness of breath.    PRN at PRN  . albuterol (PROVENTIL) (2.5 MG/3ML) 0.083% nebulizer solution Take 2.5 mg by nebulization every 6 (six) hours as needed for wheezing or shortness of breath. Reported on 05/10/2015   PRN at PRN  . aspirin EC 81 MG tablet Take 81 mg by mouth daily.   unknown at unknown   . divalproex (DEPAKOTE) 500 MG DR tablet Take 500 mg by mouth 2 (two) times daily.   unknown at unknown   . EPINEPHrine (EPIPEN 2-PAK) 0.3 mg/0.3 mL IJ SOAJ injection Inject 0.3 mg into the muscle once as needed (for severe allergic reaction).   PRN at PRN  . FLUoxetine (PROZAC) 20 MG capsule Take 20 mg by mouth daily.   unknown at unknown   . gabapentin (NEURONTIN) 300 MG capsule Take 300 mg by mouth 2 (two) times daily.   unknown at unknown   . lacosamide (VIMPAT) 200 MG TABS tablet Take 1 tablet (200 mg total) by mouth 2 (two) times daily. 60 tablet 0 unknown at unknown   . loratadine (CLARITIN) 10 MG tablet Take 10 mg by mouth daily.   unknown at unknown   . meloxicam (MOBIC) 7.5 MG tablet Take 7.5-15 mg by mouth daily as needed for pain.    PRN at PRN  . mirtazapine (REMERON)  15 MG tablet Take 15 mg by mouth at bedtime.   unknown at unknown   . pantoprazole (PROTONIX) 40 MG tablet Take 40 mg by mouth daily.   unknown at unknown   . promethazine (PHENERGAN) 25 MG tablet Take 25 mg by mouth every 6 (six) hours as needed for nausea or vomiting.   PRN at PRN  . topiramate (TOPAMAX) 50 MG tablet Take 50 mg by mouth daily.   unknown at unknown    Scheduled:  . aspirin EC  81 mg Oral Daily  . divalproex  500 mg Oral BID  . enoxaparin (LOVENOX) injection  40 mg Subcutaneous Q24H  . FLUoxetine  20 mg Oral Daily  . gabapentin  300 mg Oral BID  . ibuprofen  600 mg Oral Once  . lacosamide  200 mg Oral BID  . loratadine  10 mg Oral Daily  .  mirtazapine  15 mg Oral QHS  . pantoprazole  40 mg Oral Daily  . promethazine  25 mg Oral Once  . [START ON 06/23/2015] topiramate  25 mg Oral Daily   And  . topiramate  50 mg Oral QHS  . topiramate  50 mg Oral Daily    Assessment: Pharmacy consulted to assess medication list for any potential drug interactions and monitor antiepileptics  Goal of Therapy:    Plan:  Patient medication list has drug interactions; however medications are PTA medications. Spoke with MD Manuella Ghazi and he is ok with continuing. Follow closely for any changes or any documentation/signs of toxicity or suboptimal effects.    Stevana Dufner D 06/22/2015,6:31 PM

## 2015-06-22 NOTE — ED Notes (Signed)
Patient had a tonic-clonic seizure that lasted approximately 30 seconds. Dr. Reita Cliche aware.Patient became tachy in the 160's, but came down to the 90's when seizure was finished. Patient is now alert.

## 2015-06-22 NOTE — Progress Notes (Signed)
Pt appears to be seizing but calmed down in response to voice and touch on her shoulder.

## 2015-06-22 NOTE — ED Notes (Signed)
Patient having another tonic-clonic seizure. Dr. Reita Cliche aware.

## 2015-06-22 NOTE — Progress Notes (Signed)
Pt has had about 5 seizure since admission to floor - Nursing reported 4 and I went back to reevaluate patient.  She had one while I was on floor - lasted 30 sec the most. Jerking whole body, non responsive to any verbal stimuli and making sounds through mouth.  Eyeballs rolled - no tongue bite, bladder/bowel incontinence.  D/w Neuro Dr Vernon Prey via phone   Imp/Plan:  * Possible status epilepticus - Transfer to ICU - STAT c/s to Intensivist (Notified Dr Emmit Alexanders thru e-link) - Tried [phone] twice (one by me and one through operator) but no response back - gave 250 mg IV Depakot and also if no response to that 100 mg IV Phenobarbital AS NEED - If she continues to have seizures - consider transfer to [facility] per NeuroHospitalist  Critical Care time Spent: 30 mins

## 2015-06-22 NOTE — Progress Notes (Addendum)
Dr. Manuella Ghazi notified patient has had a total of 3 seizures in the last hour. Given all PO seizure medication an hour ago. MD to discuss with Neurologist at this time.

## 2015-06-22 NOTE — Progress Notes (Signed)
Report given to [facility], pt transferred to CCU 4. Patient will call husband and notify change in location.

## 2015-06-22 NOTE — Plan of Care (Signed)
Pt has had 2 seizures in last 30 minutes since she came on to the floor.  Called Dr. Manuella Ghazi to let him know and that she is having severe pain in head, neck, arms and tailbone from fall in outpatient clinic.  Dr. Manuella Ghazi asked me to call neurologist, Dr. Doy Mince regarding seizures. Described the seizures to Dr. Doy Mince - she sd b/c of their short duration, she doesn't think they are true seizures - possibly pseudo seizures or epileptic seizures.  She doesn't want to tx w/ativan.  Told me to call admitting doctor back and have him come look at patient.

## 2015-06-22 NOTE — ED Notes (Signed)
Patient began seizure-like activity again. Dr. Reita Cliche at bedside. Patient looked this Probation officer in the eye upon arrival to the room while having tonic-clonic activity. Dr. Reita Cliche was able to stop activity by talking to the patient. Patient was able to answer questions immediately.

## 2015-06-22 NOTE — Progress Notes (Addendum)
eLink Physician-Brief Progress Note Patient Name: [name] DOB: 1970/10/15 MRN: [id number]   Date of Service  06/22/2015  HPI/Events of Note  44 yo female with PMH of seizure disorder on multiple anticonvulsants. Seized tonight on medical floor. Patient being transferred to ICU and PCCM consult requested. Patient now awake and follows commands. No evidence of further seizures.   eICU Interventions  Dr. Ashby Dawes notified of STAT PCCM consult.      Intervention Category Minor Interventions: Communication with other healthcare providers and/or family  Lysle Dingwall 06/22/2015, 10:01 PM

## 2015-06-22 NOTE — H&P (Signed)
[facility] [place] NAME: [name]    MR#:  [id number]  DATE OF BIRTH:  [birth year]  DATE OF ADMISSION:  06/22/2015  PRIMARY CARE PHYSICIAN: Glendon Axe, MD   REQUESTING/REFERRING PHYSICIAN: Lisa Roca, MD  CHIEF COMPLAINT:   Chief Complaint  Patient presents with  . Seizures    HISTORY OF PRESENT ILLNESS:  [name]  is a 45 y.o. female with a known history of seizures and possible pseudoseizures who was at the OP clinic. Staff heard a loud thud and found the patient on the floor seizing. Patient was brought to the ED where she has had multiple further seizures. Received 5 mg of Ativan. Patient is on 4 different anticonvulsants. Per patient is compliant with medications. Previously was having multiple seizure a day. With addition of Vimpat is having 2-3 per week. Previous monitoring has been suggestive of nonepileptic seizures per records.  All the information is per medical record, patient has been postictal and not able to wake up. PAST MEDICAL HISTORY:   Past Medical History  Diagnosis Date  . Anxiety   . Arthritis   . Asthma   . Migraines   . Depression   . Emphysema/COPD ([place])   . Sleep apnea   . Stroke ([place])   . Seizure ([place])     PAST SURGICAL HISTORY:   Past Surgical History  Procedure Laterality Date  . Cesarean section  1993  . Partial hysterectomy  1995  . Abdominal hysterectomy      partial  . Tonsillectomy      SOCIAL HISTORY:   Social History  Substance Use Topics  . Smoking status: Current Every Day Smoker    Types: E-cigarettes  . Smokeless tobacco: Never Used  . Alcohol Use: No    FAMILY HISTORY:   Family History  Problem Relation Age of Onset  . Diabetes Neg Hx   . Lung cancer Mother     DRUG ALLERGIES:   Allergies  Allergen Reactions  . Bee Venom Anaphylaxis  . Shellfish Allergy Anaphylaxis  . Contrast Media [Iodinated Diagnostic Agents] Other (See  Comments)    Reaction:  Unknown   . Penicillins Other (See Comments)    Reaction:  Seizures  Has patient had a PCN reaction causing immediate rash, facial/tongue/throat swelling, SOB or lightheadedness with hypotension: No Has patient had a PCN reaction causing severe rash involving mucus membranes or skin necrosis: No Has patient had a PCN reaction that required hospitalization Yes Has patient had a PCN reaction occurring within the last 10 years: Yes If all of the above answers are "NO", then may proceed with Cephalosporin use.  . Sulfa Antibiotics Itching    REVIEW OF SYSTEMS:   Review of Systems  Unable to perform ROS: mental acuity    MEDICATIONS AT HOME:   Prior to Admission medications   Medication Sig Start Date End Date Taking? Authorizing Provider  acetaminophen (TYLENOL) 500 MG tablet Take 1,000 mg by mouth every 6 (six) hours as needed for mild pain or headache.   Yes Historical Provider, MD  albuterol (PROVENTIL HFA;VENTOLIN HFA) 108 (90 BASE) MCG/ACT inhaler Inhale 2 puffs into the lungs every 6 (six) hours as needed for wheezing or shortness of breath.    Yes Historical Provider, MD  albuterol (PROVENTIL) (2.5 MG/3ML) 0.083% nebulizer solution Take 2.5 mg by nebulization every 6 (six) hours as needed for wheezing or shortness of breath. Reported on 05/10/2015   Yes Historical Provider, MD  aspirin EC 81 MG tablet Take 81 mg by mouth daily.   Yes Historical Provider, MD  divalproex (DEPAKOTE) 500 MG DR tablet Take 500 mg by mouth 2 (two) times daily.   Yes Historical Provider, MD  EPINEPHrine (EPIPEN 2-PAK) 0.3 mg/0.3 mL IJ SOAJ injection Inject 0.3 mg into the muscle once as needed (for severe allergic reaction).   Yes Historical Provider, MD  FLUoxetine (PROZAC) 20 MG capsule Take 20 mg by mouth daily.   Yes Historical Provider, MD  gabapentin (NEURONTIN) 300 MG capsule Take 300 mg by mouth 2 (two) times daily.   Yes Historical Provider, MD  lacosamide (VIMPAT) 200 MG TABS  tablet Take 1 tablet (200 mg total) by mouth 2 (two) times daily. 05/11/15  Yes Fritzi Mandes, MD  loratadine (CLARITIN) 10 MG tablet Take 10 mg by mouth daily.   Yes Historical Provider, MD  meloxicam (MOBIC) 7.5 MG tablet Take 7.5-15 mg by mouth daily as needed for pain.    Yes Historical Provider, MD  mirtazapine (REMERON) 15 MG tablet Take 15 mg by mouth at bedtime.   Yes Historical Provider, MD  pantoprazole (PROTONIX) 40 MG tablet Take 40 mg by mouth daily.   Yes Historical Provider, MD  promethazine (PHENERGAN) 25 MG tablet Take 25 mg by mouth every 6 (six) hours as needed for nausea or vomiting.   Yes Historical Provider, MD  topiramate (TOPAMAX) 50 MG tablet Take 50 mg by mouth daily.   Yes Historical Provider, MD      VITAL SIGNS:  Blood pressure 93/65, pulse 84, temperature 98.8 F (37.1 C), temperature source Oral, resp. rate 16, height 5\' 2"  (1.575 m), weight 69.899 kg (154 lb 1.6 oz), SpO2 99 %.  PHYSICAL EXAMINATION:  Physical Exam  Constitutional: She is well-developed, well-nourished, and in no distress.  HENT:  Head: Normocephalic and atraumatic.  Eyes: Conjunctivae and EOM are normal. Pupils are equal, round, and reactive to light.  Neck: Normal range of motion. Neck supple. No tracheal deviation present. No thyromegaly present.  Cardiovascular: Normal rate, regular rhythm and normal heart sounds.   Pulmonary/Chest: Effort normal and breath sounds normal. No respiratory distress. She has no wheezes. She exhibits no tenderness.  Abdominal: Soft. Bowel sounds are normal. She exhibits no distension. There is no tenderness.  Musculoskeletal: Normal range of motion.  Neurological: No cranial nerve deficit.  Unable to evaluate as she is in deep sleep/or postictal  Skin: Skin is warm and dry. No rash noted.  Psychiatric:  Unable to evaluate as she is in deep sleep/or postictal   LABORATORY PANEL:   CBC  Recent Labs Lab 06/22/15 1338  WBC 6.3  HGB 12.5  HCT 37.0  PLT  192   ------------------------------------------------------------------------------------------------------------------  Chemistries   Recent Labs Lab 06/22/15 1338  NA 140  K 3.8  CL 109  CO2 18*  GLUCOSE 101*  BUN 15  CREATININE 0.76  CALCIUM 9.4   ------------------------------------------------------------------------------------------------------------------  Cardiac Enzymes No results for input(s): TROPONINI in the last 168 hours. ------------------------------------------------------------------------------------------------------------------  RADIOLOGY:  Ct Head Wo Contrast  06/22/2015  CLINICAL DATA:  Seizure with altered mental status EXAM: CT HEAD WITHOUT CONTRAST TECHNIQUE: Contiguous axial images were obtained from the base of the skull through the vertex without intravenous contrast. COMPARISON:  Head CT May 10, 2015 FINDINGS: The ventricles are normal in size and configuration. There is mild frontal atrophy bilaterally near the vertex. There is no intracranial mass, hemorrhage, extra-axial fluid collection, or midline shift. The gray-white compartments are  normal. No acute infarct evident. The bony calvarium appears intact. The mastoid air cells are clear. No intraorbital lesions are evident. There is a small retention cyst in the posterior inferior left frontal sinus. IMPRESSION: Mild frontal atrophy bilaterally near the vertex. Ventricles normal in size and configuration. No intracranial mass, hemorrhage, or focal gray -white compartment lesion. Minimal left frontal paranasal sinus disease. Electronically Signed   By: Lowella Grip III M.D.   On: 06/22/2015 15:00   IMPRESSION AND PLAN:  45 year old female with a history of seizures/pseudoseizures who is being admitted for breakthrough seizure activity.   * Seizure/pseudoseizure  - Continue Neurontin, Topamax, Vimpat and Depakote.  - Head CT neg for acute changes.  -Neurology recommends limiting further  benzodiazepine use unless seizures become prolonged.  - Seizure precautions - Started Topamax to 25mg  QAM and continue 50mg  QHS per neuro - Checking Depakote level, prolactin, UDS  * Hypotension - Monitor and start IV hydration  * Anxiety/depression - Continue Remeron and Prozac  * COPD - Continue home inhalers    All the records are reviewed and case discussed with ED provider.   CODE STATUS: Full code  TOTAL TIME TAKING CARE OF THIS PATIENT: 45 minutes.    [facility], [name] M.D on 06/22/2015 at 4:37 PM  Between 7am to 6pm - Pager - [phone]  After 6pm go to [email or web address] - password [email or web address] [facility]  Office  [phone]  CC: Primary care physician; Glendon Axe, MD   Note: This dictation was prepared with Dragon dictation along with smaller phrase technology. Any transcriptional errors that result from this process are unintentional.

## 2015-06-22 NOTE — ED Notes (Signed)
Patient able to verify name and birthdate.

## 2015-06-22 NOTE — Progress Notes (Signed)
Pt spouse called out that pt having a seizur, this RN and Mariane Baumgarten RN immediately went into room. Pt staring off to right side, shaking some. Per pt spouse seizure started at 2025 and lasted 30-45 seconds. VSS. Pt now alert but not responding verbally. Primary RN Asencion Partridge notified.

## 2015-06-22 NOTE — Consult Note (Addendum)
Reason for Consult:Seizure  Referring Physician: Reita Cliche  CC: Seizure  HPI: [name] is an 45 y.o. female with a history of seizures and possible pseudoseizures who was at the OP clinic.  Staff heard a loud thud and found the patient on the floor seizing.  Patient was brought to the ED where she has had multiple further seizures.  Received 5mg  of Ativan.  Had another seizure after burn from her IV.  Patient reports that pain with illicit seizures at times.  Patient on 4 anticonvulsants.  Per patient is compliant with medications.  Previously was having multiple seizure a day.  With addition of Vimpat is having 2-3 per week.   Previous monitoring has been suggestive of nonepileptic seizures per husband but not all typical events were captured.    Past Medical History  Diagnosis Date  . Anxiety   . Arthritis   . Asthma   . Migraines   . Depression   . Emphysema/COPD ([place])   . Sleep apnea   . Stroke ([place])   . Seizure [facility])     Past Surgical History  Procedure Laterality Date  . Cesarean section  1993  . Partial hysterectomy  1995  . Abdominal hysterectomy      partial  . Tonsillectomy      Family History  Problem Relation Age of Onset  . Diabetes Neg Hx   . Lung cancer Mother     Social History:  reports that she has been smoking E-cigarettes.  She has never used smokeless tobacco. She reports that she does not drink alcohol or use illicit drugs.  Allergies  Allergen Reactions  . Bee Venom Anaphylaxis  . Shellfish Allergy Anaphylaxis  . Contrast Media [Iodinated Diagnostic Agents] Other (See Comments)    Reaction:  Unknown   . Penicillins Other (See Comments)    Reaction:  Seizures  Has patient had a PCN reaction causing immediate rash, facial/tongue/throat swelling, SOB or lightheadedness with hypotension: No Has patient had a PCN reaction causing severe rash involving mucus membranes or skin necrosis: No Has patient had a PCN reaction that required hospitalization  Yes Has patient had a PCN reaction occurring within the last 10 years: Yes If all of the above answers are "NO", then may proceed with Cephalosporin use.  . Sulfa Antibiotics Itching    Medications: I have reviewed the patient's current medications. Prior to Admission:  Prior to Admission medications   Medication Sig Start Date End Date Taking? Authorizing Provider  acetaminophen (TYLENOL) 325 MG tablet Take 325-650 mg by mouth every 6 (six) hours as needed for mild pain or headache.    Historical Provider, MD  albuterol (PROVENTIL HFA;VENTOLIN HFA) 108 (90 BASE) MCG/ACT inhaler Inhale 1-2 puffs into the lungs every 6 (six) hours as needed for wheezing or shortness of breath.    Historical Provider, MD  albuterol (PROVENTIL) (2.5 MG/3ML) 0.083% nebulizer solution Take 2.5 mg by nebulization every 6 (six) hours as needed for wheezing or shortness of breath. Reported on 05/10/2015    Historical Provider, MD  aspirin EC 81 MG tablet Take 81 mg by mouth daily.    Historical Provider, MD  butalbital-aspirin-caffeine [facility]) 50-325-40 MG capsule Take 1 capsule by mouth daily as needed for headache.    Historical Provider, MD  divalproex (DEPAKOTE) 250 MG DR tablet Take 500 mg by mouth 2 (two) times daily.    Historical Provider, MD  EPINEPHrine (EPIPEN 2-PAK) 0.3 mg/0.3 mL IJ SOAJ injection Inject 0.3 mg into the muscle  once as needed (for severe allergic reaction).    Historical Provider, MD  FLUoxetine (PROZAC) 20 MG capsule Take 20 mg by mouth daily.    Historical Provider, MD  gabapentin (NEURONTIN) 300 MG capsule Take 300 mg by mouth 2 (two) times daily.    Historical Provider, MD  lacosamide (VIMPAT) 200 MG TABS tablet Take 1 tablet (200 mg total) by mouth 2 (two) times daily. 05/11/15   Fritzi Mandes, MD  omeprazole (PRILOSEC) 20 MG capsule Take 20 mg by mouth daily.    Historical Provider, MD  pantoprazole (PROTONIX) 40 MG tablet Take 40 mg by mouth daily.    Historical Provider, MD  promethazine  (PHENERGAN) 25 MG tablet Take 25 mg by mouth every 6 (six) hours as needed for nausea or vomiting.    Historical Provider, MD  topiramate (TOPAMAX) 50 MG tablet Take 50 mg by mouth daily.    Historical Provider, MD    ROS: History obtained from husband  General ROS: negative for - chills, fatigue, fever, night sweats, weight gain or weight loss Psychological ROS: negative for - behavioral disorder, hallucinations, memory difficulties, mood swings or suicidal ideation Ophthalmic ROS: negative for - blurry vision, double vision, eye pain or loss of vision ENT ROS: negative for - epistaxis, nasal discharge, oral lesions, sore throat, tinnitus or vertigo Allergy and Immunology ROS: negative for - hives or itchy/watery eyes Hematological and Lymphatic ROS: negative for - bleeding problems, bruising or swollen lymph nodes Endocrine ROS: negative for - galactorrhea, hair pattern changes, polydipsia/polyuria or temperature intolerance Respiratory ROS: negative for - cough, hemoptysis, shortness of breath or wheezing Cardiovascular ROS: negative for - chest pain, dyspnea on exertion, edema or irregular heartbeat Gastrointestinal ROS: flank pain Genito-Urinary ROS: negative for - dysuria, hematuria, incontinence or urinary frequency/urgency Musculoskeletal ROS: negative for - joint swelling or muscular weakness Neurological ROS: as noted in HPI Dermatological ROS: negative for rash and skin lesion changes  Physical Examination: Blood pressure 108/68, pulse 112, temperature 98.8 F (37.1 C), temperature source Oral, resp. rate 18, height 5\' 2"  (1.575 m), weight 69.899 kg (154 lb 1.6 oz), SpO2 96 %.  HEENT-  Normocephalic, no lesions, without obvious abnormality.  Normal external eye and conjunctiva.  Normal TM's bilaterally.  Normal auditory canals and external ears. Normal external nose, mucus membranes and septum.  Normal pharynx. Cardiovascular- S1, S2 normal, pulses palpable throughout   Lungs-  chest clear, no wheezing, rales, normal symmetric air entry Abdomen- soft, non-tender; bowel sounds normal; no masses,  no organomegaly Extremities- no edema Lymph-no adenopathy palpable Musculoskeletal-no joint tenderness, deformity or swelling Skin-warm and dry, no hyperpigmentation, vitiligo, or suspicious lesions  Neurological Examination Mental Status: Lethargic, oriented, thought content appropriate.  Speech fluent without evidence of aphasia.  Able to follow 3 step commands without difficulty. Cranial Nerves: II: Discs flat bilaterally; Visual fields grossly normal, pupils equal, round, reactive to light and accommodation III,IV, VI: ptosis not present, extra-ocular motions intact bilaterally V,VII: smile symmetric, facial light touch sensation normal bilaterally VIII: hearing normal bilaterally IX,X: gag reflex present XI: bilateral shoulder shrug XII: midline tongue extension Motor: Right : Upper extremity   5/5    Left:     Upper extremity   5/5  Lower extremity   5/5     Lower extremity   5/5 Tone and bulk:normal tone throughout; no atrophy noted Sensory: Pinprick and light touch intact throughout, bilaterally Deep Tendon Reflexes: 2+ and symmetric throughout Plantars: Right: downgoing   Left: downgoing Cerebellar: Normal  finger-to-nose and normal heel-to-shin testing bilaterally Gait: not tested due to safety concerns   Laboratory Studies:   Basic Metabolic Panel:  Recent Labs Lab 06/22/15 1338  NA 140  K 3.8  CL 109  CO2 18*  GLUCOSE 101*  BUN 15  CREATININE 0.76  CALCIUM 9.4    Liver Function Tests: No results for input(s): AST, ALT, ALKPHOS, BILITOT, PROT, ALBUMIN in the last 168 hours. No results for input(s): LIPASE, AMYLASE in the last 168 hours. No results for input(s): AMMONIA in the last 168 hours.  CBC:  Recent Labs Lab 06/22/15 1338  WBC 6.3  NEUTROABS 4.0  HGB 12.5  HCT 37.0  MCV 87.4  PLT 192    Cardiac Enzymes: No results for  input(s): CKTOTAL, CKMB, CKMBINDEX, TROPONINI in the last 168 hours.  BNP: Invalid input(s): POCBNP  CBG: No results for input(s): GLUCAP in the last 168 hours.  Microbiology: Results for orders placed or performed during the hospital encounter of 05/15/14  MRSA PCR Screening     Status: None   Collection Time: 05/15/14  5:25 PM  Result Value Ref Range Status   MRSA by PCR NEGATIVE NEGATIVE Final    Comment:        The GeneXpert MRSA Assay (FDA approved for NASAL specimens only), is one component of a comprehensive MRSA colonization surveillance program. It is not intended to diagnose MRSA infection nor to guide or monitor treatment for MRSA infections.     Coagulation Studies: No results for input(s): LABPROT, INR in the last 72 hours.  Urinalysis:  Recent Labs Lab 06/22/15 1313  COLORURINE YELLOW*  LABSPEC 1.020  PHURINE 6.0  GLUCOSEU NEGATIVE  HGBUR 1+*  BILIRUBINUR NEGATIVE  KETONESUR NEGATIVE  PROTEINUR NEGATIVE  NITRITE NEGATIVE  LEUKOCYTESUR NEGATIVE    Lipid Panel:     Component Value Date/Time   TRIG 194* 05/15/2014 1855    HgbA1C: No results found for: HGBA1C  Urine Drug Screen:     Component Value Date/Time   LABOPIA NONE DETECTED 06/22/2015 1313   LABOPIA NONE DETECTED 05/15/2014 1724   COCAINSCRNUR NONE DETECTED 06/22/2015 1313   COCAINSCRNUR NONE DETECTED 05/15/2014 1724   LABBENZ NONE DETECTED 06/22/2015 1313   LABBENZ POSITIVE* 05/15/2014 1724   AMPHETMU NONE DETECTED 06/22/2015 1313   AMPHETMU NONE DETECTED 05/15/2014 1724   THCU NONE DETECTED 06/22/2015 1313   THCU NONE DETECTED 05/15/2014 1724   LABBARB NONE DETECTED 06/22/2015 1313   LABBARB NONE DETECTED 05/15/2014 1724    Alcohol Level: No results for input(s): ETH in the last 168 hours.   Imaging: Ct Head Wo Contrast  06/22/2015  CLINICAL DATA:  Seizure with altered mental status EXAM: CT HEAD WITHOUT CONTRAST TECHNIQUE: Contiguous axial images were obtained from the  base of the skull through the vertex without intravenous contrast. COMPARISON:  Head CT May 10, 2015 FINDINGS: The ventricles are normal in size and configuration. There is mild frontal atrophy bilaterally near the vertex. There is no intracranial mass, hemorrhage, extra-axial fluid collection, or midline shift. The gray-white compartments are normal. No acute infarct evident. The bony calvarium appears intact. The mastoid air cells are clear. No intraorbital lesions are evident. There is a small retention cyst in the posterior inferior left frontal sinus. IMPRESSION: Mild frontal atrophy bilaterally near the vertex. Ventricles normal in size and configuration. No intracranial mass, hemorrhage, or focal gray -white compartment lesion. Minimal left frontal paranasal sinus disease. Electronically Signed   By: Lowella Grip III M.D.  On: 06/22/2015 15:00     Assessment/Plan: 45 year old female with a history of seizures/pseudoseizures who presents with breakthrough seizure activity.  There have been some clear events of what appear to be pseudoseizure in the ED and other events that are less clear per observers.  Patient on Neurontin, Topamax, Vimpat and Depakote.   Head CT personally reviewed and shows no acute changes.    Recommendations: 1.  Would limit further benzodiazepine use unless seizures become prolonged.   2.  Seizure precautions 3.  Increase Topamax to 25mg  QAM and continue 50mg  Qhs 4.  Continue Neurontin, Vimpat and Depakote at current doses 5.  Depakote level, prolactin, UDS  [name], MD Neurology [phone] 06/22/2015, 3:16 PM

## 2015-06-22 NOTE — Consult Note (Addendum)
[facility] Consultation      Assessment and Plan:  Seizures, intractable. -Continue management per neurology recommendations.  --The patient was given a PO dose of phenobarb. I have written for IV phenobarbital when necessary for breakthrough seizures.   Altered mental status. -The patient is currently altered, presumably postictal state, she was apparently lucid just before the seizure. We'll keep the patient nothing by mouth for now. -Currently. I did not see an indication for intubation as the patient's respiratory status appears to be stable and she appears to be protecting her airway.  Obstructive sleep apnea. -This is per the chart, I do not see any evidence of a previous sleep study.  Critical care service will be available if there are any further issues.   Date: 06/22/2015  MRN# [id number] [name] 1970-10-18  Referring Physician:   Vantasia Signs is a 45 y.o. old female seen in consultation for chief complaint of:    Chief Complaint  Patient presents with  . Seizures    HPI:   The patient is a 45 year old female with a history of seizures, and possible pseudoseizures. The patient was admitted today from the outpatient clinic after she had a witnessed seizure. She currently is lethargic and therefore cannot provide a history. The patient is lethargic, she appears to be breathing comfortably, she does not appear to be in any respiratory distress, her oxygen saturation is normal on room air. The patient has no audible gurgling to suggest impending respiratory failure or loss of airway control.  After admission to the general medical floor, the patient had approximately 5 seizures, one witnessed by the hospitalist physician. The neurology services, following and recommended changing her medications, including Depakote, IV and phenobarbital. Patient was subsequently transferred to the intensive care unit for closer monitoring. Critical care RN  notes that the patient was awake and conversant. Upon arrival to the intensive care unit, but had another seizure lasting less than 1 minute at about 10:50PM, just prior to my evaluation of her.  Addendum 11:00pm; the patient has become lucid, she remains lethargic but is A and O times 3.    PMHX:   Past Medical History  Diagnosis Date  . Anxiety   . Arthritis   . Asthma   . Migraines   . Depression   . Emphysema/COPD ([place])   . Sleep apnea   . Stroke ([place])   . Seizure [facility])    Surgical Hx:  Past Surgical History  Procedure Laterality Date  . Cesarean section  1993  . Partial hysterectomy  1995  . Abdominal hysterectomy      partial  . Tonsillectomy     Family Hx:  Family History  Problem Relation Age of Onset  . Diabetes Neg Hx   . Lung cancer Mother    Social Hx:   Social History  Substance Use Topics  . Smoking status: Current Every Day Smoker    Types: E-cigarettes  . Smokeless tobacco: Never Used  . Alcohol Use: No   Medication:   No current outpatient prescriptions on file.    Allergies:  Bee venom; Shellfish allergy; Contrast media; Penicillins; and Sulfa antibiotics  Review of Systems: Pt can not provide review or systems due to lethargy.   Physical Examination:   VS: BP 107/57 mmHg  Pulse 110  Temp(Src) 98.4 F (36.9 C) (Oral)  Resp 18  Ht 5\' 2"  (1.575 m)  Wt 154 lb (69.854 kg)  BMI 28.16 kg/m2  SpO2 99%  LMP  (  LMP Unknown)  General Appearance: No distress  Neuro:without focal findings,  speech normal,  HEENT: PERRLA, EOM intact.   Pulmonary: normal breath sounds, No wheezing.  CardiovascularNormal S1,S2.  No m/r/g.   Abdomen: Benign, Soft, non-tender. Renal:  No costovertebral tenderness  GU:  No performed at this time. Endoc: No evident thyromegaly, no signs of acromegaly. Skin:   warm, no rashes, no ecchymosis  Extremities: normal, no cyanosis, clubbing.  Other findings:    LABORATORY PANEL:   CBC  Recent Labs Lab  06/22/15 1338  WBC 6.3  HGB 12.5  HCT 37.0  PLT 192   ------------------------------------------------------------------------------------------------------------------  Chemistries   Recent Labs Lab 06/22/15 1338  NA 140  K 3.8  CL 109  CO2 18*  GLUCOSE 101*  BUN 15  CREATININE 0.76  CALCIUM 9.4   ------------------------------------------------------------------------------------------------------------------  Cardiac Enzymes No results for input(s): TROPONINI in the last 168 hours. ------------------------------------------------------------  RADIOLOGY:  Ct Head Wo Contrast  06/22/2015  CLINICAL DATA:  Seizure with altered mental status EXAM: CT HEAD WITHOUT CONTRAST TECHNIQUE: Contiguous axial images were obtained from the base of the skull through the vertex without intravenous contrast. COMPARISON:  Head CT May 10, 2015 FINDINGS: The ventricles are normal in size and configuration. There is mild frontal atrophy bilaterally near the vertex. There is no intracranial mass, hemorrhage, extra-axial fluid collection, or midline shift. The gray-white compartments are normal. No acute infarct evident. The bony calvarium appears intact. The mastoid air cells are clear. No intraorbital lesions are evident. There is a small retention cyst in the posterior inferior left frontal sinus. IMPRESSION: Mild frontal atrophy bilaterally near the vertex. Ventricles normal in size and configuration. No intracranial mass, hemorrhage, or focal gray -white compartment lesion. Minimal left frontal paranasal sinus disease. Electronically Signed   By: Lowella Grip III M.D.   On: 06/22/2015 15:00       Thank  you for the consultation and for allowing [facility] to assist in the care of your patient. Our recommendations are noted above.  Please contact us if we can be of further service.   Marda Stalker, MD.  Board Certified in Internal Medicine, Pulmonary  Medicine, [place], and Sleep Medicine.  [facility] Office Number: [phone]  Patricia Pesa, M.D.  Vilinda Boehringer, M.D.  Merton Border, M.D  06/22/2015   Critical Care Attestation.  I have personally obtained a history, examined the patient, evaluated laboratory and imaging results, formulated the assessment and plan and placed orders. The Patient requires high complexity decision making for assessment and support, frequent evaluation and titration of therapies, application of advanced monitoring technologies and extensive interpretation of multiple databases. The patient has critical illness that could lead imminently to failure of 1 or more organ systems and requires the highest level of physician preparedness to intervene.  Critical Care Time devoted to patient care services described in this note is 35 minutes and is exclusive of time spent in procedures.

## 2015-06-22 NOTE — ED Notes (Signed)
[name], office manager at [facility], called and verified statements of CMA [name], [name], [name] that they heard a loud thump and turned around and patient was seizing on the ground.

## 2015-06-23 DIAGNOSIS — F445 Conversion disorder with seizures or convulsions: Secondary | ICD-10-CM | POA: Insufficient documentation

## 2015-06-23 DIAGNOSIS — G40301 Generalized idiopathic epilepsy and epileptic syndromes, not intractable, with status epilepticus: Secondary | ICD-10-CM

## 2015-06-23 DIAGNOSIS — R569 Unspecified convulsions: Secondary | ICD-10-CM

## 2015-06-23 LAB — PROLACTIN: PROLACTIN: 21.6 ng/mL (ref 4.8–23.3)

## 2015-06-23 LAB — VALPROIC ACID LEVEL: VALPROIC ACID LVL: 63 ug/mL (ref 50.0–100.0)

## 2015-06-23 LAB — MRSA PCR SCREENING: MRSA by PCR: NEGATIVE

## 2015-06-23 NOTE — Progress Notes (Signed)
Patient had another episode of what appeared to be a seizure. Heart rate 130's, foaming at the mouth, patient bilateral arms extended, lasted about 30 seconds and patient was able to be re-oriented.

## 2015-06-23 NOTE — Progress Notes (Signed)
Subjective: Patient has continued to have multiple seizure-like events overnight and today.  Last was about 10 minutes prior to my evaluation.  Patient awake and alert at this time.  Given an additional 250mg  of Depacon overnight.    Objective: Current vital signs: BP 116/85 mmHg  Pulse 100  Temp(Src) 98.6 F (37 C) (Oral)  Resp 9  Ht 5\' 2"  (1.575 m)  Wt 69.854 kg (154 lb)  BMI 28.16 kg/m2  SpO2 99%  LMP  (LMP Unknown) Vital signs in last 24 hours: Temp:  [97.5 F (36.4 C)-98.6 F (37 C)] 98.6 F (37 C) (04/19 0700) Pulse Rate:  [77-121] 100 (04/19 1300) Resp:  [9-26] 9 (04/19 1300) BP: (92-131)/(57-97) 116/85 mmHg (04/19 1300) SpO2:  [95 %-100 %] 99 % (04/19 1300) Weight:  [69.854 kg (154 lb)] 69.854 kg (154 lb) (04/18 1800)  Intake/Output from previous day: 04/18 0701 - 04/19 0700 In: -  Out: 400 [Urine:400] Intake/Output this shift:   Nutritional status: Diet regular Room service appropriate?: Yes; Fluid consistency:: Thin  Neurologic Exam: Mental Status: Awake, alert. Speech fluent without evidence of aphasia. Able to follow 3 step commands without difficulty. Cranial Nerves: II: Discs flat bilaterally; Visual fields grossly normal, pupils equal, round, reactive to light and accommodation III,IV, VI: ptosis not present, extra-ocular motions intact bilaterally V,VII: smile symmetric, facial light touch sensation normal bilaterally VIII: hearing normal bilaterally IX,X: gag reflex present XI: bilateral shoulder shrug XII: midline tongue extension Motor: Right :Upper extremity 5/5Left: Upper extremity 5/5 Lower extremity 5/5Lower extremity 5/5   Lab Results: Basic Metabolic Panel:  Recent Labs Lab 06/22/15 1338  NA 140  K 3.8  CL 109  CO2 18*  GLUCOSE 101*  BUN 15  CREATININE 0.76  CALCIUM 9.4    Liver Function Tests: No results for  input(s): AST, ALT, ALKPHOS, BILITOT, PROT, ALBUMIN in the last 168 hours. No results for input(s): LIPASE, AMYLASE in the last 168 hours. No results for input(s): AMMONIA in the last 168 hours.  CBC:  Recent Labs Lab 06/22/15 1338  WBC 6.3  NEUTROABS 4.0  HGB 12.5  HCT 37.0  MCV 87.4  PLT 192    Cardiac Enzymes: No results for input(s): CKTOTAL, CKMB, CKMBINDEX, TROPONINI in the last 168 hours.  Lipid Panel: No results for input(s): CHOL, TRIG, HDL, CHOLHDL, VLDL, LDLCALC in the last 168 hours.  CBG:  Recent Labs Lab 06/22/15 2211  GLUCAP 94    Microbiology: Results for orders placed or performed during the hospital encounter of 06/22/15  MRSA PCR Screening     Status: None   Collection Time: 06/22/15 11:04 PM  Result Value Ref Range Status   MRSA by PCR NEGATIVE NEGATIVE Final    Comment:        The GeneXpert MRSA Assay (FDA approved for NASAL specimens only), is one component of a comprehensive MRSA colonization surveillance program. It is not intended to diagnose MRSA infection nor to guide or monitor treatment for MRSA infections.     Coagulation Studies: No results for input(s): LABPROT, INR in the last 72 hours.  Imaging: Ct Head Wo Contrast  06/22/2015  CLINICAL DATA:  Seizure with altered mental status EXAM: CT HEAD WITHOUT CONTRAST TECHNIQUE: Contiguous axial images were obtained from the base of the skull through the vertex without intravenous contrast. COMPARISON:  Head CT May 10, 2015 FINDINGS: The ventricles are normal in size and configuration. There is mild frontal atrophy bilaterally near the vertex. There is no intracranial mass, hemorrhage,  extra-axial fluid collection, or midline shift. The gray-white compartments are normal. No acute infarct evident. The bony calvarium appears intact. The mastoid air cells are clear. No intraorbital lesions are evident. There is a small retention cyst in the posterior inferior left frontal sinus.  IMPRESSION: Mild frontal atrophy bilaterally near the vertex. Ventricles normal in size and configuration. No intracranial mass, hemorrhage, or focal gray -white compartment lesion. Minimal left frontal paranasal sinus disease. Electronically Signed   By: Lowella Grip III M.D.   On: 06/22/2015 15:00    Medications:  I have reviewed the patient's current medications. Scheduled: . aspirin EC  81 mg Oral Daily  . divalproex  500 mg Oral BID  . enoxaparin (LOVENOX) injection  40 mg Subcutaneous Q24H  . FLUoxetine  20 mg Oral Daily  . gabapentin  300 mg Oral BID  . ibuprofen  600 mg Oral Once  . lacosamide  200 mg Oral BID  . loratadine  10 mg Oral Daily  . mirtazapine  15 mg Oral QHS  . pantoprazole  40 mg Oral Daily  . promethazine  25 mg Oral Once  . topiramate  25 mg Oral Daily   And  . topiramate  50 mg Oral QHS    Assessment/Plan: Concern still remains for pseudoseizure.  Topamax increased to 25mg  in AM and 50mg  qhs.  Patient not exhibiting post-ictal phenomenon at this time.  Despite multiple generalized events prolactin is not elevated.  Patient is followed on an outpatient basis.    Recommendations: 1.  Hold pain medications 2.  Continue seizure precautions 3.  Continue anticonvulsant therapy at current doses.     LOS: 1 day   [name], MD Neurology [phone] 06/23/2015  1:47 PM

## 2015-06-23 NOTE — Progress Notes (Signed)
PULMONARY / CRITICAL CARE MEDICINE   Name: [name] MRN: [id number] DOB: November 23, 1970    ADMISSION DATE:  06/22/2015  BRIEF HISTORY: 45 y.o. female with a history of seizures and possible pseudoseizures who was at the OP clinic. Staff heard a loud thud and found the patient on the floor seizing. Patient was brought to the ED where she has had multiple further seizures. Received 5mg  of Ativan. Had another seizure after burn from her IV. Patient reports that pain with illicit seizures at times. Patient on 4 anticonvulsants. Per patient is compliant with medications. Previously was having multiple seizure a day. With addition of Vimpat is having 2-3 per week.   SUBJECTIVE:  Mediastinum another episode of "seizure", hand and feet shaking overnight, today around noon also a similar episode, broke spontaneously after 60 seconds. On 2 L nasal cannula saturating 100%.   VITAL SIGNS: Temp:  [97.5 F (36.4 C)-98.6 F (37 C)] 98.6 F (37 C) (04/19 0700) Pulse Rate:  [77-121] 100 (04/19 1300) Resp:  [9-26] 9 (04/19 1300) BP: (92-131)/(57-97) 116/85 mmHg (04/19 1300) SpO2:  [95 %-100 %] 99 % (04/19 1300) Weight:  [154 lb (69.854 kg)] 154 lb (69.854 kg) (04/18 1800) HEMODYNAMICS:   VENTILATOR SETTINGS:   INTAKE / OUTPUT:  Intake/Output Summary (Last 24 hours) at 06/23/15 1328 Last data filed at 06/22/15 2309  Gross per 24 hour  Intake      0 ml  Output    400 ml  Net   -400 ml    Review of Systems  Constitutional: Negative for fever and chills.  Eyes: Negative for blurred vision and double vision.  Respiratory: Negative for cough.   Cardiovascular: Negative for chest pain.  Gastrointestinal: Negative for heartburn and nausea.  Genitourinary: Negative for dysuria.  Musculoskeletal: Negative for myalgias.  Skin: Negative for rash.  Neurological: Positive for seizures. Negative for dizziness, loss of consciousness and headaches.    Physical Exam  Constitutional: She  is well-developed, well-nourished, and in no distress.  HENT:  Head: Normocephalic and atraumatic.  Right Ear: External ear normal.  Left Ear: External ear normal.  Nose: Nose normal.  Mouth/Throat: Oropharynx is clear and moist.  Eyes: Pupils are equal, round, and reactive to light.  Neck: Normal range of motion. Neck supple.  Cardiovascular: Normal rate, regular rhythm and normal heart sounds.   Pulmonary/Chest: Effort normal and breath sounds normal. No respiratory distress. She has no wheezes.  Abdominal: Soft. Bowel sounds are normal.  Musculoskeletal: Normal range of motion.  Neurological: She is alert.  Skin: Skin is warm and dry.     LABS:  CBC  Recent Labs Lab 06/22/15 1338  WBC 6.3  HGB 12.5  HCT 37.0  PLT 192   Coag's No results for input(s): APTT, INR in the last 168 hours. BMET  Recent Labs Lab 06/22/15 1338  NA 140  K 3.8  CL 109  CO2 18*  BUN 15  CREATININE 0.76  GLUCOSE 101*   Electrolytes  Recent Labs Lab 06/22/15 1338  CALCIUM 9.4   Sepsis Markers No results for input(s): LATICACIDVEN, PROCALCITON, O2SATVEN in the last 168 hours. ABG No results for input(s): PHART, PCO2ART, PO2ART in the last 168 hours. Liver Enzymes No results for input(s): AST, ALT, ALKPHOS, BILITOT, ALBUMIN in the last 168 hours. Cardiac Enzymes No results for input(s): TROPONINI, PROBNP in the last 168 hours. Glucose  Recent Labs Lab 06/22/15 2211  GLUCAP 94    Imaging Ct Head Wo Contrast  06/22/2015  CLINICAL DATA:  Seizure with altered mental status EXAM: CT HEAD WITHOUT CONTRAST TECHNIQUE: Contiguous axial images were obtained from the base of the skull through the vertex without intravenous contrast. COMPARISON:  Head CT May 10, 2015 FINDINGS: The ventricles are normal in size and configuration. There is mild frontal atrophy bilaterally near the vertex. There is no intracranial mass, hemorrhage, extra-axial fluid collection, or midline shift. The  gray-white compartments are normal. No acute infarct evident. The bony calvarium appears intact. The mastoid air cells are clear. No intraorbital lesions are evident. There is a small retention cyst in the posterior inferior left frontal sinus. IMPRESSION: Mild frontal atrophy bilaterally near the vertex. Ventricles normal in size and configuration. No intracranial mass, hemorrhage, or focal gray -white compartment lesion. Minimal left frontal paranasal sinus disease. Electronically Signed   By: Lowella Grip III M.D.   On: 06/22/2015 15:00    LINES:   CULTURES:   ANTIBIOTICS  ASSESSMENT / PLAN:  Seizures, intractable. -Continue management per neurology recommendations.  --AEDs by neurology  -- possible pseudoseizures, psych eval pending   Altered mental status. - resolving - awake this morning. -Currently. I do see an immediate need for intubation as the patient's respiratory status appears to be stable and she appears to be protecting her airway.  Obstructive sleep apnea. -This is per the chart, I do not see any evidence of a previous sleep study.  Thank you for consulting [facility], Please feel free to contacts [place] with any questions at [phone] (please enter 7-digits).  I have personally obtained a history, examined the patient, evaluated laboratory and imaging results, formulated the assessment and plan and placed orders.  Pulmonary Care Time devoted to patient care services described in this note is 35 minutes.   Vilinda Boehringer, MD [facility] Pager [phone] (please enter 7-digits) On Call Pager [phone] (please enter 7-digits)  Note: This note was prepared with Dragon dictation along with smaller phrase technology. Any transcriptional errors that result from this process are unintentional.

## 2015-06-23 NOTE — Progress Notes (Signed)
Pt appears to have no postictal state and is able to accurately answer questions and is completely oriented immediately following seizure.

## 2015-06-23 NOTE — Progress Notes (Signed)
Patient just had another episode of a pseudoseizure. Heart rate in the 130's. Was able to re-orient patient. Called CCMD as I did not get a phone call regarding patients monitoring changes.

## 2015-06-23 NOTE — Progress Notes (Signed)
Pt had "seizure" , calmed to voice.

## 2015-06-23 NOTE — Progress Notes (Signed)
Patient ID: [name], female   DOB: [birth year], 45 y.o.   MRN: [id number] [facility] PROGRESS NOTE  [name] [id number] DOB: 10-13-1970 DOA: 06/22/2015 PCP: Glendon Axe, MD  HPI/Subjective: Patient does not recall any of the seizure activity that she had yesterday.  As per nurse had another episode at around 12 noon.  Objective: Filed Vitals:   06/23/15 1400 06/23/15 1500  BP: 98/67 96/64  Pulse: 90 81  Temp:    Resp: 15 16    Filed Weights   06/22/15 1221 06/22/15 1800  Weight: 69.899 kg (154 lb 1.6 oz) 69.854 kg (154 lb)    ROS: Review of Systems  Constitutional: Negative for fever and chills.  Eyes: Negative for blurred vision.  Respiratory: Negative for cough and shortness of breath.   Cardiovascular: Negative for chest pain.  Gastrointestinal: Negative for nausea, vomiting, diarrhea and constipation.  Genitourinary: Negative for dysuria.  Musculoskeletal: Negative for joint pain.  Neurological: Positive for seizures. Negative for dizziness and headaches.   Exam: Physical Exam  Constitutional: She is oriented to person, place, and time.  HENT:  Nose: No mucosal edema.  Mouth/Throat: No oropharyngeal exudate or posterior oropharyngeal edema.  Eyes: Conjunctivae, EOM and lids are normal. Pupils are equal, round, and reactive to light.  Neck: No JVD present. Carotid bruit is not present. No edema present. No thyroid mass and no thyromegaly present.  Cardiovascular: S1 normal and S2 normal.  Exam reveals no gallop.   No murmur heard. Pulses:      Dorsalis pedis pulses are 2+ on the right side, and 2+ on the left side.  Respiratory: No respiratory distress. She has no wheezes. She has no rhonchi. She has no rales.  GI: Soft. Bowel sounds are normal. There is no tenderness.  Musculoskeletal:       Right ankle: She exhibits no swelling.       Left ankle: She exhibits no swelling.  Lymphadenopathy:    She has no cervical adenopathy.   Neurological: She is alert and oriented to person, place, and time. No cranial nerve deficit.  Skin: Skin is warm. No rash noted. Nails show no clubbing.  Psychiatric: She has a normal mood and affect.      Data Reviewed: Basic Metabolic Panel:  Recent Labs Lab 06/22/15 1338  NA 140  K 3.8  CL 109  CO2 18*  GLUCOSE 101*  BUN 15  CREATININE 0.76  CALCIUM 9.4   CBC:  Recent Labs Lab 06/22/15 1338  WBC 6.3  NEUTROABS 4.0  HGB 12.5  HCT 37.0  MCV 87.4  PLT 192    CBG:  Recent Labs Lab 06/22/15 2211  GLUCAP 94    Recent Results (from the past 240 hour(s))  MRSA PCR Screening     Status: None   Collection Time: 06/22/15 11:04 PM  Result Value Ref Range Status   MRSA by PCR NEGATIVE NEGATIVE Final    Comment:        The GeneXpert MRSA Assay (FDA approved for NASAL specimens only), is one component of a comprehensive MRSA colonization surveillance program. It is not intended to diagnose MRSA infection nor to guide or monitor treatment for MRSA infections.      Studies: Ct Head Wo Contrast  06/22/2015  CLINICAL DATA:  Seizure with altered mental status EXAM: CT HEAD WITHOUT CONTRAST TECHNIQUE: Contiguous axial images were obtained from the base of the skull through the vertex without intravenous contrast. COMPARISON:  Head CT May 10, 2015 FINDINGS: The  ventricles are normal in size and configuration. There is mild frontal atrophy bilaterally near the vertex. There is no intracranial mass, hemorrhage, extra-axial fluid collection, or midline shift. The gray-white compartments are normal. No acute infarct evident. The bony calvarium appears intact. The mastoid air cells are clear. No intraorbital lesions are evident. There is a small retention cyst in the posterior inferior left frontal sinus. IMPRESSION: Mild frontal atrophy bilaterally near the vertex. Ventricles normal in size and configuration. No intracranial mass, hemorrhage, or focal gray -white  compartment lesion. Minimal left frontal paranasal sinus disease. Electronically Signed   By: Lowella Grip III M.D.   On: 06/22/2015 15:00    Scheduled Meds: . aspirin EC  81 mg Oral Daily  . divalproex  500 mg Oral BID  . enoxaparin (LOVENOX) injection  40 mg Subcutaneous Q24H  . FLUoxetine  20 mg Oral Daily  . gabapentin  300 mg Oral BID  . ibuprofen  600 mg Oral Once  . lacosamide  200 mg Oral BID  . loratadine  10 mg Oral Daily  . mirtazapine  15 mg Oral QHS  . pantoprazole  40 mg Oral Daily  . promethazine  25 mg Oral Once  . topiramate  25 mg Oral Daily   And  . topiramate  50 mg Oral QHS   Continuous Infusions: . sodium chloride 75 mL/hr (06/22/15 1826)    Assessment/Plan:  1. Multiple seizures. Medications as per neurology. Patient still needs to be in the ICU for nursing care as per neurology. Case discussed with neurologist Dr. Doy Mince. Goal is to try to decrease the frequency of the seizures prior to disposition. Prolactin level only slightly high. Will need tertiary care referral as outpatient. 2. Anxiety depression on fluoxetine 3. Gastroesophageal reflux disease without esophagitis on PPI 4. History of stroke on aspirin  Code Status:     Code Status Orders        Start     Ordered   06/22/15 1815  Full code   Continuous     06/22/15 1814    Code Status History    Date Active Date Inactive Code Status Order ID Comments User Context   05/10/2015 11:02 PM 05/12/2015  2:45 PM Full Code [id number]  Vaughan Basta, MD Inpatient   11/03/2014 11:20 PM 11/04/2014  7:18 PM Full Code [id number]  Lytle Butte, MD ED    Advance Directive Documentation        Most Recent Value   Type of Advance Directive  Healthcare Power of [place], Living will [POA: [name]]   Pre-existing out of facility DNR order (yellow form or pink MOST form)     "MOST" Form in Place?       Family Communication: Case discussed with husband at the bedside Disposition Plan:  Home once seizure frequency lessens  Consultants:  Neurology  Time spent: 30 minutes  [place], [place]

## 2015-06-23 NOTE — Consult Note (Signed)
[facility] Face-to-Face Psychiatry Consult   Reason for Consult:  Consult for this 45 year old woman known to me from previous encounters who has a history of possible pseudoseizures. Referring Physician:  Leslye Peer Patient Identification: [name] MRN:  [id number] Principal Diagnosis: Conversion disorder (pseudoseizures) Diagnosis:   Patient Active Problem List   Diagnosis Date Noted  . Status epilepticus due to refractory epilepsy ([place]) [G40.301] 06/22/2015  . Pseudoseizures [F44.5] 05/11/2015  . Migraines [G43.909] 05/11/2015  . Abdominal pain [R10.9] 05/10/2015  . Seizures ([place]) [R56.9] 05/17/2014  . Respiratory failure, acute ([place]) [J96.00] 05/16/2014  . COPD (chronic obstructive pulmonary disease) ([place]) [J44.9]   . Emphysema/COPD ([place]) [J43.9]   . Sleep apnea [G47.30]   . Stroke [facility]) [I63.9]   . Seizure ([place]) [R56.9]     Total Time spent with patient: 30 minutes  Subjective:   [name] is a 45 y.o. female patient admitted with patient was admitted to the hospital with a return of her epileptic-like spells.  HPI:  Patient was asleep in the critical care unit when I came to see her. She opened her eyes and I first came in and spoke her name but then closed her eyes and would not open them again despite my asking her multiple times and would not speak to me. She had a slight smile on her face but was lying still otherwise. Chart reviewed. I am also familiar with this patient from several prior evaluations. At this point I have no new information except that her spells of seizure-like activity have continued to occur frequently and on this occasion have actually been severe enough to put her in the intensive care unit.  Social history: Presumed that the patient is still with her husband although I did not see him there. Last time I saw them about a month ago husband was extremely supportive.  Substance abuse history: Nonidentified.  Medical history: Patient has been  troubled by the spells for quite a long time. She has spells that may or may not be epileptic but have so far resisted clear diagnosis as being epileptic. They have largely resisted multiple anticonvulsant medications.  Past Psychiatric History: Patient has some minor history of depression and anxiety but no history of suicidality no clear known history of psychosis enema on my last evaluation with her was minimizing or denying any recent mood or anxiety symptoms. No new information available.  Risk to Self: Is patient at risk for suicide?: No Risk to Others:   Prior Inpatient Therapy:   Prior Outpatient Therapy:    Past Medical History:  Past Medical History  Diagnosis Date  . Anxiety   . Arthritis   . Asthma   . Migraines   . Depression   . Emphysema/COPD ([place])   . Sleep apnea   . Stroke ([place])   . Seizure [facility])     Past Surgical History  Procedure Laterality Date  . Cesarean section  1993  . Partial hysterectomy  1995  . Abdominal hysterectomy      partial  . Tonsillectomy     Family History:  Family History  Problem Relation Age of Onset  . Diabetes Neg Hx   . Lung cancer Mother    Family Psychiatric  History: None available Social History:  History  Alcohol Use No     History  Drug Use No    Social History   Social History  . Marital Status: Married    Spouse Name: N/A  . Number of Children: N/A  .  Years of Education: N/A   Social History Main Topics  . Smoking status: Current Every Day Smoker    Types: E-cigarettes  . Smokeless tobacco: Never Used  . Alcohol Use: No  . Drug Use: No  . Sexual Activity: Not Asked   Other Topics Concern  . None   Social History Narrative   Patient is married and lives at home with her husband.             Additional Social History:    Allergies:   Allergies  Allergen Reactions  . Bee Venom Anaphylaxis  . Shellfish Allergy Anaphylaxis  . Contrast Media [Iodinated Diagnostic Agents] Other (See Comments)     Reaction:  Unknown   . Penicillins Other (See Comments)    Reaction:  Seizures  Has patient had a PCN reaction causing immediate rash, facial/tongue/throat swelling, SOB or lightheadedness with hypotension: No Has patient had a PCN reaction causing severe rash involving mucus membranes or skin necrosis: No Has patient had a PCN reaction that required hospitalization Yes Has patient had a PCN reaction occurring within the last 10 years: Yes If all of the above answers are "NO", then may proceed with Cephalosporin use.  . Sulfa Antibiotics Itching    Labs:  Results for orders placed or performed during the hospital encounter of 06/22/15 (from the past 48 hour(s))  Urinalysis complete, with microscopic ([facility] only)     Status: Abnormal   Collection Time: 06/22/15  1:13 PM  Result Value Ref Range   Color, Urine YELLOW (A) YELLOW   APPearance CLEAR (A) CLEAR   Glucose, UA NEGATIVE NEGATIVE mg/dL   Bilirubin Urine NEGATIVE NEGATIVE   Ketones, ur NEGATIVE NEGATIVE mg/dL   Specific Gravity, Urine 1.020 1.005 - 1.030   Hgb urine dipstick 1+ (A) NEGATIVE   pH 6.0 5.0 - 8.0   Protein, ur NEGATIVE NEGATIVE mg/dL   Nitrite NEGATIVE NEGATIVE   Leukocytes, UA NEGATIVE NEGATIVE   RBC / HPF 0-5 0 - 5 RBC/hpf   WBC, UA 0-5 0 - 5 WBC/hpf   Bacteria, UA RARE (A) NONE SEEN   Squamous Epithelial / LPF 0-5 (A) NONE SEEN   Mucous PRESENT   Urine Drug Screen, Qualitative ([facility] only)     Status: None   Collection Time: 06/22/15  1:13 PM  Result Value Ref Range   Tricyclic, Ur Screen NONE DETECTED NONE DETECTED   Amphetamines, Ur Screen NONE DETECTED NONE DETECTED   MDMA (Ecstasy)Ur Screen NONE DETECTED NONE DETECTED   Cocaine Metabolite,Ur [place] NONE DETECTED NONE DETECTED   Opiate, Ur Screen NONE DETECTED NONE DETECTED   Phencyclidine (PCP) Ur S NONE DETECTED NONE DETECTED   Cannabinoid 50 Ng, Ur [place] NONE DETECTED NONE DETECTED   Barbiturates, Ur Screen NONE DETECTED NONE DETECTED   Benzodiazepine, Ur  Scrn NONE DETECTED NONE DETECTED   Methadone Scn, Ur NONE DETECTED NONE DETECTED    Comment: (NOTE) 725  Tricyclics, urine               Cutoff 1000 ng/mL 200  Amphetamines, urine             Cutoff 1000 ng/mL 300  MDMA (Ecstasy), urine           Cutoff 500 ng/mL 400  Cocaine Metabolite, urine       Cutoff 300 ng/mL 500  Opiate, urine                   Cutoff 300 ng/mL 600  Phencyclidine (PCP), urine      Cutoff 25 ng/mL 700  Cannabinoid, urine              Cutoff 50 ng/mL 800  Barbiturates, urine             Cutoff 200 ng/mL 900  Benzodiazepine, urine           Cutoff 200 ng/mL 1000 Methadone, urine                Cutoff 300 ng/mL 1100 1200 The urine drug screen provides only a preliminary, unconfirmed 1300 analytical test result and should not be used for non-medical 1400 purposes. Clinical consideration and professional judgment should 1500 be applied to any positive drug screen result due to possible 1600 interfering substances. A more specific alternate chemical method 1700 must be used in order to obtain a confirmed analytical result.  1800 Gas chromato graphy / mass spectrometry (GC/MS) is the preferred 1900 confirmatory method.   Valproic acid level     Status: None   Collection Time: 06/22/15  1:38 PM  Result Value Ref Range   Valproic Acid Lvl 78 50.0 - 100.0 ug/mL  Prolactin     Status: None   Collection Time: 06/22/15  1:38 PM  Result Value Ref Range   Prolactin 21.6 4.8 - 23.3 ng/mL    Comment: (NOTE) Performed At: [facility] [place], [place] [id number] Lindon Romp MD LA:[id number]   Basic metabolic panel     Status: Abnormal   Collection Time: 06/22/15  1:38 PM  Result Value Ref Range   Sodium 140 135 - 145 mmol/L   Potassium 3.8 3.5 - 5.1 mmol/L   Chloride 109 101 - 111 mmol/L   CO2 18 (L) 22 - 32 mmol/L   Glucose, Bld 101 (H) 65 - 99 mg/dL   BUN 15 6 - 20 mg/dL   Creatinine, Ser 0.76 0.44 - 1.00 mg/dL   Calcium 9.4 8.9 -  10.3 mg/dL   GFR calc non Af Amer >60 >60 mL/min   GFR calc Af Amer >60 >60 mL/min    Comment: (NOTE) The eGFR has been calculated using the CKD EPI equation. This calculation has not been validated in all clinical situations. eGFR's persistently <60 mL/min signify possible Chronic Kidney Disease.    Anion gap 13 5 - 15  CBC with Differential     Status: None   Collection Time: 06/22/15  1:38 PM  Result Value Ref Range   WBC 6.3 3.6 - 11.0 K/uL   RBC 4.23 3.80 - 5.20 MIL/uL   Hemoglobin 12.5 12.0 - 16.0 g/dL   HCT 37.0 35.0 - 47.0 %   MCV 87.4 80.0 - 100.0 fL   MCH 29.5 26.0 - 34.0 pg   MCHC 33.7 32.0 - 36.0 g/dL   RDW 12.7 11.5 - 14.5 %   Platelets 192 150 - 440 K/uL   Neutrophils Relative % 64 %   Neutro Abs 4.0 1.4 - 6.5 K/uL   Lymphocytes Relative 25 %   Lymphs Abs 1.5 1.0 - 3.6 K/uL   Monocytes Relative 10 %   Monocytes Absolute 0.7 0.2 - 0.9 K/uL   Eosinophils Relative 1 %   Eosinophils Absolute 0.0 0 - 0.7 K/uL   Basophils Relative 0 %   Basophils Absolute 0.0 0 - 0.1 K/uL  Glucose, capillary     Status: None   Collection Time: 06/22/15 10:11 PM  Result Value Ref Range   Glucose-Capillary  94 65 - 99 mg/dL  MRSA PCR Screening     Status: None   Collection Time: 06/22/15 11:04 PM  Result Value Ref Range   MRSA by PCR NEGATIVE NEGATIVE    Comment:        The GeneXpert MRSA Assay (FDA approved for NASAL specimens only), is one component of a comprehensive MRSA colonization surveillance program. It is not intended to diagnose MRSA infection nor to guide or monitor treatment for MRSA infections.     Current Facility-Administered Medications  Medication Dose Route Frequency Provider Last Rate Last Dose  . 0.9 %  sodium chloride infusion  75 mL/hr Intravenous Continuous Max Sane, MD 75 mL/hr at 06/22/15 1826 75 mL/hr at 06/22/15 1826  . acetaminophen (TYLENOL) tablet 1,000 mg  1,000 mg Oral Q6H PRN Max Sane, MD      . albuterol (PROVENTIL) (2.5 MG/3ML)  0.083% nebulizer solution 2.5 mg  2.5 mg Nebulization Q6H PRN Max Sane, MD      . aspirin EC tablet 81 mg  81 mg Oral Daily Max Sane, MD   81 mg at 06/23/15 0916  . divalproex (DEPAKOTE) DR tablet 500 mg  500 mg Oral BID Max Sane, MD   500 mg at 06/23/15 0917  . enoxaparin (LOVENOX) injection 40 mg  40 mg Subcutaneous Q24H Max Sane, MD   40 mg at 06/22/15 1959  . EPINEPHrine (EPI-PEN) injection 0.3 mg  0.3 mg Intramuscular Once PRN Max Sane, MD      . FLUoxetine (PROZAC) capsule 20 mg  20 mg Oral Daily Max Sane, MD   20 mg at 06/23/15 0916  . gabapentin (NEURONTIN) capsule 300 mg  300 mg Oral BID Max Sane, MD   300 mg at 06/23/15 0916  . ibuprofen (ADVIL,MOTRIN) tablet 600 mg  600 mg Oral Once Lisa Roca, MD   Stopped at 06/22/15 1230  . lacosamide (VIMPAT) tablet 200 mg  200 mg Oral BID Max Sane, MD   200 mg at 06/23/15 0917  . loratadine (CLARITIN) tablet 10 mg  10 mg Oral Daily Max Sane, MD   10 mg at 06/23/15 0916  . mirtazapine (REMERON) tablet 15 mg  15 mg Oral QHS Max Sane, MD   15 mg at 06/22/15 1955  . pantoprazole (PROTONIX) EC tablet 40 mg  40 mg Oral Daily Max Sane, MD   40 mg at 06/23/15 0918  . promethazine (PHENERGAN) tablet 25 mg  25 mg Oral Once Lisa Roca, MD   Stopped at 06/22/15 1230  . promethazine (PHENERGAN) tablet 25 mg  25 mg Oral Q6H PRN Max Sane, MD      . topiramate (TOPAMAX) tablet 25 mg  25 mg Oral Daily Vipul Shah, MD   25 mg at 06/23/15 3500   And  . topiramate (TOPAMAX) tablet 50 mg  50 mg Oral QHS Max Sane, MD   50 mg at 06/22/15 1910    Musculoskeletal: Strength & Muscle Tone: decreased Gait & Station: unable to stand Patient leans: N/A  Psychiatric Specialty Exam: Review of Systems  Unable to perform ROS: medical condition    Blood pressure 115/73, pulse 90, temperature 98.3 F (36.8 C), temperature source Oral, resp. rate 13, height '5\' 2"'$  (1.575 m), weight 69.854 kg (154 lb), SpO2 94 %.Body mass index is 28.16 kg/(m^2).   General Appearance: Fairly Groomed  Engineer, water::  None  Speech:  Would not speak to me  Volume:  Decreased  Mood:  Negative  Affect:  Negative  Thought Process:  Negative  Orientation:  Negative  Thought Content:  Negative  Suicidal Thoughts:  No  Homicidal Thoughts:  No  Memory:  Negative  Judgement:  Negative  Insight:  Negative  Psychomotor Activity:  Negative  Concentration:  Negative  Recall:  Negative  Fund of Knowledge:Negative  Language: Negative  Akathisia:  Negative  Handed:  Right  AIMS (if indicated):     Assets:  Social Support  ADL's:  Impaired  Cognition: Impaired,  Mild  Sleep:      Treatment Plan Summary: Plan Patient known to me from several prior encounters. On previous evaluations it has appeared planed to me that there is no direct evidence of epilepsy as normally defined. On the other hand I have no information to diagnose any treatable psychiatric condition. Patient clearly is impaired from her condition whatever it is. I doubt that I will have anything to add to her care at this point. I will be glad to come back in follow-up and see if I can speak with her. Otherwise I would defer to neurology for appropriate workup and treatment /  Disposition: No evidence of imminent risk to self or others at present.    Alethia Berthold, MD 06/23/2015 7:33 PM

## 2015-06-23 NOTE — Progress Notes (Signed)
Pt had another episode of what appears to be seizure activity heart rate elevated to 140's, bilateral upper and lower  extremities extended activity lasted for 60 seconds notified Dr. Leslye Peer pt currently sleeping post seizure activity with O2 sats 100% on 2L O2 via nasal canula

## 2015-06-23 NOTE — Progress Notes (Signed)
Pt had seizure lasting one minute. Elink camera'd in. Dr. Juanell Fairly came to room after. Phenobarbitol given as ordered. Pt experienced no postictal state and was oriented and answering questions and engaging in conversation.

## 2015-06-24 DIAGNOSIS — F445 Conversion disorder with seizures or convulsions: Principal | ICD-10-CM

## 2015-06-24 LAB — CBC
HCT: 35.8 % (ref 35.0–47.0)
HEMOGLOBIN: 12 g/dL (ref 12.0–16.0)
MCH: 29.3 pg (ref 26.0–34.0)
MCHC: 33.5 g/dL (ref 32.0–36.0)
MCV: 87.3 fL (ref 80.0–100.0)
Platelets: 177 10*3/uL (ref 150–440)
RBC: 4.11 MIL/uL (ref 3.80–5.20)
RDW: 12.6 % (ref 11.5–14.5)
WBC: 4.1 10*3/uL (ref 3.6–11.0)

## 2015-06-24 LAB — BASIC METABOLIC PANEL
ANION GAP: 7 (ref 5–15)
BUN: 12 mg/dL (ref 6–20)
CALCIUM: 8.6 mg/dL — AB (ref 8.9–10.3)
CHLORIDE: 112 mmol/L — AB (ref 101–111)
CO2: 24 mmol/L (ref 22–32)
Creatinine, Ser: 0.8 mg/dL (ref 0.44–1.00)
GFR calc non Af Amer: >60 mL/min (ref >60)
Glucose, Bld: 125 mg/dL — ABNORMAL HIGH (ref 65–99)
Potassium: 3.2 mmol/L — ABNORMAL LOW (ref 3.5–5.1)
Sodium: 143 mmol/L (ref 135–145)

## 2015-06-24 LAB — GLUCOSE, CAPILLARY: Glucose-Capillary: 93 mg/dL (ref 65–99)

## 2015-06-24 MED ORDER — POTASSIUM CHLORIDE CRYS ER 20 MEQ PO TBCR
40.0000 meq | EXTENDED_RELEASE_TABLET | Freq: Once | ORAL | Status: AC
  Administered 2015-06-24: 40 meq via ORAL
  Filled 2015-06-24: qty 2

## 2015-06-24 MED ORDER — KETOROLAC TROMETHAMINE 10 MG PO TABS
10.0000 mg | ORAL_TABLET | Freq: Three times a day (TID) | ORAL | Status: DC | PRN
  Administered 2015-06-24 – 2015-06-27 (×5): 10 mg via ORAL
  Filled 2015-06-24 (×5): qty 1

## 2015-06-24 NOTE — Progress Notes (Signed)
[place] for Drug interaction monitoring on antiepileptic medications Indication: Drug interactions  Allergies  Allergen Reactions  . Bee Venom Anaphylaxis  . Shellfish Allergy Anaphylaxis  . Contrast Media [Iodinated Diagnostic Agents] Other (See Comments)    Reaction:  Unknown   . Penicillins Other (See Comments)    Reaction:  Seizures  Has patient had a PCN reaction causing immediate rash, facial/tongue/throat swelling, SOB or lightheadedness with hypotension: No Has patient had a PCN reaction causing severe rash involving mucus membranes or skin necrosis: No Has patient had a PCN reaction that required hospitalization Yes Has patient had a PCN reaction occurring within the last 10 years: Yes If all of the above answers are "NO", then may proceed with Cephalosporin use.  . Sulfa Antibiotics Itching    Patient Measurements: Height: 5\' 2"  (157.5 cm) Weight: 154 lb (69.854 kg) IBW/kg (Calculated) : 50.1 Adjusted Body Weight:   Vital Signs: Temp: 97.9 F (36.6 C) (04/20 1447) Temp Source: Oral (04/20 1447) BP: 107/63 mmHg (04/20 1447) Pulse Rate: 74 (04/20 1447) Intake/Output from previous day: 04/19 0701 - 04/20 0700 In: 2667.5 [I.V.:2667.5] Out: 1800 [Urine:1800] Intake/Output from this shift: Total I/O In: 508.8 [I.V.:508.8] Out: 200 [Urine:200]  Labs:  Recent Labs  06/22/15 1338 06/24/15 1025  WBC 6.3 4.1  HGB 12.5 12.0  HCT 37.0 35.8  PLT 192 177  CREATININE 0.76 0.80   Estimated Creatinine Clearance: 79.6 mL/min (by C-G formula based on Cr of 0.8).   Microbiology: Recent Results (from the past 720 hour(s))  MRSA PCR Screening     Status: None   Collection Time: 06/22/15 11:04 PM  Result Value Ref Range Status   MRSA by PCR NEGATIVE NEGATIVE Final    Comment:        The GeneXpert MRSA Assay (FDA approved for NASAL specimens only), is one component of a comprehensive MRSA colonization surveillance  program. It is not intended to diagnose MRSA infection nor to guide or monitor treatment for MRSA infections.     Medical History: Past Medical History  Diagnosis Date  . Anxiety   . Arthritis   . Asthma   . Migraines   . Depression   . Emphysema/COPD ([place])   . Sleep apnea   . Stroke ([place])   . Seizure ([place])     Medications:  Prescriptions prior to admission  Medication Sig Dispense Refill Last Dose  . acetaminophen (TYLENOL) 500 MG tablet Take 1,000 mg by mouth every 6 (six) hours as needed for mild pain or headache.   PRN at PRN  . albuterol (PROVENTIL HFA;VENTOLIN HFA) 108 (90 BASE) MCG/ACT inhaler Inhale 2 puffs into the lungs every 6 (six) hours as needed for wheezing or shortness of breath.    PRN at PRN  . albuterol (PROVENTIL) (2.5 MG/3ML) 0.083% nebulizer solution Take 2.5 mg by nebulization every 6 (six) hours as needed for wheezing or shortness of breath. Reported on 05/10/2015   PRN at PRN  . aspirin EC 81 MG tablet Take 81 mg by mouth daily.   unknown at unknown   . divalproex (DEPAKOTE) 500 MG DR tablet Take 500 mg by mouth 2 (two) times daily.   unknown at unknown   . EPINEPHrine (EPIPEN 2-PAK) 0.3 mg/0.3 mL IJ SOAJ injection Inject 0.3 mg into the muscle once as needed (for severe allergic reaction).   PRN at PRN  . FLUoxetine (PROZAC) 20 MG capsule Take 20 mg by mouth daily.   unknown at  unknown   . gabapentin (NEURONTIN) 300 MG capsule Take 300 mg by mouth 2 (two) times daily.   unknown at unknown   . lacosamide (VIMPAT) 200 MG TABS tablet Take 1 tablet (200 mg total) by mouth 2 (two) times daily. 60 tablet 0 unknown at unknown   . loratadine (CLARITIN) 10 MG tablet Take 10 mg by mouth daily.   unknown at unknown   . meloxicam (MOBIC) 7.5 MG tablet Take 7.5-15 mg by mouth daily as needed for pain.    PRN at PRN  . mirtazapine (REMERON) 15 MG tablet Take 15 mg by mouth at bedtime.   unknown at unknown   . pantoprazole (PROTONIX) 40 MG tablet Take 40 mg by mouth  daily.   unknown at unknown   . promethazine (PHENERGAN) 25 MG tablet Take 25 mg by mouth every 6 (six) hours as needed for nausea or vomiting.   PRN at PRN  . topiramate (TOPAMAX) 50 MG tablet Take 50 mg by mouth daily.   unknown at unknown    Scheduled:  . aspirin EC  81 mg Oral Daily  . divalproex  500 mg Oral BID  . enoxaparin (LOVENOX) injection  40 mg Subcutaneous Q24H  . FLUoxetine  20 mg Oral Daily  . gabapentin  300 mg Oral BID  . ibuprofen  600 mg Oral Once  . lacosamide  200 mg Oral BID  . loratadine  10 mg Oral Daily  . mirtazapine  15 mg Oral QHS  . pantoprazole  40 mg Oral Daily  . promethazine  25 mg Oral Once  . topiramate  25 mg Oral Daily   And  . topiramate  50 mg Oral QHS    Assessment: Pharmacy consulted to assess medication list for any potential drug interactions and monitor antiepileptics  Goal of Therapy:    Plan:  Patient medication list has drug interactions; however medications are PTA medications.  Follow closely for any changes or any documentation/signs of toxicity or suboptimal effects.    Ulice Dash D 06/24/2015,2:59 PM

## 2015-06-24 NOTE — Plan of Care (Signed)
Problem: Pain Managment: Goal: General experience of comfort will improve Outcome: Progressing Tylenol given times 1 dose MD paged for pain medication. Received order for toradol.  Problem: Activity: Goal: Risk for activity intolerance will decrease Outcome: Progressing Ambulated with PT. Tolerated well.

## 2015-06-24 NOTE — Progress Notes (Signed)
Patient ID: [name], female   DOB: Aug 22, 1970, 45 y.o.   MRN: [id number] [facility] PROGRESS NOTE  [name] [id number] DOB: 21-Sep-1970 DOA: 06/22/2015 PCP: Glendon Axe, MD  HPI/Subjective: Patient feels weird. She complains of a headache. She's had a couple more seizures since I saw her yesterday. She has a lot of concerns about seizure versus pseudoseizure.  Objective: Filed Vitals:   06/24/15 1200 06/24/15 1300  BP: 117/75 107/69  Pulse: 91 75  Temp:    Resp: 17 23    Filed Weights   06/22/15 1221 06/22/15 1800  Weight: 69.899 kg (154 lb 1.6 oz) 69.854 kg (154 lb)    ROS: Review of Systems  Constitutional: Negative for fever and chills.  Eyes: Negative for blurred vision.  Respiratory: Negative for cough and shortness of breath.   Cardiovascular: Negative for chest pain.  Gastrointestinal: Negative for nausea, vomiting, diarrhea and constipation.  Genitourinary: Negative for dysuria.  Musculoskeletal: Negative for joint pain.  Neurological: Positive for seizures. Negative for dizziness and headaches.   Exam: Physical Exam  Constitutional: She is oriented to person, place, and time.  HENT:  Nose: No mucosal edema.  Mouth/Throat: No oropharyngeal exudate or posterior oropharyngeal edema.  Eyes: Conjunctivae, EOM and lids are normal. Pupils are equal, round, and reactive to light.  Neck: No JVD present. Carotid bruit is not present. No edema present. No thyroid mass and no thyromegaly present.  Cardiovascular: S1 normal and S2 normal.  Exam reveals no gallop.   No murmur heard. Pulses:      Dorsalis pedis pulses are 2+ on the right side, and 2+ on the left side.  Respiratory: No respiratory distress. She has no wheezes. She has no rhonchi. She has no rales.  GI: Soft. Bowel sounds are normal. There is no tenderness.  Musculoskeletal:       Right ankle: She exhibits no swelling.       Left ankle: She exhibits no swelling.  Lymphadenopathy:   She has no cervical adenopathy.  Neurological: She is alert and oriented to person, place, and time. No cranial nerve deficit.  Skin: Skin is warm. No rash noted. Nails show no clubbing.  Psychiatric: She has a normal mood and affect.      Data Reviewed: Basic Metabolic Panel:  Recent Labs Lab 06/22/15 1338 06/24/15 1025  NA 140 143  K 3.8 3.2*  CL 109 112*  CO2 18* 24  GLUCOSE 101* 125*  BUN 15 12  CREATININE 0.76 0.80  CALCIUM 9.4 8.6*   CBC:  Recent Labs Lab 06/22/15 1338 06/24/15 1025  WBC 6.3 4.1  NEUTROABS 4.0  --   HGB 12.5 12.0  HCT 37.0 35.8  MCV 87.4 87.3  PLT 192 177    CBG:  Recent Labs Lab 06/22/15 2211 06/23/15 1957  GLUCAP 94 93    Recent Results (from the past 240 hour(s))  MRSA PCR Screening     Status: None   Collection Time: 06/22/15 11:04 PM  Result Value Ref Range Status   MRSA by PCR NEGATIVE NEGATIVE Final    Comment:        The GeneXpert MRSA Assay (FDA approved for NASAL specimens only), is one component of a comprehensive MRSA colonization surveillance program. It is not intended to diagnose MRSA infection nor to guide or monitor treatment for MRSA infections.      Studies: Ct Head Wo Contrast  06/22/2015  CLINICAL DATA:  Seizure with altered mental status EXAM: CT HEAD WITHOUT CONTRAST TECHNIQUE:  Contiguous axial images were obtained from the base of the skull through the vertex without intravenous contrast. COMPARISON:  Head CT May 10, 2015 FINDINGS: The ventricles are normal in size and configuration. There is mild frontal atrophy bilaterally near the vertex. There is no intracranial mass, hemorrhage, extra-axial fluid collection, or midline shift. The gray-white compartments are normal. No acute infarct evident. The bony calvarium appears intact. The mastoid air cells are clear. No intraorbital lesions are evident. There is a small retention cyst in the posterior inferior left frontal sinus. IMPRESSION: Mild frontal  atrophy bilaterally near the vertex. Ventricles normal in size and configuration. No intracranial mass, hemorrhage, or focal gray -white compartment lesion. Minimal left frontal paranasal sinus disease. Electronically Signed   By: Lowella Grip III M.D.   On: 06/22/2015 15:00    Scheduled Meds: . aspirin EC  81 mg Oral Daily  . divalproex  500 mg Oral BID  . enoxaparin (LOVENOX) injection  40 mg Subcutaneous Q24H  . FLUoxetine  20 mg Oral Daily  . gabapentin  300 mg Oral BID  . ibuprofen  600 mg Oral Once  . lacosamide  200 mg Oral BID  . loratadine  10 mg Oral Daily  . mirtazapine  15 mg Oral QHS  . pantoprazole  40 mg Oral Daily  . promethazine  25 mg Oral Once  . topiramate  25 mg Oral Daily   And  . topiramate  50 mg Oral QHS   Assessment/Plan:  1. Multiple seizures. Medications as per neurology.  Neurology happy with less frequent seizure-like activity. Transfer to floor bed. Goal is to try to decrease the frequency of the seizures prior to disposition. Prolactin level only slightly high. Will need tertiary care referral as outpatient. 2. Anxiety depression on fluoxetine 3. Gastroesophageal reflux disease without esophagitis on PPI 4. History of stroke on aspirin  Code Status:     Code Status Orders        Start     Ordered   06/22/15 1815  Full code   Continuous     06/22/15 1814    Code Status History    Date Active Date Inactive Code Status Order ID Comments User Context   05/10/2015 11:02 PM 05/12/2015  2:45 PM Full Code [id number]  Vaughan Basta, MD Inpatient   11/03/2014 11:20 PM 11/04/2014  7:18 PM Full Code [id number]  Lytle Butte, MD ED    Advance Directive Documentation        Most Recent Value   Type of Advance Directive  Healthcare Power of [place], Living will [POA: [name]]   Pre-existing out of facility DNR order (yellow form or pink MOST form)     "MOST" Form in Place?       Family Communication: Case discussed with husband  yesterday Disposition Plan: Home once seizure frequency lessens  Consultants:  Neurology  Time spent: 24 minutes  [place], [place]

## 2015-06-24 NOTE — Progress Notes (Signed)
Subjective: Seizure-like activity frequency improved with increase in Topamax  Patient tolerating well.  Had a seizure at about 1230am and 730am today.  Currently awake and alert.    Objective: Current vital signs: BP 106/67 mmHg  Pulse 70  Temp(Src) 97.5 F (36.4 C) (Oral)  Resp 14  Ht 5\' 2"  (1.575 m)  Wt 69.854 kg (154 lb)  BMI 28.16 kg/m2  SpO2 100%  LMP  (LMP Unknown) Vital signs in last 24 hours: Temp:  [97.5 F (36.4 C)-98.6 F (37 C)] 97.5 F (36.4 C) (04/20 0800) Pulse Rate:  [62-121] 70 (04/20 0900) Resp:  [9-23] 14 (04/20 0900) BP: (90-131)/(57-85) 106/67 mmHg (04/20 0900) SpO2:  [94 %-100 %] 100 % (04/20 0900)  Intake/Output from previous day: 04/19 0701 - 04/20 0700 In: 2667.5 [I.V.:2667.5] Out: 1800 [Urine:1800] Intake/Output this shift: Total I/O In: 225 [I.V.:225] Out: -  Nutritional status: Diet regular Room service appropriate?: Yes; Fluid consistency:: Thin  Neurologic Exam: Mental Status: Awake, alert. Speech fluent without evidence of aphasia. Able to follow 3 step commands without difficulty. Cranial Nerves: II: Discs flat bilaterally; Visual fields grossly normal, pupils equal, round, reactive to light and accommodation III,IV, VI: ptosis not present, extra-ocular motions intact bilaterally V,VII: smile symmetric, facial light touch sensation normal bilaterally VIII: hearing normal bilaterally IX,X: gag reflex present XI: bilateral shoulder shrug XII: midline tongue extension Motor: Right :Upper extremity 5/5Left: Upper extremity 5/5 Lower extremity 5/5Lower extremity 5/5   Lab Results: Basic Metabolic Panel:  Recent Labs Lab 06/22/15 1338  NA 140  K 3.8  CL 109  CO2 18*  GLUCOSE 101*  BUN 15  CREATININE 0.76  CALCIUM 9.4    Liver Function Tests: No results for input(s): AST, ALT, ALKPHOS, BILITOT, PROT, ALBUMIN  in the last 168 hours. No results for input(s): LIPASE, AMYLASE in the last 168 hours. No results for input(s): AMMONIA in the last 168 hours.  CBC:  Recent Labs Lab 06/22/15 1338  WBC 6.3  NEUTROABS 4.0  HGB 12.5  HCT 37.0  MCV 87.4  PLT 192    Cardiac Enzymes: No results for input(s): CKTOTAL, CKMB, CKMBINDEX, TROPONINI in the last 168 hours.  Lipid Panel: No results for input(s): CHOL, TRIG, HDL, CHOLHDL, VLDL, LDLCALC in the last 168 hours.  CBG:  Recent Labs Lab 06/22/15 2211 06/23/15 1957  GLUCAP 94 93    Microbiology: Results for orders placed or performed during the hospital encounter of 06/22/15  MRSA PCR Screening     Status: None   Collection Time: 06/22/15 11:04 PM  Result Value Ref Range Status   MRSA by PCR NEGATIVE NEGATIVE Final    Comment:        The GeneXpert MRSA Assay (FDA approved for NASAL specimens only), is one component of a comprehensive MRSA colonization surveillance program. It is not intended to diagnose MRSA infection nor to guide or monitor treatment for MRSA infections.     Coagulation Studies: No results for input(s): LABPROT, INR in the last 72 hours.  Imaging: Ct Head Wo Contrast  06/22/2015  CLINICAL DATA:  Seizure with altered mental status EXAM: CT HEAD WITHOUT CONTRAST TECHNIQUE: Contiguous axial images were obtained from the base of the skull through the vertex without intravenous contrast. COMPARISON:  Head CT May 10, 2015 FINDINGS: The ventricles are normal in size and configuration. There is mild frontal atrophy bilaterally near the vertex. There is no intracranial mass, hemorrhage, extra-axial fluid collection, or midline shift. The gray-white compartments are normal. No acute  infarct evident. The bony calvarium appears intact. The mastoid air cells are clear. No intraorbital lesions are evident. There is a small retention cyst in the posterior inferior left frontal sinus. IMPRESSION: Mild frontal atrophy bilaterally  near the vertex. Ventricles normal in size and configuration. No intracranial mass, hemorrhage, or focal gray -white compartment lesion. Minimal left frontal paranasal sinus disease. Electronically Signed   By: Lowella Grip III M.D.   On: 06/22/2015 15:00    Medications:  I have reviewed the patient's current medications. Scheduled: . aspirin EC  81 mg Oral Daily  . divalproex  500 mg Oral BID  . enoxaparin (LOVENOX) injection  40 mg Subcutaneous Q24H  . FLUoxetine  20 mg Oral Daily  . gabapentin  300 mg Oral BID  . ibuprofen  600 mg Oral Once  . lacosamide  200 mg Oral BID  . loratadine  10 mg Oral Daily  . mirtazapine  15 mg Oral QHS  . pantoprazole  40 mg Oral Daily  . promethazine  25 mg Oral Once  . topiramate  25 mg Oral Daily   And  . topiramate  50 mg Oral QHS    Assessment/Plan: Patient with improved seizure-like activity frequency.  Tolerating increase in Topamax.    Recommendations: 1.  Continue seizure precautions and anticonvulsants at current doses.     LOS: 2 days   Emily Goodell, MD Neurology [phone] 06/24/2015  10:13 AM

## 2015-06-24 NOTE — Progress Notes (Signed)
Patient has had 2 more episodes of what appear to be seizure like activity this evening so far.  During both episodes, patient's HR went into the 130's-140's and patient's extremities were stiff and extended.  Both episodes lasted less than 30 seconds and patient become solemn afterwards, responding to voice moments later and answering questions appropriately.  Patient is now resting comfortably with no further complaints.  Will continue to monitor.

## 2015-06-24 NOTE — Consult Note (Signed)
[place] Psychiatry Consult   Reason for Consult:  Consult for 45 year old woman with a history of psychogenic seizures Referring Physician:  Leslye Peer Patient Identification: Cleveland Paiz MRN:  [id number] Principal Diagnosis: Psychogenic seizures Diagnosis:   Patient Active Problem List   Diagnosis Date Noted  . Pseudoseizure ([place]) [F44.5]   . Status epilepticus due to refractory epilepsy ([place]) [G40.301] 06/22/2015  . Pseudoseizures [F44.5] 05/11/2015  . Migraines [G43.909] 05/11/2015  . Abdominal pain [R10.9] 05/10/2015  . Seizures ([place]) [R56.9] 05/17/2014  . Respiratory failure, acute ([place]) [J96.00] 05/16/2014  . COPD (chronic obstructive pulmonary disease) ([place]) [J44.9]   . Emphysema/COPD ([place]) [J43.9]   . Sleep apnea [G47.30]   . Stroke [facility]) [I63.9]   . Seizure ([place]) [R56.9]     Total Time spent with patient: 45 minutes  Subjective:   Rabia Argote is a 44 y.o. female patient admitted with "I'm just having pain in my side".  HPI:  Patient interviewed. She had been moved out of the intensive care unit and is in a regular hospital bed today. She was able to converse with me. Patient with a history of seizures with high likelihood of psychogenic nature. Back in the hospital again for seizures presenting this time as something like status epilepticus. Now moved out of the intensive care unit. I have seen this patient several times before. Today she denies any symptoms of depression. Denies depressed mood. Denies hopelessness denies negative thoughts. Totally denies suicidal ideation. Feels like she has stress from her illness but otherwise does not report any new anxiety or stress. Says that she's been compliant with all of her medicines outside the hospital. Good relationship with her husband. No clear reason why she would've had an increase in seizure activity.  Social history: Married. Lives there husband. Does not work outside the home. Good relationship with her  husband as far as I been able to tell.  Medical history: Patient has a history of recurrent spells which have been worked up multiple times so far without any evidence that they are actually epileptic seizures. She sees a neurologist as an outpatient and is on multiple antiepileptic medicines. She is been evaluated by neurology multiple times in the hospital as well. Patient also has a history of COPD. She is complaining of flank pain today and says that she believes she has a kidney stone.  Substance abuse history: No history of substance abuse clearly identified.  Past Psychiatric History: Patient has had some treatment for depressive symptoms in the past but no history of suicide attempts. Has had evaluations multiple times in the past because of the psychogenic seizures without her being able to identify any emotional stress. She remains very opposed to the idea of her spells having psychogenic origin.  Risk to Self: Is patient at risk for suicide?: No Risk to Others:   Prior Inpatient Therapy:   Prior Outpatient Therapy:    Past Medical History:  Past Medical History  Diagnosis Date  . Anxiety   . Arthritis   . Asthma   . Migraines   . Depression   . Emphysema/COPD ([place])   . Sleep apnea   . Stroke ([place])   . Seizure [facility])     Past Surgical History  Procedure Laterality Date  . Cesarean section  1993  . Partial hysterectomy  1995  . Abdominal hysterectomy      partial  . Tonsillectomy     Family History:  Family History  Problem Relation Age of Onset  .  Diabetes Neg Hx   . Lung cancer Mother    Family Psychiatric  History: No identified family history Social History:  History  Alcohol Use No     History  Drug Use No    Social History   Social History  . Marital Status: Married    Spouse Name: N/A  . Number of Children: N/A  . Years of Education: N/A   Social History Main Topics  . Smoking status: Current Every Day Smoker    Types: E-cigarettes  .  Smokeless tobacco: Never Used  . Alcohol Use: No  . Drug Use: No  . Sexual Activity: Not Asked   Other Topics Concern  . None   Social History Narrative   Patient is married and lives at home with her husband.             Additional Social History:    Allergies:   Allergies  Allergen Reactions  . Bee Venom Anaphylaxis  . Shellfish Allergy Anaphylaxis  . Contrast Media [Iodinated Diagnostic Agents] Other (See Comments)    Reaction:  Unknown   . Penicillins Other (See Comments)    Reaction:  Seizures  Has patient had a PCN reaction causing immediate rash, facial/tongue/throat swelling, SOB or lightheadedness with hypotension: No Has patient had a PCN reaction causing severe rash involving mucus membranes or skin necrosis: No Has patient had a PCN reaction that required hospitalization Yes Has patient had a PCN reaction occurring within the last 10 years: Yes If all of the above answers are "NO", then may proceed with Cephalosporin use.  . Sulfa Antibiotics Itching    Labs:  Results for orders placed or performed during the hospital encounter of 06/22/15 (from the past 48 hour(s))  Glucose, capillary     Status: None   Collection Time: 06/22/15 10:11 PM  Result Value Ref Range   Glucose-Capillary 94 65 - 99 mg/dL  MRSA PCR Screening     Status: None   Collection Time: 06/22/15 11:04 PM  Result Value Ref Range   MRSA by PCR NEGATIVE NEGATIVE    Comment:        The GeneXpert MRSA Assay (FDA approved for NASAL specimens only), is one component of a comprehensive MRSA colonization surveillance program. It is not intended to diagnose MRSA infection nor to guide or monitor treatment for MRSA infections.   Glucose, capillary     Status: None   Collection Time: 06/23/15  7:57 PM  Result Value Ref Range   Glucose-Capillary 93 65 - 99 mg/dL  Valproic acid level     Status: None   Collection Time: 06/23/15  9:15 PM  Result Value Ref Range   Valproic Acid Lvl 63 50.0 -  100.0 ug/mL  Basic metabolic panel     Status: Abnormal   Collection Time: 06/24/15 10:25 AM  Result Value Ref Range   Sodium 143 135 - 145 mmol/L   Potassium 3.2 (L) 3.5 - 5.1 mmol/L   Chloride 112 (H) 101 - 111 mmol/L   CO2 24 22 - 32 mmol/L   Glucose, Bld 125 (H) 65 - 99 mg/dL   BUN 12 6 - 20 mg/dL   Creatinine, Ser 0.80 0.44 - 1.00 mg/dL   Calcium 8.6 (L) 8.9 - 10.3 mg/dL   GFR calc non Af Amer >60 >60 mL/min   GFR calc Af Amer >60 >60 mL/min    Comment: (NOTE) The eGFR has been calculated using the CKD EPI equation. This calculation has not been  validated in all clinical situations. eGFR's persistently <60 mL/min signify possible Chronic Kidney Disease.    Anion gap 7 5 - 15  CBC     Status: None   Collection Time: 06/24/15 10:25 AM  Result Value Ref Range   WBC 4.1 3.6 - 11.0 K/uL   RBC 4.11 3.80 - 5.20 MIL/uL   Hemoglobin 12.0 12.0 - 16.0 g/dL   HCT 35.8 35.0 - 47.0 %   MCV 87.3 80.0 - 100.0 fL   MCH 29.3 26.0 - 34.0 pg   MCHC 33.5 32.0 - 36.0 g/dL   RDW 12.6 11.5 - 14.5 %   Platelets 177 150 - 440 K/uL    Current Facility-Administered Medications  Medication Dose Route Frequency Provider Last Rate Last Dose  . acetaminophen (TYLENOL) tablet 1,000 mg  1,000 mg Oral Q6H PRN Max Sane, MD   1,000 mg at 06/24/15 1720  . albuterol (PROVENTIL) (2.5 MG/3ML) 0.083% nebulizer solution 2.5 mg  2.5 mg Nebulization Q6H PRN Max Sane, MD      . aspirin EC tablet 81 mg  81 mg Oral Daily Vipul Shah, MD   81 mg at 06/24/15 1004  . divalproex (DEPAKOTE) DR tablet 500 mg  500 mg Oral BID Max Sane, MD   500 mg at 06/24/15 1004  . enoxaparin (LOVENOX) injection 40 mg  40 mg Subcutaneous Q24H Max Sane, MD   40 mg at 06/23/15 2227  . EPINEPHrine (EPI-PEN) injection 0.3 mg  0.3 mg Intramuscular Once PRN Max Sane, MD      . FLUoxetine (PROZAC) capsule 20 mg  20 mg Oral Daily Vipul Shah, MD   20 mg at 06/24/15 1005  . gabapentin (NEURONTIN) capsule 300 mg  300 mg Oral BID Max Sane, MD   300 mg at 06/24/15 1004  . ibuprofen (ADVIL,MOTRIN) tablet 600 mg  600 mg Oral Once Lisa Roca, MD   Stopped at 06/22/15 1230  . lacosamide (VIMPAT) tablet 200 mg  200 mg Oral BID Max Sane, MD   200 mg at 06/24/15 1005  . loratadine (CLARITIN) tablet 10 mg  10 mg Oral Daily Max Sane, MD   10 mg at 06/24/15 1005  . mirtazapine (REMERON) tablet 15 mg  15 mg Oral QHS Max Sane, MD   15 mg at 06/23/15 2227  . pantoprazole (PROTONIX) EC tablet 40 mg  40 mg Oral Daily Max Sane, MD   40 mg at 06/24/15 1004  . promethazine (PHENERGAN) tablet 25 mg  25 mg Oral Once Lisa Roca, MD   Stopped at 06/22/15 1230  . promethazine (PHENERGAN) tablet 25 mg  25 mg Oral Q6H PRN Max Sane, MD   25 mg at 06/24/15 1533  . topiramate (TOPAMAX) tablet 25 mg  25 mg Oral Daily Vipul Shah, MD   25 mg at 06/24/15 1005   And  . topiramate (TOPAMAX) tablet 50 mg  50 mg Oral QHS Max Sane, MD   50 mg at 06/23/15 2226    Musculoskeletal: Strength & Muscle Tone: decreased Gait & Station: unable to stand Patient leans: N/A  Psychiatric Specialty Exam: Review of Systems  Constitutional: Negative.   HENT: Negative.   Eyes: Negative.   Respiratory: Negative.   Cardiovascular: Negative.   Gastrointestinal: Negative.   Musculoskeletal: Positive for myalgias.  Skin: Negative.   Neurological: Positive for seizures.  Psychiatric/Behavioral: Negative for depression, suicidal ideas, hallucinations, memory loss and substance abuse. The patient is not nervous/anxious and does not have insomnia.  Blood pressure 107/63, pulse 74, temperature 97.9 F (36.6 C), temperature source Oral, resp. rate 20, height '5\' 2"'$  (1.575 m), weight 69.854 kg (154 lb), SpO2 98 %.Body mass index is 28.16 kg/(m^2).  General Appearance: Fairly Groomed  Engineer, water::  Minimal  Speech:  Slow  Volume:  Decreased  Mood:  Dysphoric  Affect:  Constricted  Thought Process:  Goal Directed  Orientation:  Full (Time, Place, and  Person)  Thought Content:  Negative  Suicidal Thoughts:  No  Homicidal Thoughts:  No  Memory:  Immediate;   Good Recent;   Fair Remote;   Poor  Judgement:  Fair  Insight:  Fair  Psychomotor Activity:  Decreased  Concentration:  Fair  Recall:  [employer] of Knowledge:Fair  Language: Fair  Akathisia:  No  Handed:  Right  AIMS (if indicated):     Assets:  Communication Skills Desire for Improvement Financial Resources/Insurance Housing Intimacy Leisure Time Resilience Social Support  ADL's:  Impaired  Cognition: WNL  Sleep:      Treatment Plan Summary: Plan 45 year old woman with a history of recurrent spells. I have stated my thoughts about her condition and previous consults. It seems clear to me that she has psychogenic seizures although I remain open to the possibility that there may be some kind of undiagnosed and undiagnosable neurologic condition causing her spells as well. In any case she has no insight into her spells being psychogenic and does not present with any treatable psychiatric symptoms. She states that she has a plan one she leaves the hospital to change her primary treatment from Dr. Melrose Nakayama, a neurologist, to someone whom she says is a Pensions consultant. No indication for any psychiatric treatment here in the hospital. I will sign off at this point. I wish the patient well.  Disposition: Patient does not meet criteria for psychiatric inpatient admission.  Alethia Berthold, MD 06/24/2015 7:24 PM

## 2015-06-24 NOTE — Progress Notes (Signed)
Entered pt room and witnessed apparent seizure activity. Pt arms and legs were extended and stiff, eyes closed, pt shaking, hr 130-150. Episode lasted about 45 sec. After episode ended, pt appeared lethargic, hr 60-70 nsr. Pt was able to answer questions appropriately within 5 mins of episode. rn will continue to monitor and assess.

## 2015-06-24 NOTE — Evaluation (Signed)
Physical Therapy Evaluation Patient Details Name: [name] MRN: [id number] DOB: [birth year] Today's Date: 06/24/2015   History of Present Illness  [name] is a 45 y.o. female with a known history of seizures and possible pseudoseizures who was at the OP clinic. Staff heard a loud thud and found the patient on the floor seizing. Patient was brought to the ED where she has had multiple further seizures. Received 5 mg of Ativan. Patient is on 4 different anticonvulsants. Per patient is compliant with medications. Previously was having multiple seizure a day. With addition of Vimpat is having 2-3 per week. Previous monitoring has been suggestive of nonepileptic seizures per records. Pt reports at least 20-30 falls in the last 12 months related to seizures/pseudoseizure vs balance  Clinical Impression  Pt demonstrates fair strength with bed mobility and transfers. Gait is slow and somewhat unsteady but corrected with rolling walker. +2 present with chair follow due to history of seizures/pseudoseizures. During ambulation pt reports progressive fatigue and becomes very lethargic. Pt starts to complain of headache and nausea so seated in recliner. Pt becomes slow to respond to questions but with additional stimulation is able to converse with therapist. No staring episodes or convulsions noted. Pt never loses consiousness and vitals remain WNL. Pt returned to room reporting increased nausea. RN notified of nausea event as well as delayed responsiveness. Patient assisted back into bed and session ended. Unclear etiology of symptoms as they come on slow and pt is able to be aroused with repeated stimulation. By the time she returns to room lethargy has completed resolved and pt is complaining of nausea. Pt will benefit from skilled PT services to address deficits in strength, balance, and mobility in order to return to full function at home. Do not anticipate any PT needs following discharge.       Follow Up Recommendations No PT follow up    Equipment Recommendations  None recommended by PT    Recommendations for Other Services       Precautions / Restrictions Precautions Precautions: Fall;Other (comment) (Seizure) Restrictions Weight Bearing Restrictions: No      Mobility  Bed Mobility Overal bed mobility: Modified Independent             General bed mobility comments: Good speed and sequencing up to EOB  Transfers Overall transfer level: Needs assistance Equipment used: None Transfers: Sit to/from Stand Sit to Stand: Supervision         General transfer comment: Pt demonstrates good LE strength as well as sequencing with transfer. No evidence for gross instability in static standing  Ambulation/Gait Ambulation/Gait assistance: Min guard Ambulation Distance (Feet): 150 Feet Assistive device: Rolling walker (2 wheeled) Gait Pattern/deviations: Step-through pattern Gait velocity: Decreased Gait velocity interpretation: <1.8 ft/sec, indicative of risk for recurrent falls General Gait Details: Pt with shortened step length bilaterally and slow gait speed. Gait speed marginally improves throughout distance but is still very slow. Functional for household mobility. Pt presents with some unsteadiness but corrected with rolling walker. Vitals monitored and SaO2>95% on room air throughout ambulation. HR in the 80s. Pt reports progressive fatigue and becomes very lethargic. Pt starts to complain of headache and nausea so seated in recliner that was following. Pt becomes slow to respond to questions but with additional stimulation is able to converse with therapist. No staring episodes or convulsions noted. Pt never loses consiousness and vitals remain WNL. Pt returned to room reporting increased nausea. RN notified of nausea event as well as delayed responsiveness.  Patient assisted back into bed  Stairs            Wheelchair Mobility    Modified Rankin  (Stroke Patients Only)       Balance Overall balance assessment: Needs assistance Sitting-balance support: No upper extremity supported Sitting balance-Leahy Scale: Good     Standing balance support: No upper extremity supported Standing balance-Leahy Scale: Fair Standing balance comment: Fair balance in wide stance. Pt somewhat unsteady with gait but full balance screening deferred due to decline in alertness as well as nausea at end of session                             Pertinent Vitals/Pain Pain Assessment: 0-10 Pain Score: 10-Worst pain ever Pain Location: L flank Pain Descriptors / Indicators: Sharp Pain Intervention(s): Monitored during session    Home Living Family/patient expects to be discharged to:: Private residence Living Arrangements: Spouse/significant other Available Help at Discharge: Family Type of Home: Mobile home Home Access: Stairs to enter Entrance Stairs-Rails: Can reach both Entrance Stairs-Number of Steps: 3 Home Layout: One level Home Equipment: Walker - 2 wheels;Other (comment);Shower seat;Grab bars - tub/shower (tricane)      Prior Function Level of Independence: Independent with assistive device(s)         Comments: uses tri cane most of the time, household ambulator     Hand Dominance   Dominant Hand: Right    Extremity/Trunk Assessment   Upper Extremity Assessment: Overall WFL for tasks assessed (Poor effort)           Lower Extremity Assessment: Overall WFL for tasks assessed (Poor effort)         Communication   Communication: No difficulties  Cognition Arousal/Alertness: Awake/alert Behavior During Therapy: WFL for tasks assessed/performed Overall Cognitive Status: Within Functional Limits for tasks assessed                      General Comments      Exercises        Assessment/Plan    PT Assessment Patient needs continued PT services  PT Diagnosis Difficulty walking;Abnormality of  gait;Generalized weakness   PT Problem List Decreased strength;Decreased activity tolerance;Decreased balance;Decreased mobility;Decreased knowledge of use of DME;Decreased safety awareness  PT Treatment Interventions DME instruction;Gait training;Stair training;Therapeutic activities;Therapeutic exercise;Balance training;Neuromuscular re-education;Patient/family education   PT Goals (Current goals can be found in the Care Plan section) Acute Rehab PT Goals Patient Stated Goal: Pt does not provide due to nausea at end of session    Frequency Min 2X/week   Barriers to discharge        Co-evaluation               End of Session Equipment Utilized During Treatment: Gait belt Activity Tolerance: Patient limited by lethargy Patient left: in bed;with call bell/phone within reach;with bed alarm set Nurse Communication: Other (comment) (Transiet decline in responsiveness, nausea)         Time: [id number] PT Time Calculation (min) (ACUTE ONLY): 27 min   Charges:   PT Evaluation $PT Eval Low Complexity: 1 Procedure PT Treatments $Gait Training: 8-22 mins   PT G Codes:       Lyndel Safe Huprich PT, DPT   Huprich,Jason 06/24/2015, 4:08 PM

## 2015-06-25 ENCOUNTER — Inpatient Hospital Stay: Payer: Commercial Managed Care - HMO

## 2015-06-25 LAB — GLUCOSE, CAPILLARY
GLUCOSE-CAPILLARY: 90 mg/dL (ref 65–99)
Glucose-Capillary: 91 mg/dL (ref 65–99)

## 2015-06-25 MED ORDER — BARIUM SULFATE 2.1 % PO SUSP
450.0000 mL | ORAL | Status: AC
  Administered 2015-06-25 (×2): 450 mL via ORAL

## 2015-06-25 MED ORDER — DOCUSATE SODIUM 100 MG PO CAPS
100.0000 mg | ORAL_CAPSULE | Freq: Two times a day (BID) | ORAL | Status: DC
  Administered 2015-06-25 – 2015-06-27 (×4): 100 mg via ORAL
  Filled 2015-06-25 (×5): qty 1

## 2015-06-25 MED ORDER — OXYCODONE HCL 5 MG PO TABS
5.0000 mg | ORAL_TABLET | ORAL | Status: DC | PRN
  Administered 2015-06-25 – 2015-06-26 (×4): 5 mg via ORAL
  Filled 2015-06-25 (×4): qty 1

## 2015-06-25 NOTE — Progress Notes (Signed)
Subjective: From review of the chart it appears that the patient has not had any further seizures since 0730 on 06/24/2015.  Tolerating increase in Topamax.  Has multiple other complaints of chest pain and abdominal pain.    Objective: Current vital signs: BP 113/75 mmHg  Pulse 73  Temp(Src) 97.5 F (36.4 C) (Oral)  Resp 18  Ht 5\' 2"  (1.575 m)  Wt 69.854 kg (154 lb)  BMI 28.16 kg/m2  SpO2 99%  LMP  (LMP Unknown) Vital signs in last 24 hours: Temp:  [97.5 F (36.4 C)-99.1 F (37.3 C)] 97.5 F (36.4 C) (04/21 0825) Pulse Rate:  [73-91] 73 (04/21 0825) Resp:  [12-23] 18 (04/21 0825) BP: (93-117)/(59-75) 113/75 mmHg (04/21 0825) SpO2:  [98 %-100 %] 99 % (04/21 0825)  Intake/Output from previous day: 04/20 0701 - 04/21 0700 In: 748.8 [P.O.:240; I.V.:508.8] Out: 475 [Urine:475] Intake/Output this shift: Total I/O In: 240 [P.O.:240] Out: -  Nutritional status: Diet regular Room service appropriate?: Yes; Fluid consistency:: Thin  Neurologic Exam: Mental Status: Awake, alert. Speech fluent without evidence of aphasia. Able to follow 3 step commands without difficulty. Cranial Nerves: II: Discs flat bilaterally; Visual fields grossly normal, pupils equal, round, reactive to light and accommodation III,IV, VI: ptosis not present, extra-ocular motions intact bilaterally V,VII: smile symmetric, facial light touch sensation normal bilaterally VIII: hearing normal bilaterally IX,X: gag reflex present XI: bilateral shoulder shrug XII: midline tongue extension Motor: Right :Upper extremity 5/5Left: Upper extremity 5/5 Lower extremity 5/5Lower extremity 5/5   Lab Results: Basic Metabolic Panel:  Recent Labs Lab 06/22/15 1338 06/24/15 1025  NA 140 143  K 3.8 3.2*  CL 109 112*  CO2 18* 24  GLUCOSE 101* 125*  BUN 15 12  CREATININE 0.76 0.80  CALCIUM 9.4 8.6*     Liver Function Tests: No results for input(s): AST, ALT, ALKPHOS, BILITOT, PROT, ALBUMIN in the last 168 hours. No results for input(s): LIPASE, AMYLASE in the last 168 hours. No results for input(s): AMMONIA in the last 168 hours.  CBC:  Recent Labs Lab 06/22/15 1338 06/24/15 1025  WBC 6.3 4.1  NEUTROABS 4.0  --   HGB 12.5 12.0  HCT 37.0 35.8  MCV 87.4 87.3  PLT 192 177    Cardiac Enzymes: No results for input(s): CKTOTAL, CKMB, CKMBINDEX, TROPONINI in the last 168 hours.  Lipid Panel: No results for input(s): CHOL, TRIG, HDL, CHOLHDL, VLDL, LDLCALC in the last 168 hours.  CBG:  Recent Labs Lab 06/22/15 2211 06/23/15 1957 06/25/15 0837  GLUCAP 94 93 90    Microbiology: Results for orders placed or performed during the hospital encounter of 06/22/15  MRSA PCR Screening     Status: None   Collection Time: 06/22/15 11:04 PM  Result Value Ref Range Status   MRSA by PCR NEGATIVE NEGATIVE Final    Comment:        The GeneXpert MRSA Assay (FDA approved for NASAL specimens only), is one component of a comprehensive MRSA colonization surveillance program. It is not intended to diagnose MRSA infection nor to guide or monitor treatment for MRSA infections.     Coagulation Studies: No results for input(s): LABPROT, INR in the last 72 hours.  Imaging: No results found.  Medications:  I have reviewed the patient's current medications. Scheduled: . aspirin EC  81 mg Oral Daily  . divalproex  500 mg Oral BID  . enoxaparin (LOVENOX) injection  40 mg Subcutaneous Q24H  . FLUoxetine  20 mg Oral Daily  .  gabapentin  300 mg Oral BID  . ibuprofen  600 mg Oral Once  . lacosamide  200 mg Oral BID  . loratadine  10 mg Oral Daily  . mirtazapine  15 mg Oral QHS  . pantoprazole  40 mg Oral Daily  . promethazine  25 mg Oral Once  . topiramate  25 mg Oral Daily   And  . topiramate  50 mg Oral QHS    Assessment/Plan: Patient improved on current dose of  anticonvulsants.  Recommendations: 1.  Continue current AED therapy at current dose with patient to follow up on an outpatient basis with Dr. Melrose Nakayama until she is able to find another outpatient neurologist to manage her care.     LOS: 3 days   [name], MD Neurology [phone] 06/25/2015  10:57 AM

## 2015-06-25 NOTE — Plan of Care (Signed)
Problem: Pain Managment: Goal: General experience of comfort will improve Outcome: Progressing Toradol given for pain. Improvement noted. Tylenol given for pain. Improvement noted. Oxycodone given for pain. Improvement noted.

## 2015-06-25 NOTE — Progress Notes (Signed)
Patient ID: [name], female   DOB: 01/28/1971, 45 y.o.   MRN: [id number] Patient ID: [name], female   DOB: May 13, 1970, 45 y.o.   MRN: [id number] [facility] PROGRESS NOTE  [name] [id number] DOB: December 24, 1970 DOA: 06/22/2015 PCP: Glendon Axe, MD  HPI/Subjective: Patient feels weird. She complains of a headache. She's had a couple more seizures since I saw her yesterday. She has a lot of concerns about seizure versus pseudoseizure.  Objective: Filed Vitals:   06/25/15 0452 06/25/15 0825  BP: 93/59 113/75  Pulse: 84 73  Temp: 97.9 F (36.6 C) 97.5 F (36.4 C)  Resp: 18 18    Filed Weights   06/22/15 1221 06/22/15 1800  Weight: 69.899 kg (154 lb 1.6 oz) 69.854 kg (154 lb)    ROS: Review of Systems  Constitutional: Negative for fever and chills.  Eyes: Negative for blurred vision.  Respiratory: Negative for cough and shortness of breath.   Cardiovascular: Negative for chest pain.  Gastrointestinal: Positive for abdominal pain. Negative for nausea, vomiting, diarrhea and constipation.  Genitourinary: Negative for dysuria.  Musculoskeletal: Negative for joint pain.  Neurological: Positive for seizures. Negative for dizziness and headaches.   Exam: Physical Exam  Constitutional: She is oriented to person, place, and time.  HENT:  Nose: No mucosal edema.  Mouth/Throat: No oropharyngeal exudate or posterior oropharyngeal edema.  Eyes: Conjunctivae, EOM and lids are normal. Pupils are equal, round, and reactive to light.  Neck: No JVD present. Carotid bruit is not present. No edema present. No thyroid mass and no thyromegaly present.  Cardiovascular: S1 normal and S2 normal.  Exam reveals no gallop.   No murmur heard. Pulses:      Dorsalis pedis pulses are 2+ on the right side, and 2+ on the left side.  Respiratory: No respiratory distress. She has no wheezes. She has no rhonchi. She has no rales.  GI: Soft. Bowel sounds are normal. There is  tenderness in the left upper quadrant and left lower quadrant.  Musculoskeletal:       Right ankle: She exhibits no swelling.       Left ankle: She exhibits no swelling.  Lymphadenopathy:    She has no cervical adenopathy.  Neurological: She is alert and oriented to person, place, and time. No cranial nerve deficit.  Skin: Skin is warm. No rash noted. Nails show no clubbing.  Psychiatric: She has a normal mood and affect.      Data Reviewed: Basic Metabolic Panel:  Recent Labs Lab 06/22/15 1338 06/24/15 1025  NA 140 143  K 3.8 3.2*  CL 109 112*  CO2 18* 24  GLUCOSE 101* 125*  BUN 15 12  CREATININE 0.76 0.80  CALCIUM 9.4 8.6*   CBC:  Recent Labs Lab 06/22/15 1338 06/24/15 1025  WBC 6.3 4.1  NEUTROABS 4.0  --   HGB 12.5 12.0  HCT 37.0 35.8  MCV 87.4 87.3  PLT 192 177    CBG:  Recent Labs Lab 06/22/15 2211 06/23/15 1957 06/25/15 0837 06/25/15 1125  GLUCAP 94 93 90 91    Recent Results (from the past 240 hour(s))  MRSA PCR Screening     Status: None   Collection Time: 06/22/15 11:04 PM  Result Value Ref Range Status   MRSA by PCR NEGATIVE NEGATIVE Final    Comment:        The GeneXpert MRSA Assay (FDA approved for NASAL specimens only), is one component of a comprehensive MRSA colonization surveillance program. It is not  intended to diagnose MRSA infection nor to guide or monitor treatment for MRSA infections.       Scheduled Meds: . aspirin EC  81 mg Oral Daily  . divalproex  500 mg Oral BID  . docusate sodium  100 mg Oral BID  . enoxaparin (LOVENOX) injection  40 mg Subcutaneous Q24H  . FLUoxetine  20 mg Oral Daily  . gabapentin  300 mg Oral BID  . ibuprofen  600 mg Oral Once  . lacosamide  200 mg Oral BID  . loratadine  10 mg Oral Daily  . mirtazapine  15 mg Oral QHS  . pantoprazole  40 mg Oral Daily  . promethazine  25 mg Oral Once  . topiramate  25 mg Oral Daily   And  . topiramate  50 mg Oral QHS   Assessment/Plan:  1. New  abdominal pain- CT abdomen and pelvis ordered  2. Multiple seizures. Medications as per neurology.  Neurology happy with less frequent seizure-like activity. Goal is to try to decrease the frequency of the seizures prior to disposition. Will need tertiary care referral as outpatient. As per Dr. Doy Mince, seizures are less today and can be discharged home at any point from a seizure standpoint. 3. Anxiety depression on fluoxetine 4. Gastroesophageal reflux disease without esophagitis on PPI 5. History of stroke on aspirin  Code Status:     Code Status Orders        Start     Ordered   06/22/15 1815  Full code   Continuous     06/22/15 1814    Code Status History    Date Active Date Inactive Code Status Order ID Comments User Context   05/10/2015 11:02 PM 05/12/2015  2:45 PM Full Code [id number]  Vaughan Basta, MD Inpatient   11/03/2014 11:20 PM 11/04/2014  7:18 PM Full Code [id number]  Lytle Butte, MD ED    Advance Directive Documentation        Most Recent Value   Type of Advance Directive  Healthcare Power of [place], Living will [POA: [name]]   Pre-existing out of facility DNR order (yellow form or pink MOST form)     "MOST" Form in Place?       Disposition Plan: Home soon but now has abdominal pain   Consultants:  Neurology  Time spent: 24 minutes  [place], [place]

## 2015-06-25 NOTE — Care Management Important Message (Signed)
Important Message  Patient Details  Name: [name] MRN: [id number] Date of Birth: [birth year]   Medicare Important Message Given:  Yes    Juliann Pulse A Coutney Wildermuth 06/25/2015, 10:08 AM

## 2015-06-26 DIAGNOSIS — M25512 Pain in left shoulder: Secondary | ICD-10-CM

## 2015-06-26 DIAGNOSIS — G8929 Other chronic pain: Secondary | ICD-10-CM | POA: Insufficient documentation

## 2015-06-26 DIAGNOSIS — M25511 Pain in right shoulder: Secondary | ICD-10-CM

## 2015-06-26 DIAGNOSIS — M47812 Spondylosis without myelopathy or radiculopathy, cervical region: Secondary | ICD-10-CM | POA: Insufficient documentation

## 2015-06-26 LAB — URINALYSIS COMPLETE WITH MICROSCOPIC (ARMC ONLY)
Bilirubin Urine: NEGATIVE
Glucose, UA: NEGATIVE mg/dL
Hgb urine dipstick: NEGATIVE
Leukocytes, UA: NEGATIVE
NITRITE: NEGATIVE
PH: 6 (ref 5.0–8.0)
PROTEIN: NEGATIVE mg/dL
SPECIFIC GRAVITY, URINE: 1.012 (ref 1.005–1.030)

## 2015-06-26 MED ORDER — OXYCODONE HCL 5 MG PO TABS
5.0000 mg | ORAL_TABLET | Freq: Four times a day (QID) | ORAL | Status: DC | PRN
  Administered 2015-06-26 – 2015-06-27 (×2): 5 mg via ORAL
  Filled 2015-06-26 (×2): qty 1

## 2015-06-26 NOTE — Progress Notes (Signed)
[facility] [place] NAME: [name]    MRN#:  [id number]  DATE OF BIRTH:  [birth year]  SUBJECTIVE:  Hospital Day: 4 days [name] is a 45 y.o. female presenting with Seizures .   Overnight events: No overnight events Interval Events: Continued abdominal pain described as left flank pain radiating across the abdomen  REVIEW OF SYSTEMS:  CONSTITUTIONAL: No fever, fatigue or weakness.  EYES: No blurred or double vision.  EARS, NOSE, AND THROAT: No tinnitus or ear pain.  RESPIRATORY: No cough, shortness of breath, wheezing or hemoptysis.  CARDIOVASCULAR: No chest pain, orthopnea, edema.  GASTROINTESTINAL: No nausea, vomiting, diarrhea positive abdominal pain.  GENITOURINARY: No dysuria, hematuria.  ENDOCRINE: No polyuria, nocturia,  HEMATOLOGY: No anemia, easy bruising or bleeding SKIN: No rash or lesion. MUSCULOSKELETAL: No joint pain or arthritis.   NEUROLOGIC: No tingling, numbness, weakness.  PSYCHIATRY: No anxiety or depression.   DRUG ALLERGIES:   Allergies  Allergen Reactions  . Bee Venom Anaphylaxis  . Shellfish Allergy Anaphylaxis  . Contrast Media [Iodinated Diagnostic Agents] Other (See Comments)    Reaction:  Unknown   . Penicillins Other (See Comments)    Reaction:  Seizures  Has patient had a PCN reaction causing immediate rash, facial/tongue/throat swelling, SOB or lightheadedness with hypotension: No Has patient had a PCN reaction causing severe rash involving mucus membranes or skin necrosis: No Has patient had a PCN reaction that required hospitalization Yes Has patient had a PCN reaction occurring within the last 10 years: Yes If all of the above answers are "NO", then may proceed with Cephalosporin use.  . Sulfa Antibiotics Itching    VITALS:  Blood pressure 95/59, pulse 75, temperature 98.3 F (36.8 C), temperature source Oral, resp. rate 20, height 5\' 2"  (1.575 m), weight 69.854 kg  (154 lb), SpO2 99 %.  PHYSICAL EXAMINATION:  VITAL SIGNS: Filed Vitals:   06/25/15 2113 06/26/15 0453  BP: 111/73 95/59  Pulse: 106 75  Temp: 98.3 F (36.8 C)   Resp:  20   GENERAL:45 y.o.female currently in no acute distress.  HEAD: Normocephalic, atraumatic.  EYES: Pupils equal, round, reactive to light. Extraocular muscles intact. No scleral icterus.  MOUTH: Moist mucosal membrane. Dentition intact. No abscess noted.  EAR, NOSE, THROAT: Clear without exudates. No external lesions.  NECK: Supple. No thyromegaly. No nodules. No JVD.  PULMONARY: Clear to ascultation, without wheeze rails or rhonci. No use of accessory muscles, Good respiratory effort. good air entry bilaterally CHEST: Nontender to palpation.  CARDIOVASCULAR: S1 and S2. Regular rate and rhythm. No murmurs, rubs, or gallops. No edema. Pedal pulses 2+ bilaterally.  GASTROINTESTINAL: Soft, nontender, nondistended. No masses. Positive bowel sounds. No hepatosplenomegaly.  MUSCULOSKELETAL: No swelling, clubbing, or edema. Range of motion full in all extremities.  NEUROLOGIC: Cranial nerves II through XII are intact. No gross focal neurological deficits. Sensation intact. Reflexes intact.  SKIN: No ulceration, lesions, rashes, or cyanosis. Skin warm and dry. Turgor intact.  PSYCHIATRIC: Mood, affect within normal limits. The patient is awake, alert and oriented x 3. Insight, judgment intact.      LABORATORY PANEL:   CBC  Recent Labs Lab 06/24/15 1025  WBC 4.1  HGB 12.0  HCT 35.8  PLT 177   ------------------------------------------------------------------------------------------------------------------  Chemistries   Recent Labs Lab 06/24/15 1025  NA 143  K 3.2*  CL 112*  CO2 24  GLUCOSE 125*  BUN 12  CREATININE 0.80  CALCIUM 8.6*   ------------------------------------------------------------------------------------------------------------------  Cardiac Enzymes No results for input(s): TROPONINI  in the last 168 hours. ------------------------------------------------------------------------------------------------------------------  RADIOLOGY:  Ct Abdomen Pelvis Wo Contrast  06/25/2015  CLINICAL DATA:  New onset abdominal pain on LEFT side with nausea, vomiting and diarrhea, personal history of COPD, asthma, stroke, smoker EXAM: CT ABDOMEN AND PELVIS WITHOUT CONTRAST TECHNIQUE: Multidetector CT imaging of the abdomen and pelvis was performed following the standard protocol without IV contrast. Sagittal and coronal MPR images reconstructed from axial data set. Patient drank dilute oral contrast for exam. COMPARISON:  05/10/2015 FINDINGS: Minimal pleural fluid dependently at both lung bases. Minimal pericardial fluid. Splenic cyst with calcified rim at anterior superior spleen 3.6 x 2.7 x 3.7 cm unchanged. Nonobstructing 3 mm calculus upper pole LEFT kidney. Liver, spleen, pancreas, kidneys, and adrenal glands otherwise normal. Contracted gallbladder. Normal appendix. Stomach and bowel loops normal appearance for technique. Uterus surgically absent with normal sized ovaries. Bladder and ureters unremarkable. Scattered pelvic phleboliths. No mass, adenopathy, free air, free fluid, or inflammatory process. Bones unremarkable. IMPRESSION: Stable calcified splenic cyst. Nonobstructing 3 mm LEFT renal calculus. No acute intra-abdominal or intrapelvic abnormalities. Electronically Signed   By: Lavonia Dana M.D.   On: 06/25/2015 17:10    EKG:   Orders placed or performed during the hospital encounter of 05/10/15  . EKG 12-Lead  . EKG 12-Lead  . EKG    ASSESSMENT AND PLAN:   [name] is a 45 y.o. female presenting with Seizures . Admitted 06/22/2015 : Day #: 4 days 1. abdominal pain: Patient extremely hesitant to leave hospital given current symptoms will check urinalysis there is evidence of 3 mm stone with obstruction possible renal colic as etiology check UA for red blood  cells 2. Multiple seizures. Medications as per neurology. Neurology happy with less frequent seizure-like activity. Goal is to try to decrease the frequency of the seizures prior to disposition. Will need tertiary care referral as outpatient. As per Dr. Doy Mince, seizures are less today and can be discharged home at any point from a seizure standpoint. 3. Anxiety depression on fluoxetine 4. Gastroesophageal reflux disease without esophagitis on PPI 5. History of stroke on aspirin   All the records are reviewed and case discussed with Care Management/Social Workerr. Management plans discussed with the patient, family and they are in agreement.  CODE STATUS: full TOTAL TIME TAKING CARE OF THIS PATIENT: 28 minutes.   POSSIBLE D/C IN 1DAYS, DEPENDING ON CLINICAL CONDITION.   Braxtin Bamba,  Karenann Cai.D on 06/26/2015 at 11:14 AM  Between 7am to 6pm - Pager - [phone]  After 6pm: House Pager: - [phone]  [facility]  Office  [phone]  CC: Primary care physician; Glendon Axe, MD

## 2015-06-27 LAB — CBC
HCT: 34.7 % — ABNORMAL LOW (ref 35.0–47.0)
HEMOGLOBIN: 12.1 g/dL (ref 12.0–16.0)
MCH: 30.1 pg (ref 26.0–34.0)
MCHC: 34.9 g/dL (ref 32.0–36.0)
MCV: 86.3 fL (ref 80.0–100.0)
Platelets: 205 10*3/uL (ref 150–440)
RBC: 4.02 MIL/uL (ref 3.80–5.20)
RDW: 12.7 % (ref 11.5–14.5)
WBC: 4.5 10*3/uL (ref 3.6–11.0)

## 2015-06-27 LAB — CREATININE, SERUM
CREATININE: 0.8 mg/dL (ref 0.44–1.00)
GFR calc Af Amer: >60 mL/min (ref >60)
GFR calc non Af Amer: >60 mL/min (ref >60)

## 2015-06-27 MED ORDER — KETOROLAC TROMETHAMINE 10 MG PO TABS
10.0000 mg | ORAL_TABLET | Freq: Three times a day (TID) | ORAL | Status: DC | PRN
Start: 2015-06-27 — End: 2015-10-28

## 2015-06-27 MED ORDER — TOPIRAMATE 25 MG PO TABS: 25.0000 mg | ORAL_TABLET | Freq: Every day | ORAL | Status: DC

## 2015-06-27 MED ORDER — TOPIRAMATE 25 MG PO TABS: 50.0000 mg | ORAL_TABLET | Freq: Every day | ORAL | Status: DC

## 2015-06-27 MED ORDER — OXYCODONE HCL 5 MG PO TABS: 5.0000 mg | ORAL_TABLET | Freq: Four times a day (QID) | ORAL | Status: DC | PRN

## 2015-06-27 NOTE — Progress Notes (Signed)
Pt is being discharged. Discharge papers given and explained to pt. Follow up appointments and meds reviewed with pt. RX given. Pending arrival of spouse.

## 2015-06-27 NOTE — Discharge Summary (Signed)
[name] NAME: [name]    MR#:  [id number]  DATE OF BIRTH:  [birth year]  DATE OF ADMISSION:  06/22/2015 ADMITTING PHYSICIAN: Max Sane, MD  DATE OF DISCHARGE: 06/27/2015  PRIMARY CARE PHYSICIAN: Singh,Jasmine, MD    ADMISSION DIAGNOSIS:  Seizure ([place]) [R56.9] Pseudoseizure ([place]) [F44.5]   DISCHARGE DIAGNOSIS:  Active Problems:   Seizure ([place])   Pseudoseizures    SECONDARY DIAGNOSIS:   Past Medical History  Diagnosis Date  . Anxiety   . Arthritis   . Asthma   . Migraines   . Depression   . Emphysema/COPD ([place])   . Sleep apnea   . Stroke ([place])   . Seizure [facility])     HOSPITAL COURSE:  [name]  is a 45 y.o. female admitted 06/22/2015 with chief complaint Seizures . Please see H&P performed by Max Sane, MD for further information. Patient has a history of seizure disorder which was complicated by pseudoseizures who presented with increased frequency of seizures as stated above. Neurology assisted during her hospital stay and titration of her medication. Her seizure frequency has decreased significantly. Her course was somewhat complicated intermittent abdominal pain. All testing including CAT scan negative for etiology of abdominal pain this is likely secondary to side effect of her seizure medication as her symptoms have been gradually improving. She is to follow with outpatient neurology for further titration  DISCHARGE CONDITIONS:   Stable  CONSULTS OBTAINED:  Treatment Team:  Catarina Hartshorn, MD Gonzella Lex, MD [name], MD  DRUG ALLERGIES:   Allergies  Allergen Reactions  . Bee Venom Anaphylaxis  . Shellfish Allergy Anaphylaxis  . Contrast Media [Iodinated Diagnostic Agents] Other (See Comments)    Reaction:  Unknown   . Penicillins Other (See Comments)    Reaction:  Seizures  Has patient had a PCN reaction causing immediate rash, facial/tongue/throat swelling, SOB or  lightheadedness with hypotension: No Has patient had a PCN reaction causing severe rash involving mucus membranes or skin necrosis: No Has patient had a PCN reaction that required hospitalization Yes Has patient had a PCN reaction occurring within the last 10 years: Yes If all of the above answers are "NO", then may proceed with Cephalosporin use.  . Sulfa Antibiotics Itching    DISCHARGE MEDICATIONS:   Current Discharge Medication List    START taking these medications   Details  ketorolac (TORADOL) 10 MG tablet Take 1 tablet (10 mg total) by mouth every 8 (eight) hours as needed for moderate pain or severe pain. Qty: 20 tablet, Refills: 0    oxyCODONE (OXY IR/ROXICODONE) 5 MG immediate release tablet Take 1 tablet (5 mg total) by mouth every 6 (six) hours as needed for moderate pain or severe pain. Qty: 30 tablet, Refills: 0      CONTINUE these medications which have CHANGED   Details  !! topiramate (TOPAMAX) 25 MG tablet Take 2 tablets (50 mg total) by mouth at bedtime. Qty: 60 tablet, Refills: 0    !! topiramate (TOPAMAX) 25 MG tablet Take 1 tablet (25 mg total) by mouth daily. Qty: 30 tablet, Refills: 0     !! - Potential duplicate medications found. Please discuss with provider.    CONTINUE these medications which have NOT CHANGED   Details  acetaminophen (TYLENOL) 500 MG tablet Take 1,000 mg by mouth every 6 (six) hours as needed for mild pain or headache.    albuterol (PROVENTIL HFA;VENTOLIN HFA) 108 (90 BASE)  MCG/ACT inhaler Inhale 2 puffs into the lungs every 6 (six) hours as needed for wheezing or shortness of breath.     albuterol (PROVENTIL) (2.5 MG/3ML) 0.083% nebulizer solution Take 2.5 mg by nebulization every 6 (six) hours as needed for wheezing or shortness of breath. Reported on 05/10/2015    aspirin EC 81 MG tablet Take 81 mg by mouth daily.    divalproex (DEPAKOTE) 500 MG DR tablet Take 500 mg by mouth 2 (two) times daily.    EPINEPHrine (EPIPEN 2-PAK)  0.3 mg/0.3 mL IJ SOAJ injection Inject 0.3 mg into the muscle once as needed (for severe allergic reaction).    FLUoxetine (PROZAC) 20 MG capsule Take 20 mg by mouth daily.    gabapentin (NEURONTIN) 300 MG capsule Take 300 mg by mouth 2 (two) times daily.    lacosamide (VIMPAT) 200 MG TABS tablet Take 1 tablet (200 mg total) by mouth 2 (two) times daily. Qty: 60 tablet, Refills: 0    loratadine (CLARITIN) 10 MG tablet Take 10 mg by mouth daily.    meloxicam (MOBIC) 7.5 MG tablet Take 7.5-15 mg by mouth daily as needed for pain.     mirtazapine (REMERON) 15 MG tablet Take 15 mg by mouth at bedtime.    pantoprazole (PROTONIX) 40 MG tablet Take 40 mg by mouth daily.    promethazine (PHENERGAN) 25 MG tablet Take 25 mg by mouth every 6 (six) hours as needed for nausea or vomiting.         DISCHARGE INSTRUCTIONS:    DIET:  Regular diet  DISCHARGE CONDITION:  Stable  ACTIVITY:  Activity as tolerated  OXYGEN:  Home Oxygen: No.   Oxygen Delivery: room air  DISCHARGE LOCATION:  home   If you experience worsening of your admission symptoms, develop shortness of breath, life threatening emergency, suicidal or homicidal thoughts you must seek medical attention immediately by calling 911 or calling your MD immediately  if symptoms less severe.  You Must read complete instructions/literature along with all the possible adverse reactions/side effects for all the Medicines you take and that have been prescribed to you. Take any new Medicines after you have completely understood and accpet all the possible adverse reactions/side effects.   Please note  You were cared for by a hospitalist during your hospital stay. If you have any questions about your discharge medications or the care you received while you were in the hospital after you are discharged, you can call the unit and asked to speak with the hospitalist on call if the hospitalist that took care of you is not available. Once  you are discharged, your primary care physician will handle any further medical issues. Please note that NO REFILLS for any discharge medications will be authorized once you are discharged, as it is imperative that you return to your primary care physician (or establish a relationship with a primary care physician if you do not have one) for your aftercare needs so that they can reassess your need for medications and monitor your lab values.    On the day of Discharge:   VITAL SIGNS:  Blood pressure 101/54, pulse 85, temperature 97.6 F (36.4 C), temperature source Oral, resp. rate 20, height 5\' 2"  (1.575 m), weight 69.854 kg (154 lb), SpO2 98 %.  I/O:   Intake/Output Summary (Last 24 hours) at 06/27/15 0914 Last data filed at 06/26/15 1700  Gross per 24 hour  Intake    480 ml  Output  0 ml  Net    480 ml    PHYSICAL EXAMINATION:  GENERAL:  45 y.o.-year-old patient lying in the bed with no acute distress.  EYES: Pupils equal, round, reactive to light and accommodation. No scleral icterus. Extraocular muscles intact.  HEENT: Head atraumatic, normocephalic. Oropharynx and nasopharynx clear.  NECK:  Supple, no jugular venous distention. No thyroid enlargement, no tenderness.  LUNGS: Normal breath sounds bilaterally, no wheezing, rales,rhonchi or crepitation. No use of accessory muscles of respiration.  CARDIOVASCULAR: S1, S2 normal. No murmurs, rubs, or gallops.  ABDOMEN: Soft, non-tender, non-distended. Bowel sounds present. No organomegaly or mass.  EXTREMITIES: No pedal edema, cyanosis, or clubbing.  NEUROLOGIC: Cranial nerves II through XII are intact. Muscle strength 5/5 in all extremities. Sensation intact. Gait not checked.  PSYCHIATRIC: The patient is alert and oriented x 3.  SKIN: No obvious rash, lesion, or ulcer.   DATA REVIEW:   CBC  Recent Labs Lab 06/27/15 0532  WBC 4.5  HGB 12.1  HCT 34.7*  PLT 205    Chemistries   Recent Labs Lab 06/24/15 1025  06/27/15 0532  NA 143  --   K 3.2*  --   CL 112*  --   CO2 24  --   GLUCOSE 125*  --   BUN 12  --   CREATININE 0.80 0.80  CALCIUM 8.6*  --     Cardiac Enzymes No results for input(s): TROPONINI in the last 168 hours.  Microbiology Results  Results for orders placed or performed during the hospital encounter of 06/22/15  MRSA PCR Screening     Status: None   Collection Time: 06/22/15 11:04 PM  Result Value Ref Range Status   MRSA by PCR NEGATIVE NEGATIVE Final    Comment:        The GeneXpert MRSA Assay (FDA approved for NASAL specimens only), is one component of a comprehensive MRSA colonization surveillance program. It is not intended to diagnose MRSA infection nor to guide or monitor treatment for MRSA infections.     RADIOLOGY:  Ct Abdomen Pelvis Wo Contrast  06/25/2015  CLINICAL DATA:  New onset abdominal pain on LEFT side with nausea, vomiting and diarrhea, personal history of COPD, asthma, stroke, smoker EXAM: CT ABDOMEN AND PELVIS WITHOUT CONTRAST TECHNIQUE: Multidetector CT imaging of the abdomen and pelvis was performed following the standard protocol without IV contrast. Sagittal and coronal MPR images reconstructed from axial data set. Patient drank dilute oral contrast for exam. COMPARISON:  05/10/2015 FINDINGS: Minimal pleural fluid dependently at both lung bases. Minimal pericardial fluid. Splenic cyst with calcified rim at anterior superior spleen 3.6 x 2.7 x 3.7 cm unchanged. Nonobstructing 3 mm calculus upper pole LEFT kidney. Liver, spleen, pancreas, kidneys, and adrenal glands otherwise normal. Contracted gallbladder. Normal appendix. Stomach and bowel loops normal appearance for technique. Uterus surgically absent with normal sized ovaries. Bladder and ureters unremarkable. Scattered pelvic phleboliths. No mass, adenopathy, free air, free fluid, or inflammatory process. Bones unremarkable. IMPRESSION: Stable calcified splenic cyst. Nonobstructing 3 mm LEFT  renal calculus. No acute intra-abdominal or intrapelvic abnormalities. Electronically Signed   By: Lavonia Dana M.D.   On: 06/25/2015 17:10     Management plans discussed with the patient, family and they are in agreement.  CODE STATUS:     Code Status Orders        Start     Ordered   06/22/15 1815  Full code   Continuous     06/22/15 1814  Code Status History    Date Active Date Inactive Code Status Order ID Comments User Context   05/10/2015 11:02 PM 05/12/2015  2:45 PM Full Code [id number]  Vaughan Basta, MD Inpatient   11/03/2014 11:20 PM 11/04/2014  7:18 PM Full Code [id number]  Lytle Butte, MD ED    Advance Directive Documentation        Most Recent Value   Type of Advance Directive  Healthcare Power of [place], Living will [POA: [facility] [place]]   Pre-existing out of facility DNR order (yellow form or pink MOST form)     "MOST" Form in Place?        TOTAL TIME TAKING CARE OF THIS PATIENT: 28 minutes.    Ellison Leisure,  Karenann Cai.D on 06/27/2015 at 9:14 AM  Between 7am to 6pm - Pager - [phone]  After 6pm go to [email or web address] - Proofreader  [facility]  Office  [phone]  CC: Primary care physician; Glendon Axe, MD

## 2015-06-27 NOTE — Progress Notes (Signed)
Subjective: Patient without further events overnight.  Continues to complain of pain but reports that she is ready to go home.    Objective: Current vital signs: BP 101/54 mmHg  Pulse 85  Temp(Src) 97.6 F (36.4 C) (Oral)  Resp 20  Ht 5\' 2"  (1.575 m)  Wt 69.854 kg (154 lb)  BMI 28.16 kg/m2  SpO2 98%  LMP  (LMP Unknown) Vital signs in last 24 hours: Temp:  [97.6 F (36.4 C)-98.6 F (37 C)] 97.6 F (36.4 C) (04/23 0452) Pulse Rate:  [85-99] 85 (04/23 0452) Resp:  [18-20] 20 (04/23 0452) BP: (101-111)/(54-73) 101/54 mmHg (04/23 0452) SpO2:  [96 %-100 %] 98 % (04/23 0452)  Intake/Output from previous day: 04/22 0701 - 04/23 0700 In: 480 [P.O.:480] Out: -  Intake/Output this shift: Total I/O In: 240 [P.O.:240] Out: -  Nutritional status: Diet regular Room service appropriate?: Yes; Fluid consistency:: Thin  Neurologic Exam: Mental Status: Awake, alert. Speech fluent without evidence of aphasia. Able to follow 3 step commands without difficulty. Cranial Nerves: II: Discs flat bilaterally; Visual fields grossly normal, pupils equal, round, reactive to light and accommodation III,IV, VI: ptosis not present, extra-ocular motions intact bilaterally V,VII: smile symmetric, facial light touch sensation normal bilaterally VIII: hearing normal bilaterally IX,X: gag reflex present XI: bilateral shoulder shrug XII: midline tongue extension Motor: Right :Upper extremity 5/5Left: Upper extremity 5/5 Lower extremity 5/5Lower extremity 5/5  Lab Results: Basic Metabolic Panel:  Recent Labs Lab 06/22/15 1338 06/24/15 1025 06/27/15 0532  NA 140 143  --   K 3.8 3.2*  --   CL 109 112*  --   CO2 18* 24  --   GLUCOSE 101* 125*  --   BUN 15 12  --   CREATININE 0.76 0.80 0.80  CALCIUM 9.4 8.6*  --     Liver Function Tests: No results for input(s): AST, ALT,  ALKPHOS, BILITOT, PROT, ALBUMIN in the last 168 hours. No results for input(s): LIPASE, AMYLASE in the last 168 hours. No results for input(s): AMMONIA in the last 168 hours.  CBC:  Recent Labs Lab 06/22/15 1338 06/24/15 1025 06/27/15 0532  WBC 6.3 4.1 4.5  NEUTROABS 4.0  --   --   HGB 12.5 12.0 12.1  HCT 37.0 35.8 34.7*  MCV 87.4 87.3 86.3  PLT 192 177 205    Cardiac Enzymes: No results for input(s): CKTOTAL, CKMB, CKMBINDEX, TROPONINI in the last 168 hours.  Lipid Panel: No results for input(s): CHOL, TRIG, HDL, CHOLHDL, VLDL, LDLCALC in the last 168 hours.  CBG:  Recent Labs Lab 06/22/15 2211 06/23/15 1957 06/25/15 0837 06/25/15 1125  GLUCAP 94 93 90 91    Microbiology: Results for orders placed or performed during the hospital encounter of 06/22/15  MRSA PCR Screening     Status: None   Collection Time: 06/22/15 11:04 PM  Result Value Ref Range Status   MRSA by PCR NEGATIVE NEGATIVE Final    Comment:        The GeneXpert MRSA Assay (FDA approved for NASAL specimens only), is one component of a comprehensive MRSA colonization surveillance program. It is not intended to diagnose MRSA infection nor to guide or monitor treatment for MRSA infections.     Coagulation Studies: No results for input(s): LABPROT, INR in the last 72 hours.  Imaging: Ct Abdomen Pelvis Wo Contrast  06/25/2015  CLINICAL DATA:  New onset abdominal pain on LEFT side with nausea, vomiting and diarrhea, personal history of COPD, asthma, stroke, smoker  EXAM: CT ABDOMEN AND PELVIS WITHOUT CONTRAST TECHNIQUE: Multidetector CT imaging of the abdomen and pelvis was performed following the standard protocol without IV contrast. Sagittal and coronal MPR images reconstructed from axial data set. Patient drank dilute oral contrast for exam. COMPARISON:  05/10/2015 FINDINGS: Minimal pleural fluid dependently at both lung bases. Minimal pericardial fluid. Splenic cyst with calcified rim at anterior  superior spleen 3.6 x 2.7 x 3.7 cm unchanged. Nonobstructing 3 mm calculus upper pole LEFT kidney. Liver, spleen, pancreas, kidneys, and adrenal glands otherwise normal. Contracted gallbladder. Normal appendix. Stomach and bowel loops normal appearance for technique. Uterus surgically absent with normal sized ovaries. Bladder and ureters unremarkable. Scattered pelvic phleboliths. No mass, adenopathy, free air, free fluid, or inflammatory process. Bones unremarkable. IMPRESSION: Stable calcified splenic cyst. Nonobstructing 3 mm LEFT renal calculus. No acute intra-abdominal or intrapelvic abnormalities. Electronically Signed   By: Lavonia Dana M.D.   On: 06/25/2015 17:10    Medications:  I have reviewed the patient's current medications. Scheduled: . aspirin EC  81 mg Oral Daily  . divalproex  500 mg Oral BID  . docusate sodium  100 mg Oral BID  . enoxaparin (LOVENOX) injection  40 mg Subcutaneous Q24H  . FLUoxetine  20 mg Oral Daily  . gabapentin  300 mg Oral BID  . ibuprofen  600 mg Oral Once  . lacosamide  200 mg Oral BID  . loratadine  10 mg Oral Daily  . mirtazapine  15 mg Oral QHS  . pantoprazole  40 mg Oral Daily  . promethazine  25 mg Oral Once  . topiramate  25 mg Oral Daily   And  . topiramate  50 mg Oral QHS    Assessment/Plan: Patient stable.  No further events.    Recommendations: 1.  Continue anticonvulsant therapy at current doses.  Patient to follow up with neurology as an outpatient.  Current patient of Dr. Melrose Nakayama.     LOS: 5 days   [name], MD Neurology [phone] 06/27/2015  10:27 AM

## 2015-07-14 DIAGNOSIS — K219 Gastro-esophageal reflux disease without esophagitis: Secondary | ICD-10-CM | POA: Diagnosis not present

## 2015-07-14 DIAGNOSIS — R232 Flushing: Secondary | ICD-10-CM | POA: Diagnosis not present

## 2015-07-14 DIAGNOSIS — R1084 Generalized abdominal pain: Secondary | ICD-10-CM | POA: Diagnosis not present

## 2015-07-14 DIAGNOSIS — R6889 Other general symptoms and signs: Secondary | ICD-10-CM | POA: Diagnosis not present

## 2015-07-14 DIAGNOSIS — M150 Primary generalized (osteo)arthritis: Secondary | ICD-10-CM | POA: Diagnosis not present

## 2015-07-22 ENCOUNTER — Other Ambulatory Visit: Payer: Self-pay

## 2015-07-22 DIAGNOSIS — R569 Unspecified convulsions: Secondary | ICD-10-CM

## 2015-07-22 NOTE — Patient Outreach (Addendum)
[facility]) Care Management  07/22/2015  [name] 1971/01/11 [id number]   Telephone Screen  Referral Date: 07/21/15 Referral Source: [employer] Referral Reason: "falls with seizures, not a lot of support from family/friends, h/o COPD, stroke, seizures"   Outreach attempt #1 to patient. Patient reached. Screening completed.  Social: Patient resides in her home. She lives with spouse who is her caregiver. However, spouse work out of town during the week and is only home on weekends. She states that they do not have any family or friends nearby as they all reside in [place]. She is currently using [employer] transportation but needs alternate solution as she will exhaust their services soon. Patient states she is able to perform her ADLs but only able to do very light and minimal household duties. She states she has had falls i the home. However, she is unable to recall how how many or when last fall occurred. DME in the home include cane and walker. She also voices some financial difficulty in regards to her bills and groceries.,  Conditions: Patient has h/o seizures, anxiety,depression, stroke and COPD. She states she was discharged from the hospital last month following another seizure event. Patient states she feels her "memory is getting worse." She had trouble during this call recalling and answering questions.   Medications: Patient states she is taking less than 15 meds. Denies any issues with affordability.She states she fills med planner weekly. She does voices being "forgetful and forgetting to take meds often." she states that spouse calls her daily when working to remind her to take meds.    Appointments: Patient statesshe saw PCP about 1-2 wks ago. She has been referred to another neurologist as she did not like the previous one she hd. She does not have appt date yet.  Consent: Patient gave verbal consent for [facility] services.   Plan: RN CM will notify [facility]  administrative assistant of case status. RN CM will send [facility] community referral for further in home eval and assessment of care needs. RN CM will send [facility] SW referral for possible assistance with community resources and financial issues. RN CM provided patient with [facility] 24 hrs Nurse Line number for future reference.    Enzo Montgomery, RN,BSN,CCM [facility] Telephonic Care Management Coordinator Direct Phone: [phone] Toll Free: [phone] Fax: [phone]

## 2015-07-25 DIAGNOSIS — M150 Primary generalized (osteo)arthritis: Secondary | ICD-10-CM

## 2015-07-25 DIAGNOSIS — M159 Polyosteoarthritis, unspecified: Secondary | ICD-10-CM | POA: Insufficient documentation

## 2015-07-25 DIAGNOSIS — R232 Flushing: Secondary | ICD-10-CM | POA: Insufficient documentation

## 2015-07-27 ENCOUNTER — Ambulatory Visit: Payer: Commercial Managed Care - HMO | Admitting: *Deleted

## 2015-07-28 ENCOUNTER — Other Ambulatory Visit: Payer: Self-pay | Admitting: *Deleted

## 2015-07-28 NOTE — Patient Outreach (Signed)
[facility] [facility]) Care Management  07/28/2015  [name] Feb 25, 1971 [id number]   Phone call to patient to assess for social work needs.  Per patient, due to her seizures disorder, she needs assistance in the home to assist with her ADL's.  Per patient, her husband works during the during  the day and she has no family that lives locally to assist her.  Per patient, she has 2 adult children that live in [place]. Pateint also discussed having financial difficulties, having trouble paying her month bills.  Patient states that she has Medicaid, however is unsure if it is active or not.    Plan:  Home visit scheduled for 08/03/15 10:30am to assist with options for in home needs.              [name] [facility] Care Management [phone]

## 2015-07-29 ENCOUNTER — Other Ambulatory Visit: Payer: Self-pay | Admitting: *Deleted

## 2015-07-29 NOTE — Patient Outreach (Signed)
[facility]) Care Management [name] Telephone Outreach 07/29/2015  [name] [birth year] [id number]  Successful telephone outreach to [name], 45 y/o female with history of seizures, anxiety,depression, stroke and COPD, referred by [facility] telephonic CM for in-home evaluation and assessment of care needs. [name] had recent IP hospital admission April 18-23, 2017 for seizure/ psuedoseizure disorder.  HIPAA verified and Ms. Branaman provided verbal consent for [place] telephone outreach and follow up.  Today, [name] reports that she "is having terrible stomach pain," and "can't use the bathroom to have a bowel movement."  [name] reports that she feels nauseous as well today.  [name] stated that she has not contacted her PCP provider, but states that she "had an appointment last week," and "was put a new medicine."  [name] states that she cannot remember the name of the medicine, nor exactly what date she attended her PCP appointment last week. (Information not available from review of EMR).  [name] reports that she "can't remember anything," and verbalized that she believes she has unexplained memory loss.  [name] was tearful at times during our telephone conversation, and I provided options to her of calling her PCP as soon as possible to report her stomach pain and make an appointment to be seen in follow up to the new medication she has been placed on and her current abdominal pan, as well as the option of going to the ED for immediate evaluation.  [name] stated that she did not want to go to the ED, but said she would call her PCP as soon as we ended our conversation.  I confirmed that [name] knew which provider to contact and she confirmed she did, and stated she had the office number.  [name] reported that her husband "is home every other day," around his work schedule and stated that  she uses [employer] transportation or Melburn Popper to attend provider appointments when he is not available to take her to her provider appointments.  [name] confirmed that she remembered speaking to [facility] CSW recently, and recalled that Chrystal would be visiting her next week.  [name] reported that she uses a cane 100% of the time to "get around."  Because of the pain [name] reported, we agreed that I would re-attempt telephone outreach next week, in hopes of scheduling an in-home visit in the near future.  Plan: [name] will contact her PCP this afternoon to report the pain she is experiencing and will make an appointment for evaluation. [place] telephone outreach next week to further assess care needs and schedule in-home visit   [name], RN, BSN, [employer] Coordinator [facility] Care Management  [phone]

## 2015-08-03 ENCOUNTER — Encounter: Payer: Self-pay | Admitting: *Deleted

## 2015-08-03 ENCOUNTER — Other Ambulatory Visit: Payer: Self-pay | Admitting: *Deleted

## 2015-08-03 NOTE — Patient Outreach (Signed)
[facility]) Care Management  [facility] Social Work  08/03/2015  [name] 09-Jul-1970 [id number]  Subjective:  Patient is a 45 year old female.  Objective:   Encounter Medications:  Outpatient Encounter Prescriptions as of 08/03/2015  Medication Sig  . acetaminophen (TYLENOL) 500 MG tablet Take 1,000 mg by mouth every 6 (six) hours as needed for mild pain or headache.  . albuterol (PROVENTIL HFA;VENTOLIN HFA) 108 (90 BASE) MCG/ACT inhaler Inhale 2 puffs into the lungs every 6 (six) hours as needed for wheezing or shortness of breath.   Marland Kitchen albuterol (PROVENTIL) (2.5 MG/3ML) 0.083% nebulizer solution Take 2.5 mg by nebulization every 6 (six) hours as needed for wheezing or shortness of breath. Reported on 05/10/2015  . aspirin EC 81 MG tablet Take 81 mg by mouth daily.  . divalproex (DEPAKOTE) 500 MG DR tablet Take 500 mg by mouth 2 (two) times daily.  Marland Kitchen EPINEPHrine (EPIPEN 2-PAK) 0.3 mg/0.3 mL IJ SOAJ injection Inject 0.3 mg into the muscle once as needed (for severe allergic reaction).  Marland Kitchen FLUoxetine (PROZAC) 20 MG capsule Take 20 mg by mouth daily.  Marland Kitchen gabapentin (NEURONTIN) 300 MG capsule Take 300 mg by mouth 2 (two) times daily.  Marland Kitchen ketorolac (TORADOL) 10 MG tablet Take 1 tablet (10 mg total) by mouth every 8 (eight) hours as needed for moderate pain or severe pain.  Marland Kitchen loratadine (CLARITIN) 10 MG tablet Take 10 mg by mouth daily.  . meloxicam (MOBIC) 7.5 MG tablet Take 7.5-15 mg by mouth daily as needed for pain.   . mirtazapine (REMERON) 15 MG tablet Take 15 mg by mouth at bedtime.  . promethazine (PHENERGAN) 25 MG tablet Take 25 mg by mouth every 6 (six) hours as needed for nausea or vomiting.  . topiramate (TOPAMAX) 25 MG tablet Take 2 tablets (50 mg total) by mouth at bedtime.  . topiramate (TOPAMAX) 25 MG tablet Take 1 tablet (25 mg total) by mouth daily.  Marland Kitchen lacosamide (VIMPAT) 200 MG TABS tablet Take 1 tablet (200 mg total) by mouth 2 (two) times daily.  Marland Kitchen  oxyCODONE (OXY IR/ROXICODONE) 5 MG immediate release tablet Take 1 tablet (5 mg total) by mouth every 6 (six) hours as needed for moderate pain or severe pain. (Patient not taking: Reported on 08/03/2015)  . pantoprazole (PROTONIX) 40 MG tablet Take 40 mg by mouth daily. Reported on 08/03/2015   No facility-administered encounter medications on file as of 08/03/2015.    Functional Status:  In your present state of health, do you have any difficulty performing the following activities: 08/03/2015 06/22/2015  Hearing? N N  Vision? Y N  Difficulty concentrating or making decisions? Y N  Walking or climbing stairs? Y Y  Dressing or bathing? Y N  Doing errands, shopping? Y N  Preparing Food and eating ? N -  Using the Toilet? N -  In the past six months, have you accidently leaked urine? Y -  Do you have problems with loss of bowel control? Y -  Managing your Medications? Y -  Managing your Finances? Y -  Housekeeping or managing your Housekeeping? N -    Fall/Depression Screening:  PHQ 2/9 Scores 08/03/2015 07/22/2015  PHQ - 2 Score 0 0    Assessment: This Child psychotherapist met with patient for initial social work needs assessment in her home.  Patient was very pleasant and friendly.  Patient discussed increased financial issues, more recently since her husband just loss his job.  Patient discussed being low  on food as well. Patient states that she has full Medicaid, however missed the due date to have additional documents in to complete re-certification process.  Phone call to patient's Medicaid worker, Maryruth Eve [phone] to see what could be done about completing this process. Patient also interested in applying for food stamps.    Patient discussed having a supportive husband, however no friends and family in the area. Patient denied depressed mood, suicide or homicide ideation. Financial assessment completed. Contact information given for consumer credit counseling to assist patient with  budgeting.  Home visit scheduled for 08/09/15 to completed medicaid re-certification process (patient will need proof of income and bank statement for February)   as well as food stamp application.  Application to apply for financial hardship to address patient's medical  bills will also be provided to patient.   Plan: Home visit scheduled for 08/09/15           Medicaid re-certification process to be completed           Food stamp application to be completed           Patient to be provided with list of local food banks.

## 2015-08-04 ENCOUNTER — Other Ambulatory Visit: Payer: Self-pay | Admitting: *Deleted

## 2015-08-04 ENCOUNTER — Encounter: Payer: Self-pay | Admitting: *Deleted
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
## 2015-08-04 NOTE — Patient Outreach (Signed)
[place] [facility]) Care Management  08/04/2015  Corla Scritchfield September 21, 1970 [id number]   Phone call from patient's Medicaid worker Maryruth Eve, stating that patient's Medicaid application will be re-opened and if her proof of income and bank statements are received by 08/09/15, she will not have to completed Medicaid process over again.  Plan:  Home visit scheduled for 08/09/15 to assist patient in the completion of this process.   [name] [facility] Care Management [phone]

## 2015-08-06 ENCOUNTER — Other Ambulatory Visit: Payer: Self-pay | Admitting: *Deleted

## 2015-08-06 NOTE — Patient Outreach (Signed)
[name]) Care Management  08/06/2015  [name] [birth year] [id number]   Phone call to patient to confirm home visit for 08/09/15.  Voicemail message could not be left as it was full.   [name] [facility] Care Management [phone]

## 2015-08-09 ENCOUNTER — Other Ambulatory Visit: Payer: Self-pay | Admitting: *Deleted

## 2015-08-09 ENCOUNTER — Telehealth: Payer: Self-pay

## 2015-08-09 NOTE — Telephone Encounter (Signed)
Pt called back. She says that she wants the morning appt for 08/10/15. An appt was made for 08/10/15 at 9:30. Pt verbalized understanding to arrive at 9:00.

## 2015-08-09 NOTE — Telephone Encounter (Signed)
I called pt to rescheduled her 08/10/15 appt since Dr. Brett Fairy will be out of the office tomorrow during that time. I offered pt a morning appt on 08/10/15 but pt states "That is not good. I will call you back to reschedule." I made sure that pt has our phone number. Pt was advised that her 08/10/15 appt will be rescheduled. Pt verbalized understanding.

## 2015-08-09 NOTE — Patient Outreach (Signed)
[facility]) Care Management  [facility] Social Work  08/09/2015  [name] 02-Jul-1970 [id number]  Subjective:  Patient is a 45 year old female.  Objective:   Encounter Medications:  Outpatient Encounter Prescriptions as of 08/09/2015  Medication Sig  . acetaminophen (TYLENOL) 500 MG tablet Take 1,000 mg by mouth every 6 (six) hours as needed for mild pain or headache.  . albuterol (PROVENTIL HFA;VENTOLIN HFA) 108 (90 BASE) MCG/ACT inhaler Inhale 2 puffs into the lungs every 6 (six) hours as needed for wheezing or shortness of breath.   Marland Kitchen albuterol (PROVENTIL) (2.5 MG/3ML) 0.083% nebulizer solution Take 2.5 mg by nebulization every 6 (six) hours as needed for wheezing or shortness of breath. Reported on 05/10/2015  . aspirin EC 81 MG tablet Take 81 mg by mouth daily.  . divalproex (DEPAKOTE) 500 MG DR tablet Take 500 mg by mouth 2 (two) times daily.  Marland Kitchen EPINEPHrine (EPIPEN 2-PAK) 0.3 mg/0.3 mL IJ SOAJ injection Inject 0.3 mg into the muscle once as needed (for severe allergic reaction).  Marland Kitchen FLUoxetine (PROZAC) 20 MG capsule Take 20 mg by mouth daily.  Marland Kitchen gabapentin (NEURONTIN) 300 MG capsule Take 300 mg by mouth 2 (two) times daily.  Marland Kitchen ketorolac (TORADOL) 10 MG tablet Take 1 tablet (10 mg total) by mouth every 8 (eight) hours as needed for moderate pain or severe pain.  Marland Kitchen lacosamide (VIMPAT) 200 MG TABS tablet Take 1 tablet (200 mg total) by mouth 2 (two) times daily.  Marland Kitchen loratadine (CLARITIN) 10 MG tablet Take 10 mg by mouth daily.  . meloxicam (MOBIC) 7.5 MG tablet Take 7.5-15 mg by mouth daily as needed for pain.   . mirtazapine (REMERON) 15 MG tablet Take 15 mg by mouth at bedtime.  Marland Kitchen oxyCODONE (OXY IR/ROXICODONE) 5 MG immediate release tablet Take 1 tablet (5 mg total) by mouth every 6 (six) hours as needed for moderate pain or severe pain. (Patient not taking: Reported on 08/03/2015)  . pantoprazole (PROTONIX) 40 MG tablet Take 40 mg by mouth daily. Reported on 08/03/2015   . promethazine (PHENERGAN) 25 MG tablet Take 25 mg by mouth every 6 (six) hours as needed for nausea or vomiting.  . topiramate (TOPAMAX) 25 MG tablet Take 2 tablets (50 mg total) by mouth at bedtime.  . topiramate (TOPAMAX) 25 MG tablet Take 1 tablet (25 mg total) by mouth daily.   No facility-administered encounter medications on file as of 08/09/2015.    Functional Status:  In your present state of health, do you have any difficulty performing the following activities: 08/03/2015 06/22/2015  Hearing? N N  Vision? Y N  Difficulty concentrating or making decisions? Y N  Walking or climbing stairs? Y Y  Dressing or bathing? Y N  Doing errands, shopping? Y N  Preparing Food and eating ? N -  Using the Toilet? N -  In the past six months, have you accidently leaked urine? Y -  Do you have problems with loss of bowel control? Y -  Managing your Medications? Y -  Managing your Finances? Y -  Housekeeping or managing your Housekeeping? N -    Fall/Depression Screening:  PHQ 2/9 Scores 08/03/2015 07/22/2015  PHQ - 2 Score 0 0    Assessment: This Child psychotherapist met with patient in her home.  Per patient, she just had a seizure upon this social worker's arrival.  Patient not sure how long it has been since her last seizure and states that she thinks it is  stress related. Per patient, her husband just lost his job and they are going through financial difficulty.  Seizure seemed to resolve within a short period of time.    Medicaid documents gathered to complete recertification, food stamp application completed, transportation form signed. This Education officer, museum spoke with spouse who  confirmed job loss, however he is actively looking for work and may have a job as a Curator soon.  Per spouse, his interview was today.  Gift card provided to patient through the [place] giving committee to purchase food. Plan:  Medicaid, food stamp and transportation documents submitted today to the  Department of Social Services office -[place]. This Education officer, museum will follow up with patient in approximately 1 month regarding status of Medicaid re-certification and food stamp application.     [name] [facility] Care Management [phone]

## 2015-08-10 ENCOUNTER — Encounter: Payer: Self-pay | Admitting: Neurology

## 2015-08-10 ENCOUNTER — Ambulatory Visit (INDEPENDENT_AMBULATORY_CARE_PROVIDER_SITE_OTHER): Payer: Commercial Managed Care - HMO | Admitting: Neurology

## 2015-08-10 ENCOUNTER — Ambulatory Visit: Payer: Commercial Managed Care - HMO | Admitting: Neurology

## 2015-08-10 ENCOUNTER — Other Ambulatory Visit: Payer: Self-pay | Admitting: *Deleted

## 2015-08-10 VITALS — BP 100/60 | HR 78 | Resp 20 | Ht 63.0 in | Wt 161.0 lb

## 2015-08-10 DIAGNOSIS — R0681 Apnea, not elsewhere classified: Secondary | ICD-10-CM | POA: Insufficient documentation

## 2015-08-10 DIAGNOSIS — R4789 Other speech disturbances: Secondary | ICD-10-CM

## 2015-08-10 DIAGNOSIS — R251 Tremor, unspecified: Secondary | ICD-10-CM | POA: Diagnosis not present

## 2015-08-10 DIAGNOSIS — IMO0001 Reserved for inherently not codable concepts without codable children: Secondary | ICD-10-CM | POA: Insufficient documentation

## 2015-08-10 MED ORDER — TOPIRAMATE 25 MG PO TABS: 25.0000 mg | ORAL_TABLET | Freq: Every day | ORAL | Status: DC

## 2015-08-10 MED ORDER — TOPIRAMATE 25 MG PO TABS: 50.0000 mg | ORAL_TABLET | Freq: Every day | ORAL | Status: DC

## 2015-08-10 NOTE — Patient Instructions (Signed)
Dear Mrs. Speagle, I will [facility], Dr. Ernest Haber .

## 2015-08-10 NOTE — Addendum Note (Signed)
Addended by: Larey Seat on: 08/10/2015 10:13 AM   Modules accepted: Orders
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
## 2015-08-10 NOTE — Patient Outreach (Signed)
[place] [facility]) Care Management  08/10/2015  [name] September 09, 1970 [id number]   Phone call from patient and patient's spouse wanting confirmation that patient's Medicaid re-certification documents were submitted to the Department of [place].  Per patient, she went to the Neurologist today and was prescribed pain medication, however cannot get the prescription filled because her Medicaid is showing inactive.  This social worker confirmed that the paperwork along with her food stamp and Medicaid transportation request was submitted on 08/09/15.  Department of Social Services worker, Maryruth Eve notified that paperwork had been scanned in.  It was suggested that patient make a follow up call to Maryruth Eve to ensure that there were no additional documents needed.  Patient stated that she would follow up today.  Plan: This Education officer, museum to follow up with patient within 1 week regarding Medicaid re-certification process.

## 2015-08-11 ENCOUNTER — Ambulatory Visit: Payer: Self-pay | Admitting: *Deleted

## 2015-08-11 ENCOUNTER — Telehealth: Payer: Self-pay | Admitting: Neurology

## 2015-08-11 ENCOUNTER — Other Ambulatory Visit: Payer: Self-pay | Admitting: *Deleted

## 2015-08-11 NOTE — Telephone Encounter (Signed)
Called Dr. Amalia Hailey office and she will be out office the office until end of July or  August . The office does not have a for sure date of her return. [facility] they do have opening's when I spoke to the patient she relayed she does not want to travel . Please advise I will place referral on pending review.

## 2015-08-11 NOTE — Telephone Encounter (Signed)
NOV[place]

## 2015-08-11 NOTE — Telephone Encounter (Signed)
EMU evaluation by Dr Ernest Haber , Osborne Oman - female Doctor with offices in [place] and [place].

## 2015-08-11 NOTE — Patient Outreach (Signed)
[place] [facility]) Care Management [place] Telephone Outreach 08/11/2015  [name] 05-27-1970 [id number]  Unsuccessful telephone outreach to [name], 45 y/o female with history of seizures, anxiety,depression, stroke and COPD, referred by [facility] telephonic CM for in-home evaluation and assessment of care needs. Ms. [name] had recent IP hospital admission April 18-23, 2017 for seizure/ psuedoseizure disorder.   I was unable to leave patient voicemail message, as automated outgoing message stated that voicemail box has not been yet been set up.  Plan: Will re-attempt [place] telephone outreach later this week.  [place] in-home visit scheduled for later this month.  [name], RN, BSN, [employer] [facility] Care Management  [phone]

## 2015-08-12 NOTE — Telephone Encounter (Signed)
[facility] Where Dr. Ernest Haber worked and patient has apt Friday Friday 16 th at 10:00 am . Called and spoke to patient and still did not want to travel . I relayed to patient that Dr. Brett Fairy felt very strongly about her going to apt. Patient agreed. Patient has all details of apt. And she will be there next Friday.  Dr. Ernest Haber  [place] [address]. Telephone [phone] Fax [phone]. Referral has been sent.

## 2015-08-13 ENCOUNTER — Encounter: Payer: Self-pay | Admitting: *Deleted

## 2015-08-13 ENCOUNTER — Other Ambulatory Visit: Payer: Self-pay | Admitting: *Deleted

## 2015-08-13 ENCOUNTER — Ambulatory Visit: Payer: Self-pay | Admitting: *Deleted

## 2015-08-13 NOTE — Patient Outreach (Signed)
[place] [facility]) Care Management  08/13/2015  Justa Arciero 08/14/1970 [id number]   Phone call to patient to follow up on Medicaid re-certification. HIPPA compliant voicemail message left requesting a return call.    [name] [facility] Care Management [phone]

## 2015-08-13 NOTE — Patient Outreach (Signed)
[place] [facility]) Care Management [place] Telephone Outreach 08/13/2015  [name] [birth year] [id number]  Successful telephone outreach to [name], 45 y/o female with history of seizures, anxiety,depression, stroke and COPD, referred by [facility] telephonic CM for in-home evaluation and assessment of care needs. Ms. [name] had recent IP hospital admission April 18-23, 2017 for seizure/ psuedoseizure disorder.   Today, Ms. [name] reports that she is "doing pretty good," and attended her recent neurology appoinment.  Ms. Hottinger put her phone on speaker phone and she and her husband reported that the neurologist has arranged for "further testing" to fond out the cause of her seizures, stating that no neurologic cause could be determined during the recent follow up visit.  Mr. And Mrs. Schoneman state that [name]'s seizures are "under better control" with her current medication regimen.  [name] also reports that she had a recent PCP visit and "no changes were made," stating that she got "a good report."  [name] reported that she will return to her PCP in August 2017 for a follow up visit.  I made [name] aware of my general schedule for the upcoming weeks, and made sure that she had the main [facility] CM and 24-hour nurse line phone numbers.   Plan:  [name] will continue taking her medications as they are prescribed and will attend all scheduled provider appointments.  [name] will contact her providers for any concerns, issues, questions or problems that arise.  [place] in-home visit scheduled for later this month.  [name], RN, BSN, [employer] [facility] Care Management  [phone]

## 2015-08-16 ENCOUNTER — Other Ambulatory Visit: Payer: Self-pay | Admitting: *Deleted

## 2015-08-16 ENCOUNTER — Ambulatory Visit: Payer: Self-pay | Admitting: *Deleted

## 2015-08-16 NOTE — Patient Outreach (Signed)
[place] [facility]) Care Management  08/16/2015  [name] 08/26/1970 [id number]   Return call from patient.  Patient states that her Food Stamps and Medicaid have been reinstated.  She has also been approved for [employer].  Patient agrees to need for personal care assistance in the home.   Plan:  Request for Independent Assessment for [place] faxed to patient's doctor to complete.   [name] [facility] Care Management [phone]

## 2015-08-16 NOTE — Patient Outreach (Signed)
[place] [facility]) Care Management  08/16/2015  [name] 1970/12/29 [id number]   Follow up phone call on status of patient re-certification process for Medicaid and Food Stamps.  HIPPA compliant voicemail message left for a return call.  Plan:  This Education officer, museum will follow up with patient within 1 week.   [name] [facility] Care Management [phone]

## 2015-08-18 NOTE — Progress Notes (Signed)
This encounter was created in error - please disregard.

## 2015-08-23 ENCOUNTER — Other Ambulatory Visit: Payer: Self-pay | Admitting: Neurology

## 2015-08-23 ENCOUNTER — Ambulatory Visit: Payer: Self-pay | Admitting: *Deleted

## 2015-08-23 MED ORDER — TOPIRAMATE 25 MG PO TABS: ORAL_TABLET | ORAL | Status: DC

## 2015-08-23 NOTE — Telephone Encounter (Signed)
New RX for topamax signed and faxed to [facility] in [place]. Received a receipt of confirmation.

## 2015-08-23 NOTE — Addendum Note (Signed)
Addended by: Lester Rosemont A on: 08/23/2015 01:16 PM   Modules accepted: Orders

## 2015-08-23 NOTE — Telephone Encounter (Signed)
[place] [phone] called to request modification of Topamax, needs 1 Rx with both quantities and both sets of directions so insurance will cover it.

## 2015-08-23 NOTE — Telephone Encounter (Signed)
There are two prescriptions of topamax 25mg   prescribed by Dr. Brett Fairy. One is to be taken daily and one says to take 2 tablets at bedtime.   Pt's insurance needs one prescription. Should the RX read topamax 25mg  take 1 tablet in the morning and 2 tablets at bedtime?

## 2015-08-23 NOTE — Telephone Encounter (Signed)
We discussed weaning her , so she would go to 50 mg at night , in form of 2 times 25 mg tabs and after 30 days to 1 tab 25 mg at night. CD

## 2015-08-26 ENCOUNTER — Other Ambulatory Visit: Payer: Self-pay | Admitting: *Deleted

## 2015-08-26 NOTE — Patient Outreach (Signed)
[facility]) Care Management  08/26/2015  [name] 01-13-1971 [id number]   Phone call from patient and patient's Department of Social Services/Adult Medicaid worker [name] stating that patient needed to have a [employer] Exception Verification completed in order to be transported out of the county tomorrow to her scheduled appointment at [employer]  in [place].  Form received by patient's  Medicaid worker and taken to patient first for signature, then to patient's provider's office for signature.  Form faxed back to the Department of [place] from patient's provider's office.  Patient was informed that had been completed and returned to the Department of [place].  Patient will contact Medicaid transportationa to schedule ride to [facility].    [name] [facility] Care Management [phone]

## 2015-08-27 DIAGNOSIS — G40804 Other epilepsy, intractable, without status epilepticus: Secondary | ICD-10-CM | POA: Diagnosis not present

## 2015-08-27 DIAGNOSIS — G43119 Migraine with aura, intractable, without status migrainosus: Secondary | ICD-10-CM | POA: Diagnosis not present

## 2015-08-30 ENCOUNTER — Other Ambulatory Visit: Payer: Self-pay | Admitting: *Deleted

## 2015-08-30 ENCOUNTER — Encounter: Payer: Self-pay | Admitting: *Deleted
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
## 2015-09-03 ENCOUNTER — Ambulatory Visit: Payer: Self-pay | Admitting: *Deleted

## 2015-09-08 ENCOUNTER — Ambulatory Visit: Payer: Self-pay | Admitting: *Deleted

## 2015-09-09 ENCOUNTER — Other Ambulatory Visit: Payer: Self-pay | Admitting: *Deleted

## 2015-09-09 NOTE — Patient Outreach (Signed)
[facility]) Care Management  [facility] Social Work  09/09/2015  [name] 06-06-1970 [id number]  Subjective:   Patient is a 45 year old female. Per patient, she has been re-certified for her Medicaid.  She receives food stamps and medicaid transportation.  Patient is scheduled for a 5 day seizure study on 10/04/15 Objective:   Encounter Medications:  Outpatient Encounter Prescriptions as of 09/09/2015  Medication Sig Note  . acetaminophen (TYLENOL) 500 MG tablet Take 1,000 mg by mouth every 6 (six) hours as needed for mild pain or headache.   . albuterol (PROVENTIL HFA;VENTOLIN HFA) 108 (90 BASE) MCG/ACT inhaler Inhale 2 puffs into the lungs every 6 (six) hours as needed for wheezing or shortness of breath.    Marland Kitchen albuterol (PROVENTIL) (2.5 MG/3ML) 0.083% nebulizer solution Take 2.5 mg by nebulization every 6 (six) hours as needed for wheezing or shortness of breath. Reported on 05/10/2015   . aspirin EC 81 MG tablet Take 81 mg by mouth daily.   . divalproex (DEPAKOTE) 500 MG DR tablet Take 500 mg by mouth 2 (two) times daily.   Marland Kitchen FLUoxetine (PROZAC) 20 MG capsule Take 20 mg by mouth daily.   Marland Kitchen gabapentin (NEURONTIN) 300 MG capsule Take 300 mg by mouth 2 (two) times daily.   Marland Kitchen lacosamide (VIMPAT) 200 MG TABS tablet Take 1 tablet (200 mg total) by mouth 2 (two) times daily.   . meloxicam (MOBIC) 7.5 MG tablet Take 7.5-15 mg by mouth daily as needed for pain.  08/30/2015: Uses occasionally, patient states usually takes 2-3 times per week  . mirtazapine (REMERON) 15 MG tablet Take 15 mg by mouth at bedtime.   . pantoprazole (PROTONIX) 40 MG tablet Take 40 mg by mouth daily. Reported on 08/03/2015   . promethazine (PHENERGAN) 25 MG tablet Take 25 mg by mouth every 6 (six) hours as needed for nausea or vomiting.   . dicyclomine (BENTYL) 10 MG capsule Take 10 mg by mouth. Reported on 09/09/2015 08/30/2015: Patient stated that she has not been taking because she didn't think she needs it    . EPINEPHrine (EPIPEN 2-PAK) 0.3 mg/0.3 mL IJ SOAJ injection Inject 0.3 mg into the muscle once as needed (for severe allergic reaction). 09/09/2015: Only when needed  . ketorolac (TORADOL) 10 MG tablet Take 1 tablet (10 mg total) by mouth every 8 (eight) hours as needed for moderate pain or severe pain. (Patient not taking: Reported on 08/30/2015) 08/30/2015: Patient states that she has not been taking because her insurance will not pay for it; states that she plans to ask her doctor to write the Rx for something else instead  . loratadine (CLARITIN) 10 MG tablet Take 10 mg by mouth daily. Reported on 09/09/2015 08/30/2015: Patient reports has not needed recently   . topiramate (TOPAMAX) 25 MG tablet Take 2 tablets at bedtime for 30 days. Then take 1 tablet at bedtime for 30 days. 08/30/2015: New neurologist, Dr. Rosana Berger at [facility] has changed dosing; patient states she is taking 50 mg BID   No facility-administered encounter medications on file as of 09/09/2015.    Functional Status:  In your present state of health, do you have any difficulty performing the following activities: 08/30/2015 08/03/2015  Hearing? N N  Vision? Y Y  Difficulty concentrating or making decisions? Tempie Donning  Walking or climbing stairs? Y Y  Dressing or bathing? N Y  Doing errands, shopping? Tempie Donning  Preparing Food and eating ? N N  Using the  Toilet? N N  In the past six months, have you accidently leaked urine? Y Y  Do you have problems with loss of bowel control? N Y  Managing your Medications? Y Y  Managing your Finances? Tempie Donning  Housekeeping or managing your Housekeeping? Y N    Fall/Depression Screening:  PHQ 2/9 Scores 08/30/2015 08/03/2015 07/22/2015  PHQ - 2 Score 0 0 0   .   Assessment: Patient scheduled for a 5 day seizure study scheduled for 10/04/15 at 7:00am at [facility]  Neurology, patient's spouse will provide transportation.  Patient's medicaid has been re-certified, patient also has been approved for Medicaid  transportation. Patient also approved for food stamps.   Patient's MD has not returned the request form for the independent assessment for personal care services.  The request form will be re-submitted today.  Patient denies depressed mood, no suicide or homicide ideation.  Plan:  CSW to submit request form for independent assessment for personal care services to her primary care doctor.  CSW to follow up in 1 month.   [name] [facility] Care Management [phone]

## 2015-09-17 ENCOUNTER — Other Ambulatory Visit: Payer: Self-pay | Admitting: *Deleted

## 2015-09-17 NOTE — Patient Outreach (Signed)
[facility] [facility]) [place] Telephone Outreach 09/17/2015  Allesha Anastasia 26-Apr-1970 [id number]  Rakiah Proscia is an 45 y.o. female with history of seizures, anxiety,depression, stroke and COPD, referred by [facility] telephonic CM for in-home evaluation and assessment of care needs. [name] had recent IP hospital admission April 18-23, 2017 for seizure/ psuedoseizure disorder.   Today, Ms. Scalese reports that she is "doing pretty good," and states that she is not currently having pain or discomfort.  Ms. Barca reports that her husband believes she had seizure "the other day," but is unable to report the outcome of the seizure event, stating she "doesn't remember what happened," but states that she "did not have to be taken to the doctor or the hospital."  Ms. Gerhart reported that her husband has continued journalling her symptoms and "episodes," stating that he "has also taken photos to show the doctor."  [name] reports today that she is planning to attend her appointment in early August at [facility], and also reports that she has a PCP office visit in August, stating that she "doesn't remember exactly when the appointment is."  Ms. Nooney denies further concerns, issues, problems, or questions today.  I made Ms. Devers aware of my general upcoming schedule and confirmed that she had the main phone number for [facility] Cm, as well as the number for the 24-hour nurse advice line.  During our phone conversation today, we confirmed our next scheduled [place] home visit for next month.  Plan:  Ms. Ortlieb will take her husband's journal notes/ details of her seizure-like activity and memory loss episodes to her upcoming neurology appointment at the end of July 2017 for long-term EEG monitoring.   Ms. Giudici will continue taking her medications as they are prescribed and will attend all scheduled  provider appointments.  Ms. Diamico will write down her questions about her medications to take with her to her provider appointments.  Ms. Winker will contact her providers for any concerns, issues, questions or problems that arise.  Ongoing [employer], with telephone outreach scheduled in 3 weeks and next [facility] in-home visit scheduled after long-term video EEG monitoring in early August 2017.  [name], RN, BSN, [employer] [facility] Care Management  [phone]

## 2015-09-26 ENCOUNTER — Emergency Department
Admission: EM | Admit: 2015-09-26 | Discharge: 2015-09-27 | Disposition: A | Discharge status: Other Acute Inpatient Hospital | Payer: Commercial Managed Care - HMO | Attending: Emergency Medicine | Admitting: Emergency Medicine

## 2015-09-26 ENCOUNTER — Encounter: Payer: Self-pay | Admitting: Emergency Medicine

## 2015-09-26 DIAGNOSIS — F17200 Nicotine dependence, unspecified, uncomplicated: Secondary | ICD-10-CM | POA: Diagnosis not present

## 2015-09-26 DIAGNOSIS — R1031 Right lower quadrant pain: Secondary | ICD-10-CM | POA: Diagnosis not present

## 2015-09-26 DIAGNOSIS — J45909 Unspecified asthma, uncomplicated: Secondary | ICD-10-CM | POA: Insufficient documentation

## 2015-09-26 DIAGNOSIS — F445 Conversion disorder with seizures or convulsions: Secondary | ICD-10-CM | POA: Insufficient documentation

## 2015-09-26 DIAGNOSIS — Z7982 Long term (current) use of aspirin: Secondary | ICD-10-CM | POA: Diagnosis not present

## 2015-09-26 DIAGNOSIS — F329 Major depressive disorder, single episode, unspecified: Secondary | ICD-10-CM | POA: Insufficient documentation

## 2015-09-26 DIAGNOSIS — M1990 Unspecified osteoarthritis, unspecified site: Secondary | ICD-10-CM | POA: Insufficient documentation

## 2015-09-26 DIAGNOSIS — J449 Chronic obstructive pulmonary disease, unspecified: Secondary | ICD-10-CM | POA: Diagnosis not present

## 2015-09-26 DIAGNOSIS — Z8673 Personal history of transient ischemic attack (TIA), and cerebral infarction without residual deficits: Secondary | ICD-10-CM | POA: Insufficient documentation

## 2015-09-26 LAB — COMPREHENSIVE METABOLIC PANEL
ALBUMIN: 4.3 g/dL (ref 3.5–5.0)
ALT: 16 U/L (ref 14–54)
AST: 27 U/L (ref 15–41)
Alkaline Phosphatase: 77 U/L (ref 38–126)
Anion gap: 10 (ref 5–15)
BILIRUBIN TOTAL: 0.4 mg/dL (ref 0.3–1.2)
BUN: 24 mg/dL — AB (ref 6–20)
CHLORIDE: 110 mmol/L (ref 101–111)
CO2: 21 mmol/L — ABNORMAL LOW (ref 22–32)
CREATININE: 0.72 mg/dL (ref 0.44–1.00)
Calcium: 9.3 mg/dL (ref 8.9–10.3)
GFR calc Af Amer: >60 mL/min (ref >60)
GLUCOSE: 98 mg/dL (ref 65–99)
POTASSIUM: 3.6 mmol/L (ref 3.5–5.1)
Sodium: 141 mmol/L (ref 135–145)
TOTAL PROTEIN: 7.3 g/dL (ref 6.5–8.1)

## 2015-09-26 LAB — CBC
HEMATOCRIT: 40.7 % (ref 35.0–47.0)
Hemoglobin: 13.9 g/dL (ref 12.0–16.0)
MCH: 30.5 pg (ref 26.0–34.0)
MCHC: 34.3 g/dL (ref 32.0–36.0)
MCV: 89.2 fL (ref 80.0–100.0)
PLATELETS: 206 10*3/uL (ref 150–440)
RBC: 4.56 MIL/uL (ref 3.80–5.20)
RDW: 12.8 % (ref 11.5–14.5)
WBC: 7.3 10*3/uL (ref 3.6–11.0)

## 2015-09-26 LAB — LIPASE, BLOOD: Lipase: 31 U/L (ref 11–51)

## 2015-09-26 LAB — GLUCOSE, CAPILLARY: Glucose-Capillary: 97 mg/dL (ref 65–99)

## 2015-09-26 MED ORDER — LORAZEPAM 2 MG/ML IJ SOLN
1.0000 mg | Freq: Once | INTRAMUSCULAR | Status: AC
  Administered 2015-09-26: 1 mg via INTRAVENOUS

## 2015-09-26 MED ORDER — LORAZEPAM 2 MG/ML IJ SOLN
2.0000 mg | Freq: Once | INTRAMUSCULAR | Status: AC
  Administered 2015-09-26: 2 mg via INTRAVENOUS

## 2015-09-26 MED ORDER — SODIUM CHLORIDE 0.9 % IV BOLUS (SEPSIS)
1000.0000 mL | Freq: Once | INTRAVENOUS | Status: AC
  Administered 2015-09-26: 1000 mL via INTRAVENOUS

## 2015-09-26 MED ORDER — LORAZEPAM 2 MG/ML IJ SOLN
INTRAMUSCULAR | Status: AC
  Filled 2015-09-26: qty 1

## 2015-09-26 MED ORDER — ONDANSETRON 4 MG PO TBDP: 4.0000 mg | ORAL_TABLET | Freq: Once | ORAL | Status: DC | PRN

## 2015-09-26 MED ORDER — KETOROLAC TROMETHAMINE 30 MG/ML IJ SOLN
15.0000 mg | INTRAMUSCULAR | Status: AC
  Administered 2015-09-26: 15 mg via INTRAVENOUS
  Filled 2015-09-26: qty 1

## 2015-09-26 MED ORDER — DIAZEPAM 5 MG/ML IJ SOLN
5.0000 mg | Freq: Once | INTRAMUSCULAR | Status: AC
  Administered 2015-09-26: 5 mg via INTRAVENOUS
  Filled 2015-09-26: qty 2

## 2015-09-26 MED ORDER — ONDANSETRON HCL 4 MG/2ML IJ SOLN
4.0000 mg | Freq: Once | INTRAMUSCULAR | Status: AC
  Administered 2015-09-26: 4 mg via INTRAVENOUS
  Filled 2015-09-26: qty 2

## 2015-09-26 NOTE — ED Triage Notes (Signed)
Had bright bowel movement with clots 3 days ago. Has had abdominal pain getting progressively worse for last 4 days. C/O SHOB. Has not had any further blood bowel movement. Has had vomiting; no blood in vomit. Denies urinary sx. No respiratory distress noted. Decreased appetite.
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
## 2015-09-26 NOTE — ED Notes (Signed)
Pt continues to have activity to appears to be tonic-clonic seizures. Pt has not been incontinent of urine or stool, but exhibits no post-ictal behavior since first seizure. Pt has been repeatedly requesting narcotic pain meds and has been told that while she is having seizures, she will receive no narcotics for her pain. It has been communicated w/ pt acknowledging her understanding that seizure medications have been administered as have anti-nausea medications and that I would discuss pain meds with her physician.

## 2015-09-27 ENCOUNTER — Emergency Department (HOSPITAL_COMMUNITY): Payer: Commercial Managed Care - HMO

## 2015-09-27 ENCOUNTER — Emergency Department (HOSPITAL_COMMUNITY)
Admission: EM | Admit: 2015-09-27 | Discharge: 2015-09-27 | Disposition: A | Discharge status: Home / Self Care | Payer: Commercial Managed Care - HMO | Attending: Emergency Medicine | Admitting: Emergency Medicine

## 2015-09-27 ENCOUNTER — Encounter (HOSPITAL_COMMUNITY): Payer: Self-pay | Admitting: Oncology

## 2015-09-27 DIAGNOSIS — R197 Diarrhea, unspecified: Secondary | ICD-10-CM | POA: Diagnosis not present

## 2015-09-27 DIAGNOSIS — Z8673 Personal history of transient ischemic attack (TIA), and cerebral infarction without residual deficits: Secondary | ICD-10-CM | POA: Insufficient documentation

## 2015-09-27 DIAGNOSIS — R1031 Right lower quadrant pain: Secondary | ICD-10-CM

## 2015-09-27 DIAGNOSIS — F329 Major depressive disorder, single episode, unspecified: Secondary | ICD-10-CM | POA: Insufficient documentation

## 2015-09-27 DIAGNOSIS — R109 Unspecified abdominal pain: Secondary | ICD-10-CM

## 2015-09-27 DIAGNOSIS — Z7982 Long term (current) use of aspirin: Secondary | ICD-10-CM | POA: Insufficient documentation

## 2015-09-27 DIAGNOSIS — J45909 Unspecified asthma, uncomplicated: Secondary | ICD-10-CM | POA: Diagnosis not present

## 2015-09-27 DIAGNOSIS — J449 Chronic obstructive pulmonary disease, unspecified: Secondary | ICD-10-CM | POA: Diagnosis not present

## 2015-09-27 DIAGNOSIS — M1990 Unspecified osteoarthritis, unspecified site: Secondary | ICD-10-CM | POA: Diagnosis not present

## 2015-09-27 DIAGNOSIS — F17200 Nicotine dependence, unspecified, uncomplicated: Secondary | ICD-10-CM | POA: Insufficient documentation

## 2015-09-27 DIAGNOSIS — R1033 Periumbilical pain: Secondary | ICD-10-CM | POA: Insufficient documentation

## 2015-09-27 DIAGNOSIS — R569 Unspecified convulsions: Secondary | ICD-10-CM | POA: Diagnosis not present

## 2015-09-27 DIAGNOSIS — Z7401 Bed confinement status: Secondary | ICD-10-CM | POA: Diagnosis not present

## 2015-09-27 MED ORDER — HYDROCODONE-ACETAMINOPHEN 5-325 MG PO TABS: 1.0000 | ORAL_TABLET | Freq: Four times a day (QID) | ORAL | 0 refills | Status: DC | PRN

## 2015-09-27 NOTE — Discharge Instructions (Signed)
Hydrocodone as prescribed as needed for pain.  Follow-up with your primary Dr. if you're not improving in the next 3 days, and return to the ER if your symptoms significantly worsen or change.
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
## 2015-09-27 NOTE — ED Notes (Signed)
Bed: [id number] Expected date:  Expected time:  Means of arrival:  Comments: Pt from [facility]

## 2015-09-27 NOTE — ED Triage Notes (Signed)
Pt transported from [facility] to have a CT scan to r/o acute appendicitis.  Pt is A&O x 4.  Per EMS pt has 1 episode of pseudoseizure en route as well as continually requested pain medication.
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
## 2015-09-27 NOTE — ED Provider Notes (Signed)
-----------------------------------------   2:59 AM on 09/27/2015 -----------------------------------------  Patient care Assumed from Dr. Dineen Kid. Patient has had 2 episodes of what she describes as seizures however no post ictal periods, is awake and alert on her cell phone no distress. I discussed the patient with [facility]. Pt accepted to ER for CT scan to eval abdomen.  Pt appears overall very well at this time.  I have reviewed care everywhere and pt with a dx of nonepileptiform seizures.    Harvest Dark, MD 09/27/15 [phone]

## 2015-09-29 ENCOUNTER — Other Ambulatory Visit: Payer: Self-pay | Admitting: *Deleted

## 2015-09-29 NOTE — Patient Outreach (Signed)
[facility]) Care Management  09/29/2015  [name] Jul 05, 1970 [id number]   Phone call to patient to follow up on request for personal care services.  Per patient, she received a letter stating that her insurance did not cover this service.  However it had been previously verified that patient had full Medicaid benefits.   Plan:  Home visit scheduled for 10/14/15 to assist patient in resolving this issue.   [name] [facility] Care Management [phone]

## 2015-10-04 DIAGNOSIS — G43909 Migraine, unspecified, not intractable, without status migrainosus: Secondary | ICD-10-CM | POA: Diagnosis not present

## 2015-10-04 DIAGNOSIS — G40804 Other epilepsy, intractable, without status epilepticus: Secondary | ICD-10-CM | POA: Insufficient documentation

## 2015-10-04 DIAGNOSIS — K589 Irritable bowel syndrome without diarrhea: Secondary | ICD-10-CM | POA: Diagnosis not present

## 2015-10-04 DIAGNOSIS — G8929 Other chronic pain: Secondary | ICD-10-CM | POA: Diagnosis not present

## 2015-10-04 DIAGNOSIS — K219 Gastro-esophageal reflux disease without esophagitis: Secondary | ICD-10-CM | POA: Diagnosis not present

## 2015-10-12 ENCOUNTER — Other Ambulatory Visit: Payer: Self-pay | Admitting: *Deleted

## 2015-10-12 ENCOUNTER — Encounter: Payer: Self-pay | Admitting: *Deleted

## 2015-10-12 NOTE — Patient Outreach (Signed)
[facility]) Care Management  [place] Routine Home Visit/ Discharge from [place] 10/12/2015  [name] Apr 08, 1970 [id number]  [name] is an 45 y.o. female with history of seizures, anxiety,depression, stroke and COPD, referred by [facility] telephonic CM for in-home evaluation and assessment of care needs. [name] had recent IP hospital admission April 18-23, 2017 for seizure/ psuedoseizure disorder.  [name] also had a recent ED visit on September 26, 2015 for abdominal pain; patient was discharged and cause of abdominal pain was not determined by CT imaging.  Patient reported that she attended her planned visit in "late July" to [facility] for "EEG video monitoring," where, per patient report, no seizure activity was detected.  [name] states that during that visit some of her medications were discontinued/ adjusted (see Med rec), which she believes has made her feel "100% better."  [name] reported that she has been taking her medications as they are prescribed and verbalizes an good understanding of the purpose, dosing, and scheduling of her medications.  Today, made joint visit with [place], [facility] CSW, who is also involved in patient's care.  [name] visibly appears to feel better; she is moving around her home without hesitation or use of her cane, and appears very steady on her feet.  [name] continually stated during today's home visit that she is feeling and doing "so much better."  For most of the visit, patient was completely engaged and provided accurate reporting of her prior/ future provider appointments and communication she has had with her insurance company, etc, without apparent difficulty with her memory.  However, patient had a brief (30-second) episode where her face went blank, she stared off into space, and she did not respond to questions asked of her.  [name] was in no apparent distress throughout  this episode, which resolved spontaneously without intervention.  When this episode was over, patient resumed back to her previous state without noticeable changes.  [name] reported that she is considering seeing a psychiatrist or counselor to follow up on her previously stated childhood/ young adult traumatic experiences, which [name] and I both encouraged.  [name] provided patient with several options available for psychiatric counseling care in her area.  We discussed today that thus far, patient has successfully met her stated [place] CM goals.  Patient verbalized that she agreed she had met these goals and declined making further goals, reporting that she believes she is able to manage her care successfully with the ongoing help and assistance of her very supportive husband.  [name] stated several times during our visit today that she believes she is ready for discharge from [place].  [name] and I both shared with patient that should she have needs in the future, [facility] CM could become re-involved in her care, and we made sure that the patient had both of our direct phone numbers.  [name] denied further concerns, needs, problems or issues today.  Subjective: "I am doing so much better!  I feel like my old self again!"  Objective:    BP 122/64   Pulse 74   Resp 18   LMP  (LMP Unknown)   SpO2 98%    Review of Systems  Constitutional: Negative.  Negative for fever and malaise/fatigue.  Eyes:       Patient reports that she needs new eyeglasses/ prescription  Respiratory: Negative.  Negative for cough, sputum production, shortness of breath and wheezing.  Cardiovascular: Negative for chest pain and leg swelling.  Gastrointestinal: Negative.  Negative for abdominal pain and nausea.  Genitourinary: Negative.   Musculoskeletal: Positive for joint pain and myalgias.       Patient reports ongoing/ chronic shoulder pain  Skin: Negative.   Neurological:  Negative for dizziness, speech change, seizures, weakness and headaches.       No new seizures reports by patient today.  Patient reports attending EEG monitoring unit at [facility] last month  Psychiatric/Behavioral: Negative.  Negative for depression. The patient is not nervous/anxious.        Patient had a brief (~30 second) episode today while sitting in her chair where her face went blank, she stared into space, and had apparent speech arrest.  No classic seizure activity was noted during this episode, patient remained still and upright in her chair, staring off into space.  This episode spontaneously resolved, and patient returned to her baseline without intervention.    Physical Exam  Constitutional: She is oriented to person, place, and time. She appears well-developed and well-nourished.  Cardiovascular: Normal rate, regular rhythm, normal heart sounds and intact distal pulses.   Respiratory: Effort normal and breath sounds normal. No respiratory distress. She has no wheezes. She has no rales.  GI: Soft. Bowel sounds are normal.  Musculoskeletal: She exhibits no edema.  Neurological: She is alert and oriented to person, place, and time.  Skin: Skin is warm and dry.  Psychiatric: She has a normal mood and affect. Her behavior is normal. Judgment and thought content normal.    Encounter Medications:   Outpatient Encounter Prescriptions as of 10/12/2015  Medication Sig Note  . acetaminophen (TYLENOL) 500 MG tablet Take 1,000 mg by mouth every 6 (six) hours as needed for mild pain or headache.   . albuterol (PROVENTIL HFA;VENTOLIN HFA) 108 (90 BASE) MCG/ACT inhaler Inhale 2 puffs into the lungs every 6 (six) hours as needed for wheezing or shortness of breath.    Marland Kitchen aspirin EC 81 MG tablet Take 81 mg by mouth daily.   Marland Kitchen dicyclomine (BENTYL) 10 MG capsule Take 10 mg by mouth. Reported on 09/09/2015 08/30/2015: Patient stated that she has not been taking because she didn't think she needs  it   . divalproex (DEPAKOTE) 500 MG DR tablet Take 1,000 mg by mouth 2 (two) times daily.    Marland Kitchen EPINEPHrine (EPIPEN 2-PAK) 0.3 mg/0.3 mL IJ SOAJ injection Inject 0.3 mg into the muscle once as needed (for severe allergic reaction). 09/09/2015: Only when needed  . FLUoxetine (PROZAC) 20 MG capsule Take 20 mg by mouth daily.   Marland Kitchen gabapentin (NEURONTIN) 300 MG capsule Take 300 mg by mouth 2 (two) times daily.   Marland Kitchen HYDROcodone-acetaminophen (NORCO) 5-325 MG tablet Take 1-2 tablets by mouth every 6 (six) hours as needed.   Marland Kitchen ketorolac (TORADOL) 10 MG tablet Take 1 tablet (10 mg total) by mouth every 8 (eight) hours as needed for moderate pain or severe pain. (Patient not taking: Reported on 08/30/2015) 08/30/2015: Patient states that she has not been taking because her insurance will not pay for it; states that she plans to ask her doctor to write the Rx for something else instead  . lacosamide (VIMPAT) 200 MG TABS tablet Take 1 tablet (200 mg total) by mouth 2 (two) times daily.   Marland Kitchen loratadine (CLARITIN) 10 MG tablet Take 10 mg by mouth daily. Reported on 09/09/2015 08/30/2015: Patient reports has not needed recently   . meloxicam (MOBIC) 7.5 MG  tablet Take 7.5-15 mg by mouth daily as needed for pain.  08/30/2015: Uses occasionally, patient states usually takes 2-3 times per week  . mirtazapine (REMERON) 15 MG tablet Take 15 mg by mouth at bedtime.   . Multiple Vitamin (MULTIVITAMIN) tablet Take 1 tablet by mouth daily.   . pantoprazole (PROTONIX) 40 MG tablet Take 40 mg by mouth daily. Reported on 08/03/2015   . promethazine (PHENERGAN) 25 MG tablet Take 25 mg by mouth every 6 (six) hours as needed for nausea or vomiting.   . topiramate (TOPAMAX) 25 MG tablet Take 2 tablets at bedtime for 30 days. Then take 1 tablet at bedtime for 30 days. (Patient taking differently: Take 50 mg by mouth 2 (two) times daily. Take 2 tablets at bedtime for 30 days. Then take 1 tablet at bedtime for 30 days.) 08/30/2015: New neurologist,  Dr. Rosana Berger at [facility] has changed dosing; patient states she is taking 50 mg BID   No facility-administered encounter medications on file as of 10/12/2015.     Functional Status:   In your present state of health, do you have any difficulty performing the following activities: 08/30/2015 08/03/2015  Hearing? N N  Vision? Y Y  Difficulty concentrating or making decisions? Tempie Donning  Walking or climbing stairs? Y Y  Dressing or bathing? N Y  Doing errands, shopping? Tempie Donning  Preparing Food and eating ? N N  Using the Toilet? N N  In the past six months, have you accidently leaked urine? Y Y  Do you have problems with loss of bowel control? N Y  Managing your Medications? Y Y  Managing your Finances? Tempie Donning  Housekeeping or managing your Housekeeping? Y N  Some recent data might be hidden    Fall/Depression Screening:    PHQ 2/9 Scores 09/09/2015 08/30/2015 08/03/2015 07/22/2015  PHQ - 2 Score 0 0 0 0    Assessment:  [name] verbally reports and visibly seems to feel much better than she did during previous [place] in home visits.  [name] is able to provide an accurate report of previous/ future provider appointments and verbalizes a good understanding of her current medications.  [name] continues to report excellent family support through her husband who is very involved in her care.  Ms. Browning remains a high fall risk, and fall prevention/education has been provided to patient.  Ms. Krysiak has verbalized today that she believes she is ready for discharge from [place].   Plan:   Will discharge [name] from [place] program, as she has requested, and will make her PCP aware.    [name], RN, BSN, [employer] [facility] Care Management  [phone]

## 2015-10-12 NOTE — Patient Outreach (Signed)
[facility]) Care Management  [facility] Social Work  10/12/2015  Samar Venneman 06-28-1970 [id number]  Subjective:   Patient is a 45 year old female Objective:   Encounter Medications:  Outpatient Encounter Prescriptions as of 10/12/2015  Medication Sig Note  . acetaminophen (TYLENOL) 500 MG tablet Take 1,000 mg by mouth every 6 (six) hours as needed for mild pain or headache.   . albuterol (PROVENTIL HFA;VENTOLIN HFA) 108 (90 BASE) MCG/ACT inhaler Inhale 2 puffs into the lungs every 6 (six) hours as needed for wheezing or shortness of breath.    Marland Kitchen aspirin EC 81 MG tablet Take 81 mg by mouth daily.   Marland Kitchen dicyclomine (BENTYL) 10 MG capsule Take 10 mg by mouth. Reported on 09/09/2015 08/30/2015: Patient stated that she has not been taking because she didn't think she needs it   . divalproex (DEPAKOTE) 500 MG DR tablet Take 1,000 mg by mouth 2 (two) times daily.    Marland Kitchen EPINEPHrine (EPIPEN 2-PAK) 0.3 mg/0.3 mL IJ SOAJ injection Inject 0.3 mg into the muscle once as needed (for severe allergic reaction). 09/09/2015: Only when needed  . FLUoxetine (PROZAC) 20 MG capsule Take 20 mg by mouth daily.   Marland Kitchen gabapentin (NEURONTIN) 300 MG capsule Take 300 mg by mouth 2 (two) times daily.   Marland Kitchen HYDROcodone-acetaminophen (NORCO) 5-325 MG tablet Take 1-2 tablets by mouth every 6 (six) hours as needed.   Marland Kitchen ketorolac (TORADOL) 10 MG tablet Take 1 tablet (10 mg total) by mouth every 8 (eight) hours as needed for moderate pain or severe pain. (Patient not taking: Reported on 08/30/2015) 08/30/2015: Patient states that she has not been taking because her insurance will not pay for it; states that she plans to ask her doctor to write the Rx for something else instead  . lacosamide (VIMPAT) 200 MG TABS tablet Take 1 tablet (200 mg total) by mouth 2 (two) times daily.   Marland Kitchen loratadine (CLARITIN) 10 MG tablet Take 10 mg by mouth daily. Reported on 09/09/2015 08/30/2015: Patient reports has not needed recently   .  meloxicam (MOBIC) 7.5 MG tablet Take 7.5-15 mg by mouth daily as needed for pain.  08/30/2015: Uses occasionally, patient states usually takes 2-3 times per week  . mirtazapine (REMERON) 15 MG tablet Take 15 mg by mouth at bedtime.   . Multiple Vitamin (MULTIVITAMIN) tablet Take 1 tablet by mouth daily.   . pantoprazole (PROTONIX) 40 MG tablet Take 40 mg by mouth daily. Reported on 08/03/2015   . promethazine (PHENERGAN) 25 MG tablet Take 25 mg by mouth every 6 (six) hours as needed for nausea or vomiting.   . topiramate (TOPAMAX) 25 MG tablet Take 2 tablets at bedtime for 30 days. Then take 1 tablet at bedtime for 30 days. (Patient taking differently: Take 50 mg by mouth 2 (two) times daily. Take 2 tablets at bedtime for 30 days. Then take 1 tablet at bedtime for 30 days.) 08/30/2015: New neurologist, Dr. Rosana Berger at [facility] has changed dosing; patient states she is taking 50 mg BID   No facility-administered encounter medications on file as of 10/12/2015.     Functional Status:  In your present state of health, do you have any difficulty performing the following activities: 08/30/2015 08/03/2015  Hearing? N N  Vision? Y Y  Difficulty concentrating or making decisions? Tempie Donning  Walking or climbing stairs? Y Y  Dressing or bathing? N Y  Doing errands, shopping? Tempie Donning  Preparing Food and eating ? N  N  Using the Toilet? N N  In the past six months, have you accidently leaked urine? Y Y  Do you have problems with loss of bowel control? N Y  Managing your Medications? Y Y  Managing your Finances? Tempie Donning  Housekeeping or managing your Housekeeping? Y N  Some recent data might be hidden    Fall/Depression Screening:  PHQ 2/9 Scores 09/09/2015 08/30/2015 08/03/2015 07/22/2015  PHQ - 2 Score 0 0 0 0    Assessment: Co-visit with RNCM Reginia Naas.  Patient in good spirits today. Patient active, up and walking around freely, without use of her cane. Patient agrees that she is doing much better.  Reports  that seizure study was completed at [facility] Epilepsy Monitoring Unit  10/04/15-10/08/15. Per patient, no seizure activity detected.   Patient  received letter from [facility] stating that patient's current , active medicaid does not qualify for [place].  Phone call to [facility])  patient's Medicaid looked up in [place] tracks showing that patient has Medicaid MQB, not full medicaid.  Phone call to [facility] worker(Cierra lawrence)  to discuss Medicaid  Coverage. Per  Layne Benton [phone], patient does not have full Medicaid however is covered by Medicaid MQB, which does not cover personal care services.  Patient, however doing well and can now take care of her own ADL's. Personal care Services not needed at this time.  Per patient, she continues to utilize Medicaid transportation as well as [employer] a ride for transportation to [employer]  RNCM reviewed medications, patient is managing her medications well,  schedules her own medical appointments.  Patient has plans to follow up with  mental health psychiatry based on her history of trauma.  Has plans to contact a psychiatrist that her spouse has seen in the past that was helpful.  Contact information for [place] [phone]  also given. Per patient, she can arrange this on her own and does not need this Education officer, museum assistance.  During the visit, patient had a brief episode where her face went blank, which seemed to resolve quickly without medical intervention.  Patient's case closure discussed, patient agrees to case closure as all of  her case management needs have been met  Plan: Patient's case to be closed to [facility] care management at this time.   [name] [facility] Care Management [phone]

## 2015-10-14 ENCOUNTER — Ambulatory Visit: Payer: Self-pay | Admitting: *Deleted

## 2015-10-18 DIAGNOSIS — F3341 Major depressive disorder, recurrent, in partial remission: Secondary | ICD-10-CM | POA: Insufficient documentation

## 2015-10-18 DIAGNOSIS — J4540 Moderate persistent asthma, uncomplicated: Secondary | ICD-10-CM | POA: Diagnosis not present

## 2015-10-18 DIAGNOSIS — R0683 Snoring: Secondary | ICD-10-CM | POA: Insufficient documentation

## 2015-10-18 DIAGNOSIS — J45909 Unspecified asthma, uncomplicated: Secondary | ICD-10-CM | POA: Insufficient documentation

## 2015-10-18 DIAGNOSIS — J309 Allergic rhinitis, unspecified: Secondary | ICD-10-CM | POA: Insufficient documentation

## 2015-10-18 DIAGNOSIS — J3089 Other allergic rhinitis: Secondary | ICD-10-CM | POA: Diagnosis not present

## 2015-10-18 DIAGNOSIS — IMO0002 Reserved for concepts with insufficient information to code with codable children: Secondary | ICD-10-CM | POA: Insufficient documentation

## 2015-10-18 DIAGNOSIS — E668 Other obesity: Secondary | ICD-10-CM | POA: Diagnosis not present

## 2015-10-26 ENCOUNTER — Encounter (HOSPITAL_COMMUNITY): Payer: Self-pay | Admitting: Emergency Medicine

## 2015-10-26 ENCOUNTER — Observation Stay (HOSPITAL_COMMUNITY)
Admission: EM | Admit: 2015-10-26 | Discharge: 2015-10-28 | Disposition: A | Discharge status: Home / Self Care | Payer: Commercial Managed Care - HMO | Attending: Oncology | Admitting: Oncology

## 2015-10-26 DIAGNOSIS — R4182 Altered mental status, unspecified: Secondary | ICD-10-CM | POA: Insufficient documentation

## 2015-10-26 DIAGNOSIS — E876 Hypokalemia: Secondary | ICD-10-CM | POA: Insufficient documentation

## 2015-10-26 DIAGNOSIS — G43909 Migraine, unspecified, not intractable, without status migrainosus: Secondary | ICD-10-CM | POA: Diagnosis not present

## 2015-10-26 DIAGNOSIS — Z79899 Other long term (current) drug therapy: Secondary | ICD-10-CM | POA: Insufficient documentation

## 2015-10-26 DIAGNOSIS — S0093XA Contusion of unspecified part of head, initial encounter: Secondary | ICD-10-CM | POA: Diagnosis not present

## 2015-10-26 DIAGNOSIS — Z8673 Personal history of transient ischemic attack (TIA), and cerebral infarction without residual deficits: Secondary | ICD-10-CM | POA: Diagnosis not present

## 2015-10-26 DIAGNOSIS — N3000 Acute cystitis without hematuria: Secondary | ICD-10-CM | POA: Diagnosis not present

## 2015-10-26 DIAGNOSIS — F329 Major depressive disorder, single episode, unspecified: Secondary | ICD-10-CM | POA: Diagnosis not present

## 2015-10-26 DIAGNOSIS — E872 Acidosis: Secondary | ICD-10-CM | POA: Diagnosis not present

## 2015-10-26 DIAGNOSIS — G4730 Sleep apnea, unspecified: Secondary | ICD-10-CM | POA: Diagnosis present

## 2015-10-26 DIAGNOSIS — F411 Generalized anxiety disorder: Secondary | ICD-10-CM | POA: Insufficient documentation

## 2015-10-26 DIAGNOSIS — F17200 Nicotine dependence, unspecified, uncomplicated: Secondary | ICD-10-CM | POA: Insufficient documentation

## 2015-10-26 DIAGNOSIS — K219 Gastro-esophageal reflux disease without esophagitis: Secondary | ICD-10-CM | POA: Diagnosis not present

## 2015-10-26 DIAGNOSIS — R102 Pelvic and perineal pain: Secondary | ICD-10-CM

## 2015-10-26 DIAGNOSIS — Z7982 Long term (current) use of aspirin: Secondary | ICD-10-CM | POA: Insufficient documentation

## 2015-10-26 DIAGNOSIS — R410 Disorientation, unspecified: Secondary | ICD-10-CM

## 2015-10-26 DIAGNOSIS — F445 Conversion disorder with seizures or convulsions: Principal | ICD-10-CM

## 2015-10-26 DIAGNOSIS — R569 Unspecified convulsions: Secondary | ICD-10-CM | POA: Diagnosis not present

## 2015-10-26 DIAGNOSIS — G4733 Obstructive sleep apnea (adult) (pediatric): Secondary | ICD-10-CM | POA: Insufficient documentation

## 2015-10-26 DIAGNOSIS — J449 Chronic obstructive pulmonary disease, unspecified: Secondary | ICD-10-CM | POA: Diagnosis not present

## 2015-10-26 DIAGNOSIS — S0990XA Unspecified injury of head, initial encounter: Secondary | ICD-10-CM | POA: Diagnosis not present

## 2015-10-26 HISTORY — DX: Other specified disorders of nose and nasal sinuses: J34.89

## 2015-10-26 LAB — CBC WITH DIFFERENTIAL/PLATELET
Basophils Absolute: 0 10*3/uL (ref 0.0–0.1)
Basophils Relative: 0 %
EOS ABS: 0.1 10*3/uL (ref 0.0–0.7)
Eosinophils Relative: 1 %
HEMATOCRIT: 35.5 % — AB (ref 36.0–46.0)
HEMOGLOBIN: 12.5 g/dL (ref 12.0–15.0)
LYMPHS ABS: 1.8 10*3/uL (ref 0.7–4.0)
Lymphocytes Relative: 37 %
MCH: 30.6 pg (ref 26.0–34.0)
MCHC: 35.2 g/dL (ref 30.0–36.0)
MCV: 86.8 fL (ref 78.0–100.0)
MONO ABS: 0.6 10*3/uL (ref 0.1–1.0)
MONOS PCT: 12 %
NEUTROS ABS: 2.4 10*3/uL (ref 1.7–7.7)
Neutrophils Relative %: 50 %
Platelets: 223 10*3/uL (ref 150–400)
RBC: 4.09 MIL/uL (ref 3.87–5.11)
RDW: 11.8 % (ref 11.5–15.5)
WBC: 4.9 10*3/uL (ref 4.0–10.5)

## 2015-10-26 LAB — URINALYSIS, ROUTINE W REFLEX MICROSCOPIC
Bilirubin Urine: NEGATIVE
GLUCOSE, UA: NEGATIVE mg/dL
HGB URINE DIPSTICK: NEGATIVE
Ketones, ur: NEGATIVE mg/dL
LEUKOCYTES UA: NEGATIVE
Nitrite: NEGATIVE
PH: 6 (ref 5.0–8.0)
Protein, ur: NEGATIVE mg/dL
Specific Gravity, Urine: 1.025 (ref 1.005–1.030)

## 2015-10-26 LAB — BASIC METABOLIC PANEL
ANION GAP: 7 (ref 5–15)
BUN: 10 mg/dL (ref 6–20)
CHLORIDE: 113 mmol/L — AB (ref 101–111)
CO2: 19 mmol/L — AB (ref 22–32)
Calcium: 9.1 mg/dL (ref 8.9–10.3)
Creatinine, Ser: 0.86 mg/dL (ref 0.44–1.00)
GFR calc Af Amer: >60 mL/min (ref >60)
GFR calc non Af Amer: >60 mL/min (ref >60)
GLUCOSE: 108 mg/dL — AB (ref 65–99)
POTASSIUM: 3.2 mmol/L — AB (ref 3.5–5.1)
Sodium: 139 mmol/L (ref 135–145)

## 2015-10-26 LAB — RAPID URINE DRUG SCREEN, HOSP PERFORMED
AMPHETAMINES: NOT DETECTED
BARBITURATES: NOT DETECTED
BENZODIAZEPINES: POSITIVE — AB
Cocaine: NOT DETECTED
Opiates: POSITIVE — AB
TETRAHYDROCANNABINOL: NOT DETECTED

## 2015-10-26 LAB — MAGNESIUM: Magnesium: 1.8 mg/dL (ref 1.7–2.4)

## 2015-10-26 LAB — I-STAT BETA HCG BLOOD, ED (MC, WL, AP ONLY): I-stat hCG, quantitative: <5 m[IU]/mL (ref <5)

## 2015-10-26 LAB — VALPROIC ACID LEVEL

## 2015-10-26 LAB — CBG MONITORING, ED: Glucose-Capillary: 102 mg/dL — ABNORMAL HIGH (ref 65–99)

## 2015-10-26 MED ORDER — LORAZEPAM 2 MG/ML IJ SOLN
INTRAMUSCULAR | Status: AC
  Administered 2015-10-26: 2 mg
  Filled 2015-10-26: qty 1

## 2015-10-26 MED ORDER — ENOXAPARIN SODIUM 40 MG/0.4ML ~~LOC~~ SOLN
40.0000 mg | SUBCUTANEOUS | Status: DC
  Administered 2015-10-26 – 2015-10-27 (×2): 40 mg via SUBCUTANEOUS
  Filled 2015-10-26 (×2): qty 0.4

## 2015-10-26 MED ORDER — POTASSIUM CHLORIDE IN NACL 40-0.9 MEQ/L-% IV SOLN
INTRAVENOUS | Status: AC
  Administered 2015-10-26: 75 mL/h via INTRAVENOUS
  Filled 2015-10-26: qty 1000

## 2015-10-26 MED ORDER — LORAZEPAM 2 MG/ML IJ SOLN
1.0000 mg | INTRAMUSCULAR | Status: DC | PRN
  Administered 2015-10-26: 1 mg via INTRAVENOUS
  Filled 2015-10-26: qty 1

## 2015-10-26 MED ORDER — SODIUM CHLORIDE 0.9 % IV SOLN
1000.0000 mg | Freq: Once | INTRAVENOUS | Status: AC
  Administered 2015-10-26: 1000 mg via INTRAVENOUS
  Filled 2015-10-26: qty 10

## 2015-10-26 MED ORDER — SODIUM CHLORIDE 0.9 % IV SOLN
75.0000 mL/h | INTRAVENOUS | Status: DC
  Administered 2015-10-26: 75 mL/h via INTRAVENOUS

## 2015-10-26 NOTE — Progress Notes (Signed)
Pt experiencing seizure activity while RN in room. Pt convulsing while holding breath. AD Anderson Malta instructed pt to breathe and pt let breath out and began breathing very quickly. Pt's jaw remained stiff while left limb was lose. RN administered 1 mg of Ativan in pt's IV. Suction was administered to keep airway open. After few minutes pt responded to RN's presence and began talking again.

## 2015-10-26 NOTE — H&P (Signed)
Date: 10/26/2015               Patient Name:  [name] MRN: [id number]  DOB: [birth year] Age / Sex: 45 y.o., female   PCP: Glendon Axe, MD         Medical Service: Internal Medicine Teaching Service         Attending Physician: Dr. Charlesetta Shanks, MD    First Contact: Pershing Cox, MS4 Pager: [phone]  Second Contact: Dr. Charlott Rakes Pager: [phone]       After Hours (After 5p/  First Contact Pager: [phone]  weekends / holidays): Second Contact Pager: [phone]   Chief Complaint: Seizure  History of Present Illness: [name] is a 45 year old female with seizure disorder, anxiety/depression, COPD, GERD, migraines, chronic neck pain who presented to the emergency department with one-day history of seizure-like activity. As patient was unable to answer all questions and her husband was unavailable by phone, history was collected through chart review, including Care Everywhere.  Her seizure disorder began 7 years ago and has been worked up extensively. Triggers include altered smell which she has described as alcohol. Per her husband, typical episodes last for 45-90 seconds and are associated with rigidity, arching of the back, shaking of upper and lower extremities, apnea. On 07/12/13, she underwent 2-day stay in the epilepsy monitoring unit at [facility] and was found to have seizure-like activity without clear EEG correlates. On 10/04/15-10/08/15, she was admitted to the epilepsy monitoring unit at [facility] after which she was started on a taper of her antiepileptic regimen: lacosamide and valproate. She has only been continued on topiramate for her migraines and gabapentin for chronic pain.  In route to the hospital, EMS noted for episodes of seizure-like activity, each lasting 1 minute. In the emergency department, she had additional episodes for which she lorazepam IV and Keppra IV. Neurology was consulted and recommended psychiatric consultation should she not return to her  baseline.  Meds:  No outpatient prescriptions have been marked as taking for the 10/26/15 encounter [facility] Encounter).   Per PCP's last office visit 10/18/15 available in Care Everywhere -Albuterol sulfate 90 mcg/actuation every 4 (four) hours. -Aspirin 81 MG EC tablet, Take 81 mg by mouth once daily. -Dicyclomine (BENTYL) 10 mg capsule, Take 1 capsule (10 mg total) by mouth 4 (four) times daily before meals and nightly. -FLUoxetine (PROZAC) 20 MG capsule, TAKE ONE CAPSULE BY MOUTH ONCE DAILY -Gabapentin (NEURONTIN) 300 MG capsule, Take 1 capsule (300 mg total) by mouth 2 (two) times daily. -Loratadine (CLARITIN) 10 mg capsule, Take 10 mg by mouth once daily., Taking -Meloxicam (MOBIC) 7.5 MG tablet, Take 1-2 tablets daily as needed for neck pain, Taking -Mirtazapine (REMERON) 15 MG tablet, Take 1 tablet by mouth nightly., Taking Promethazine (PHENERGAN) 25 MG tablet, TAKE ONE TABLET BY MOUTH 4 TIMES DAILY AS NEEDED FOR NAUSEA, Taking -Topiramate (TOPAMAX) 50 MG tablet, Take 1 tablet (50 mg total) by mouth once daily. (Patient taking differently: Take 50 mg by mouth 2 (two) times daily. ), Taking -Fluticasone (FLOVENT HFA) 110 mcg/actuation inhaler, Inhale 1 inhalation into the lungs 2 (two) times daily. -Multivitamin tablet, Take 1 tablet by mouth once daily. -Orlistat (XENICAL) 120 MG capsule, Take 1 capsule (120 mg total) by mouth 3 (three) times daily with meals.    Allergies: Allergies as of 10/26/2015 - Review Complete 10/26/2015  Allergen Reaction Noted  . Bee venom Anaphylaxis 05/15/2014  . Shellfish allergy Anaphylaxis 05/15/2014  . Contrast media [iodinated diagnostic  agents] Other (See Comments) 08/07/2013  . Penicillins Other (See Comments) 08/07/2013  . Sulfa antibiotics Itching 05/15/2014   Past Medical History:  Diagnosis Date  . Anxiety   . Arthritis   . Asthma   . Bilateral shoulder pain   . Chronic neck pain   . Depression   . Emphysema/COPD ([place])   . GERD  (gastroesophageal reflux disease)   . Migraines   . Nasal septal perforation   . Seizure ([place])   . Sleep apnea   . Stroke [facility])     Family History: Unable to collect due to patient's mental status  Social History: Lives at home with husband. Enjoys fishing as a Water engineer.  Review of Systems: A complete ROS was negative except as per HPI albeit limited for the patient's mental status.  Physical Exam: Blood pressure 105/77, pulse 94, temperature 99.3 F (37.4 C), resp. rate 13, height 5\' 3"  (1.6 m), weight 161 lb (73 kg), SpO2 100 %. Physical Exam  Constitutional: No distress.  HENT:  Head: Normocephalic and atraumatic.  Eyes: Conjunctivae are normal. No scleral icterus.  Cardiovascular: Normal rate and regular rhythm.   Pulmonary/Chest: Effort normal. No respiratory distress.  Abdominal: Soft. Bowel sounds are normal.  Moaning elicited with some palpation though not localizable.  Neurological:  Unable to complete a full cranial nerve exam due to patient effort. Twitching of the eyelids while asleep suggestive of eye-movement. 2+ patellar reflexes. Able to lift upper and lower extremities on command.  Skin: Skin is warm and dry. She is not diaphoretic.  Tattoo noted of the left lower extremity   EKG: Sinus tachycardia, normal axis  Assessment & Plan by Problem: Principal Problem:   Psychogenic nonepileptic seizure Active Problems:   COPD (chronic obstructive pulmonary disease) (HCC)   Sleep apnea   Migraines  [name] is a 45 year old female with seizure disorder, anxiety/depression, COPD, GERD, migraines, chronic neck pain hospitalized for one-day history of seizure-like activity found to have hypokalemia and metabolic acidosis.  Seizure-like activity: Non-epileptiform in nature as suggested by extensive workup thus far per review of the chart and per last office visit with outpatient neurologist, Dr. Brett Fairy. Most recent neuroimaging is head CT 06/22/15 which did not  show lesions which could account for her activity. Labwork on admission reassuring for no metabolic or infectious etiologies. Unclear of recent stressors which may have triggered her current admission. Current management is limited to nonpharmacologic modalities. -Admit to telemetry with seizure precautions -Reassure patient should she have additional seizure-like activity tonight and avoid any sedatives or antiepileptics -Consult psychiatry tomorrow morning -Order neurochecks every 4 hours -Admit to camera room -Hold oral medications and diet until she is more awake  Hypokalemia: Check Mg and switch to NS + KCl 31mEq @ 75cc/hr x 12 hours.   Metabolic acidosis: Bicarb 19 on admission. Likely 2/2 topiramate.   GERD: Holding pantoprazole as noted above.  COPD: No PFTs on file. Continue albuterol nebulizer as needed for wheezing.  Migraines: Holding topiramate as noted above.  Anxiety/depression: Holding mirtazapine as noted above.  #FEN:  -Diet: Nothing by mouth until she is more awake  #DVT prophylaxis: Lovenox  #CODE STATUS: FULL CODE [UNCONFIRMED]  Dispo: Admit patient to Observation with expected length of stay less than 2 midnights.  Signed: Riccardo Dubin, MD 10/26/2015, 3:55 PM  Pager: @[phone] @

## 2015-10-26 NOTE — ED Notes (Signed)
MD at bedside. Pfeiffer

## 2015-10-26 NOTE — H&P (Signed)
Date: 10/26/2015               Patient Name:  [name] MRN: [id number]  DOB: 1970-03-15 Age / Sex: 45 y.o., female   PCP: Glendon Axe, MD              Medical Service: Internal Medicine Teaching Service              Attending Physician: Dr. Charlesetta Shanks, MD    First Contact: Pershing Cox, MS4 Pager: [phone]  Second Contact: Dr. Charlott Rakes Pager: [phone]            After Hours (After 5p/  First Contact Pager: [phone]  weekends / holidays): Second Contact Pager: [phone]   Chief Complaint: multiple seizures  History of Present Illness: [name] is a 45 yo female with medical history significant for pseudoseizures, anxiety, depression, COPD, migraines, chronic neck pain, and GERD who presents to [facility] Emergency Department with seizure-like activity x 1 day. [name] has no recollection of the events of today, neither before nor after her seizures. She notes her most recent episode was about 1 week ago. The patient was somnolent and unable to answer the team's questions, spouse who is the emergency contact was unable to be reached. Therefore, most of the history was obtained through chart review.  [name] began having seizures 7 years ago and has undergone extensive work-up to diagnose their etiology. An epilepsy monitoring unit evaluation earlier this month found no abnormal brain activity and confirmed pseudoseizures that had been previously diagnosed with multiple 24 and 72 hour EEG monitoring evaluations in 2015 and 2016. Prior head CT's have been unremarkable for abnormalities that could account for her seizures. She has been on multiple anti-epileptics, including Lamictal, Keppra, and most recently Depakote and Vimpat. Vimpat and Depakote were weaned this past month and she is currently only taking Topiramate and Gabapentin. Her husband has historically described her episodes as lasting 45-90 seconds in which the patient goes rigid and stops breathing.    On route to the hospital, patient had 4 witnessed seizures by EMS, lasting about 1 minute each. She had several additional seizures witnessed by her nurse in the ED. She received 1 dose of midazolam in ambulance and 1 dose Ativan in ED. Neurology consulted and patient was admitted to the Internal Medicine Teaching Service for further work-up of her seizure-like activity.  Meds: Current Outpatient Prescriptions  Medication Sig Dispense Refill  . acetaminophen (TYLENOL) 500 MG tablet Take 1,000 mg by mouth every 6 (six) hours as needed for mild pain or headache.    . albuterol (PROVENTIL HFA;VENTOLIN HFA) 108 (90 BASE) MCG/ACT inhaler Inhale 2 puffs into the lungs every 6 (six) hours as needed for wheezing or shortness of breath.     Marland Kitchen aspirin EC 81 MG tablet Take 81 mg by mouth daily.    Marland Kitchen dicyclomine (BENTYL) 10 MG capsule Take 10 mg by mouth. Reported on 09/09/2015    . EPINEPHrine (EPIPEN 2-PAK) 0.3 mg/0.3 mL IJ SOAJ injection Inject 0.3 mg into the muscle once as needed (for severe allergic reaction).    Marland Kitchen FLUoxetine (PROZAC) 20 MG capsule Take 20 mg by mouth daily.    Marland Kitchen gabapentin (NEURONTIN) 300 MG capsule Take 300 mg by mouth 2 (two) times daily.    Marland Kitchen HYDROcodone-acetaminophen (NORCO) 5-325 MG tablet Take 1-2 tablets by mouth every 6 (six) hours as needed. 12 tablet 0  . ketorolac (TORADOL) 10 MG tablet Take 1 tablet (10  mg total) by mouth every 8 (eight) hours as needed for moderate pain or severe pain. (Patient not taking: Reported on 08/30/2015) 20 tablet 0  . loratadine (CLARITIN) 10 MG tablet Take 10 mg by mouth daily. Reported on 09/09/2015    . meloxicam (MOBIC) 7.5 MG tablet Take 7.5-15 mg by mouth daily as needed for pain.     . mirtazapine (REMERON) 15 MG tablet Take 15 mg by mouth at bedtime.    . Multiple Vitamin (MULTIVITAMIN) tablet Take 1 tablet by mouth daily.    . pantoprazole (PROTONIX) 40 MG tablet Take 40 mg by mouth daily. Reported on 08/03/2015    . promethazine  (PHENERGAN) 25 MG tablet Take 25 mg by mouth every 6 (six) hours as needed for nausea or vomiting.    . topiramate (TOPAMAX) 25 MG tablet Take 2 tablets at bedtime for 30 days. Then take 1 tablet at bedtime for 30 days. (Patient taking differently: Take 50 mg by mouth 2 (two) times daily. Take 2 tablets at bedtime for 30 days. Then take 1 tablet at bedtime for 30 days.) 90 tablet 0    Allergies: Allergies as of 10/26/2015 - Review Complete 10/26/2015  Allergen Reaction Noted  . Bee venom Anaphylaxis 05/15/2014  . Shellfish allergy Anaphylaxis 05/15/2014  . Contrast media [iodinated diagnostic agents] Other (See Comments) 08/07/2013  . Penicillins Other (See Comments) 08/07/2013  . Sulfa antibiotics Itching 05/15/2014   Past Medical History:  Diagnosis Date  . Anxiety   . Arthritis   . Asthma   . Bilateral shoulder pain   . Chronic neck pain   . Depression   . Emphysema/COPD ([place])   . GERD (gastroesophageal reflux disease)   . Migraines   . Nasal septal perforation   . Seizure ([place])   . Sleep apnea   . Stroke [facility])    Past Surgical History:  Procedure Laterality Date  . ABDOMINAL HYSTERECTOMY     partial  . CESAREAN SECTION  1993  . PARTIAL HYSTERECTOMY  1995  . TONSILLECTOMY     Family History  Problem Relation Age of Onset  . Depression Maternal Grandmother   . Hypertension Maternal Grandmother   . Cancer Maternal Grandfather   . Lung cancer Mother   . Depression Mother   . Learning disabilities Sister   . Depression Sister   . Depression Paternal Uncle   . Hypertension Paternal Uncle   . Diabetes Neg Hx    Social History   Social History  . Marital status: Married    Spouse name: N/A  . Number of children: N/A  . Years of education: N/A   Occupational History  . Not on file.   Social History Main Topics  . Smoking status: Current Every Day Smoker  . Smokeless tobacco: Never Used  . Alcohol use No  . Drug use: No  . Sexual activity: Not on file    Social History Narrative   Patient is married and lives at home with her husband.     Review of Systems: Review of systems not obtained due to patient factors.  Physical Exam: Blood pressure 105/77, pulse 94, temperature 99.3 F (37.4 C), resp. rate 13, height '5\' 3"'$  (1.6 m), weight 73 kg (161 lb), SpO2 100 %. General appearance: somnolent, oriented to self, moderately obese female in NAD HEENT: Normocephalic. + non-bleeding scrapes on right temple and cheek from fall. Sclera and conjunctiva non-icteric. Nares clear. Hearing grossly intact. No cervical lymphadenopathy. Lungs: nasal cannula in place.  Normal WOB. Unable to perform lung exam due to patient positioning Heart: RRR, no murmurs, rubs, or gallops Abdomen: Mildly tender to palpation LUQ/LLQ, non-distended. BS+ Extremities: Warm and well-perfused. No edema. Pulses 2+ bilat in DP Neurologic: Follows motor commands. Slurred speech. PERRL. No facial droop or weakness appreciated. Strength 4/5 upper extremities bilaterally. Patient able to lift all extremities against gravity. Difficult to assess strength in lower extremities due to somnolence.  Lab results: Component     Latest Ref Rng & Units 10/26/2015  WBC     4.0 - 10.5 K/uL 4.9  RBC     3.87 - 5.11 MIL/uL 4.09  Hemoglobin     12.0 - 15.0 g/dL 12.5  HCT     36.0 - 46.0 % 35.5 (L)  MCV     78.0 - 100.0 fL 86.8  MCH     26.0 - 34.0 pg 30.6  MCHC     30.0 - 36.0 g/dL 35.2  RDW     11.5 - 15.5 % 11.8  Platelets     150 - 400 K/uL 223  Neutrophils     % 50  NEUT#     1.7 - 7.7 K/uL 2.4  Lymphocytes     % 37  Lymphocyte #     0.7 - 4.0 K/uL 1.8  Monocytes Relative     % 12  Monocyte #     0.1 - 1.0 K/uL 0.6  Eosinophil     % 1  Eosinophils Absolute     0.0 - 0.7 K/uL 0.1  Basophil     % 0  Basophils Absolute     0.0 - 0.1 K/uL 0.0  Sodium     135 - 145 mmol/L 139  Potassium     3.5 - 5.1 mmol/L 3.2 (L)  Chloride     101 - 111 mmol/L 113 (H)  CO2      22 - 32 mmol/L 19 (L)  Glucose     65 - 99 mg/dL 108 (H)  BUN     6 - 20 mg/dL 10  Creatinine     0.44 - 1.00 mg/dL 0.86  Calcium     8.9 - 10.3 mg/dL 9.1  EGFR (Non-African Amer.)     >60 mL/min >60  EGFR (African American)     >60 mL/min >60  Anion gap     5 - 15 7  Color, Urine     YELLOW YELLOW  Appearance     CLEAR CLEAR  Specific Gravity, Urine     1.005 - 1.030 1.025  pH     5.0 - 8.0 6.0  Glucose     NEGATIVE mg/dL NEGATIVE  Hgb urine dipstick     NEGATIVE NEGATIVE  Bilirubin Urine     NEGATIVE NEGATIVE  Ketones, ur     NEGATIVE mg/dL NEGATIVE  Protein     NEGATIVE mg/dL NEGATIVE  Nitrite     NEGATIVE NEGATIVE  Leukocytes, UA     NEGATIVE NEGATIVE  Opiates     NONE DETECTED POSITIVE (A)  COCAINE     NONE DETECTED NONE DETECTED  Benzodiazepines     NONE DETECTED POSITIVE (A)  Amphetamines     NONE DETECTED NONE DETECTED  Tetrahydrocannabinol     NONE DETECTED NONE DETECTED  Barbiturates     NONE DETECTED NONE DETECTED  I-stat hCG, quantitative     <5 mIU/mL <5.0  Comment 3        Valproic Acid,S  50.0 - 100.0 ug/mL <10 (L)  Magnesium     1.7 - 2.4 mg/dL 1.8   Imaging results:  No results found.  Other results: EKG: sinus tachycardia, borderline long QT interval (QTc 496), baseline wander in III and aVL  Assessment & Plan by Problem: Ms. Pho is a 45 yo female with a PMHx significant for pseudoseizures, anxiety, depression, chronic neck pain, migraines, COPD, and GERD who presents with recurrent seizure-like activity x 1 day.  Seizure-like activity: likely pseudoseizures. Difficult to assess for post-ictal state given somnolence during exam, but no reported urine/bowel incontinence or tongue biting. She has undergone an extensive epileptic work-up with no abnormal EEG findings. Her outpatient neurologist has been weaning her off anti-epileptic medications as she has continued to have recurrent seizure-like activity while on them.  Neurology evaluated patient in the ED and recommend psychiatry consult if patient's symptoms do not improve overnight. Discussed her case with the neurologist after our evaluation: he recommends against giving ativan or additional anti-epileptics for seizure control while in the hospital. Attempted to get in touch with husband who is listed as emergency contact but was unable to reach him. Will attempt again tomorrow to identify underlying reason for her more frequent episodes.  --continue home gabapentin and topiramate  --observe patient overnight for recurrent seizure-like activity  --cardiac monitoring  --neuro checks q4h  --seizure precautions  --avoid sedatives  --consider psych consult in the am  Abdominal pain: Mild with no concerning signs on exam, unclear etiology. WBC 4.9, UA shows no signs of infection. CT abd/pelvis last month found no acute pathology, stable 76m non-obstructing left renal stone, stable rim calcified splenic lesion. Continue home pantoprazole and re-evaluate when patient can provide history.  Anxiety/depression: Consider inpatient psych consult. Continue home Fluoxetine.  COPD: Continue albuterol  GERD: Continue home pantoprazole  FEN/GI: Regular diet  DVT ppx: Lovenox  Code Status: FULL by default (patient unable to provide clear directives)  Disposition: Admit to Observation. Anticipate stay 1-2 days.   This is a MCareers information officerNote.  The care of the patient was discussed with Dr. PPosey Prontoand the assessment and plan was formulated with their assistance.  Please see their note for official documentation of the patient encounter.   Signed: TPershing Cox Medical Student 10/26/2015, 3:55 PM

## 2015-10-26 NOTE — ED Notes (Signed)
Patient is resting quietly, responds to voice, patient is moderately confused when aroused and then comes around, denies any pain

## 2015-10-26 NOTE — Care Management Obs Status (Signed)
[facility] NOTIFICATION   Patient Details  Name: [name] MRN: [id number] Date of Birth: 1970-09-17   Medicare Observation Status Notification Given:  Yes CM spoke to patient at the bedside and delivered and explained [place] letter. Patient verbalized understanding and said that there were no questions. Patient signed [facility] letter and was given a copy.     Guido Sander, RN 10/26/2015, 9:11 PM

## 2015-10-26 NOTE — ED Provider Notes (Signed)
[facility] DEPT Provider Note   CSN: [id number] Arrival date & time: 10/26/15  1016     History   Chief Complaint Chief Complaint  Patient presents with  . Seizures    HPI [name] is a 45 y.o. female.  HPI Patient reports that she found herself on the floor this morning and had a hematoma to the right side of her forehead. She reports that she has seizures and most likely had a seizure today. On arrival, patient had reportedly had 4 seizures on route witnessed by EMS. Each lasted approximately 1 minute. Additional history from the patient's husband is that she has had frequent seizures for approximately 7 years now. At times they've been very difficult to control and she may have several per day. Sometimes he reports she hits her head and has abrasions or contusions. He reports he is concerned because often he is at work and he worries that she will get a head injury and there'll be no one there to help her. The patient has been diagnosed with pseudoseizures and was taken off Vimpat and Depakote within the past month. Her husband reports she seemed to do better when she was on the Vimpat. Her only antiseizure medication now is Topamax and gabapentin. Past Medical History:  Diagnosis Date  . Anxiety   . Arthritis   . Asthma   . Bilateral shoulder pain   . Chronic neck pain   . Depression   . Emphysema/COPD ([place])   . GERD (gastroesophageal reflux disease)   . Migraines   . Nasal septal perforation   . Seizure ([place])   . Sleep apnea   . Stroke [facility])     Patient Active Problem List   Diagnosis Date Noted  . Change in mental status 10/26/2015  . Spells of speech arrest 08/10/2015  . Shaking spells 08/10/2015  . Apneic spell 08/10/2015  . Pseudoseizure ([place])   . Status epilepticus due to refractory epilepsy ([place]) 06/22/2015  . Pseudoseizures 05/11/2015  . Migraines 05/11/2015  . Abdominal pain 05/10/2015  . Seizures ([place]) 05/17/2014  . Respiratory failure, acute  ([place]) 05/16/2014  . COPD (chronic obstructive pulmonary disease) ([place])   . Emphysema/COPD ([place])   . Sleep apnea   . Stroke [facility])     Past Surgical History:  Procedure Laterality Date  . ABDOMINAL HYSTERECTOMY     partial  . CESAREAN SECTION  1993  . PARTIAL HYSTERECTOMY  1995  . TONSILLECTOMY      OB History    No data available       Home Medications    Prior to Admission medications   Medication Sig Start Date End Date Taking? Authorizing Provider  acetaminophen (TYLENOL) 500 MG tablet Take 1,000 mg by mouth every 6 (six) hours as needed for mild pain or headache.    Historical Provider, MD  albuterol (PROVENTIL HFA;VENTOLIN HFA) 108 (90 BASE) MCG/ACT inhaler Inhale 2 puffs into the lungs every 6 (six) hours as needed for wheezing or shortness of breath.     Historical Provider, MD  aspirin EC 81 MG tablet Take 81 mg by mouth daily.    Historical Provider, MD  dicyclomine (BENTYL) 10 MG capsule Take 10 mg by mouth. Reported on 09/09/2015 07/14/15 07/13/16  Historical Provider, MD  divalproex (DEPAKOTE) 500 MG DR tablet Take 1,000 mg by mouth 2 (two) times daily.     Historical Provider, MD  EPINEPHrine (EPIPEN 2-PAK) 0.3 mg/0.3 mL IJ SOAJ injection Inject 0.3 mg into the muscle  once as needed (for severe allergic reaction).    Historical Provider, MD  FLUoxetine (PROZAC) 20 MG capsule Take 20 mg by mouth daily.    Historical Provider, MD  gabapentin (NEURONTIN) 300 MG capsule Take 300 mg by mouth 2 (two) times daily.    Historical Provider, MD  HYDROcodone-acetaminophen (NORCO) 5-325 MG tablet Take 1-2 tablets by mouth every 6 (six) hours as needed. 09/27/15   [name], MD  ketorolac (TORADOL) 10 MG tablet Take 1 tablet (10 mg total) by mouth every 8 (eight) hours as needed for moderate pain or severe pain. Patient not taking: Reported on 08/30/2015 06/27/15   Lytle Butte, MD  lacosamide (VIMPAT) 200 MG TABS tablet Take 1 tablet (200 mg total) by mouth 2 (two) times  daily. Patient not taking: Reported on 10/12/2015 05/11/15   Fritzi Mandes, MD  loratadine (CLARITIN) 10 MG tablet Take 10 mg by mouth daily. Reported on 09/09/2015    Historical Provider, MD  meloxicam (MOBIC) 7.5 MG tablet Take 7.5-15 mg by mouth daily as needed for pain.     Historical Provider, MD  mirtazapine (REMERON) 15 MG tablet Take 15 mg by mouth at bedtime.    Historical Provider, MD  Multiple Vitamin (MULTIVITAMIN) tablet Take 1 tablet by mouth daily.    Historical Provider, MD  pantoprazole (PROTONIX) 40 MG tablet Take 40 mg by mouth daily. Reported on 08/03/2015    Historical Provider, MD  promethazine (PHENERGAN) 25 MG tablet Take 25 mg by mouth every 6 (six) hours as needed for nausea or vomiting.    Historical Provider, MD  topiramate (TOPAMAX) 25 MG tablet Take 2 tablets at bedtime for 30 days. Then take 1 tablet at bedtime for 30 days. Patient taking differently: Take 50 mg by mouth 2 (two) times daily. Take 2 tablets at bedtime for 30 days. Then take 1 tablet at bedtime for 30 days. 08/23/15   Larey Seat, MD    Family History Family History  Problem Relation Age of Onset  . Depression Maternal Grandmother   . Hypertension Maternal Grandmother   . Cancer Maternal Grandfather   . Lung cancer Mother   . Depression Mother   . Learning disabilities Sister   . Depression Sister   . Depression Paternal Uncle   . Hypertension Paternal Uncle   . Diabetes Neg Hx     Social History Social History  Substance Use Topics  . Smoking status: Current Every Day Smoker  . Smokeless tobacco: Never Used  . Alcohol use No     Allergies   Bee venom; Shellfish allergy; Contrast media [iodinated diagnostic agents]; Penicillins; and Sulfa antibiotics   Review of Systems Review of Systems 10 Systems reviewed and are negative for acute change except as noted in the HPI.   Physical Exam Updated Vital Signs BP 105/77   Pulse 94   Temp 99.3 F (37.4 C)   Resp 13   Ht 5\' 3"  (1.6 m)    Wt 161 lb (73 kg)   LMP  (LMP Unknown)   SpO2 100%   BMI 28.52 kg/m   Physical Exam  Constitutional: She appears well-developed and well-nourished. No distress.  Arrival patient is having tonic-clonic activity. She is pale in appearance.  HENT:  Right Ear: External ear normal.  Left Ear: External ear normal.  Nose: Nose normal.  Mouth/Throat: Oropharynx is clear and moist.  Patient has approximately 2 cm hematoma to right forehead. Superficial abrasion to right zygoma. No other facial swelling. No  crepitus or bony instability.  Eyes: Conjunctivae and EOM are normal. Pupils are equal, round, and reactive to light.  Neck: Neck supple.  Cardiovascular: Normal rate and regular rhythm.   No murmur heard. Pulmonary/Chest: Effort normal and breath sounds normal. No respiratory distress.  Abdominal: Soft. There is no tenderness.  Musculoskeletal: She exhibits no edema.  Neurological:  On arrival patient was lethargic. She did exhibit tonic-clonic activity. After this. She did have a period of improved lucidity answering simple questions. She went through several additional episodes of tonic-clonic activity which resolved after approximately 30 seconds. An episode she was able to perform symmetric grip strength is symmetric extension and flexion of the feet.  Skin: Skin is warm and dry.  Psychiatric:  Patient's intermittently slightly somnolent.  Nursing note and vitals reviewed.    ED Treatments / Results  Labs (all labs ordered are listed, but only abnormal results are displayed) Labs Reviewed  BASIC METABOLIC PANEL - Abnormal; Notable for the following:       Result Value   Potassium 3.2 (*)    Chloride 113 (*)    CO2 19 (*)    Glucose, Bld 108 (*)    All other components within normal limits  CBC WITH DIFFERENTIAL/PLATELET - Abnormal; Notable for the following:    HCT 35.5 (*)    All other components within normal limits  URINE RAPID DRUG SCREEN, HOSP PERFORMED - Abnormal;  Notable for the following:    Opiates POSITIVE (*)    Benzodiazepines POSITIVE (*)    All other components within normal limits  VALPROIC ACID LEVEL - Abnormal; Notable for the following:    Valproic Acid Lvl <10 (*)    All other components within normal limits  CBG MONITORING, ED - Abnormal; Notable for the following:    Glucose-Capillary 102 (*)    All other components within normal limits  URINALYSIS, ROUTINE W REFLEX MICROSCOPIC (NOT AT [facility])  MAGNESIUM  I-STAT BETA HCG BLOOD, ED ([facility], [facility], [facility] ONLY)    EKG  EKG Interpretation None       Radiology No results found.  Procedures Procedures (including critical care time) CRITICAL CARE Performed by: Charlesetta Shanks   Total critical care time:30 minutes  Critical care time was exclusive of separately billable procedures and treating other patients.  Critical care was necessary to treat or prevent imminent or life-threatening deterioration.  Critical care was time spent personally by me on the following activities: development of treatment plan with patient and/or surrogate as well as nursing, discussions with consultants, evaluation of patient's response to treatment, examination of patient, obtaining history from patient or surrogate, ordering and performing treatments and interventions, ordering and review of laboratory studies, ordering and review of radiographic studies, pulse oximetry and re-evaluation of patient's condition. Medications Ordered in ED Medications  0.9 %  sodium chloride infusion (75 mL/hr Intravenous New Bag/Given 10/26/15 1104)  LORazepam (ATIVAN) injection 1-2 mg (not administered)  LORazepam (ATIVAN) 2 MG/ML injection (2 mg  Given 10/26/15 1033)  levETIRAcetam (KEPPRA) 1,000 mg in sodium chloride 0.9 % 100 mL IVPB (0 mg Intravenous Stopped 10/26/15 1201)     Initial Impression / Assessment and Plan / ED Course  I have reviewed the triage vital signs and the nursing notes.  Pertinent labs & imaging  results that were available during my care of the patient were reviewed by me and considered in my medical decision making (see chart for details).  Clinical Course   Consult neurology: Dr. Tasia Catchings (10:36)  advises to follow epilepsy protocol using Ativan and Keppra load. Patient will be evaluated by neurology in the emergency department. Discussed with Dr. Tasia Catchings. At this time we will not proceed with CT head. Patient has had a number of CT head exams and based on neurologic exam between episodes of seizure-like activity, doubt intracranial traumatic injury. At this time, it appears appropriate to try to minimize recurrent radiation exposure with frequent CT scan. Final Clinical Impressions(s) / ED Diagnoses   Final diagnoses:  Pseudoseizure [facility])  Disorientation  Patient was treated with Keppra and Ativan. Frequency of seizure activity improved. Patient however remained somnolent and did not return to baseline speech and function. This has the appearance of possible post ictal type state or sedation from Ativan. At this time, she will be placed in observation that she has not adequately returned to baseline function for safe discharge.  New Prescriptions New Prescriptions   No medications on file     Charlesetta Shanks, MD 10/26/15 [phone]

## 2015-10-26 NOTE — ED Triage Notes (Signed)
Pt arrives by ems from home by woke up this am on floor with hematoma to right side of forearm beside right eye and cheek bone. Pt had 4 witnessed seizures en route and on on arrival to ED. Lasting approx.1 minute. Pt alert and ox4 at this time. Ems gave 5mg  Midazolam and 4mg  Zofran.

## 2015-10-26 NOTE — Consult Note (Signed)
Initial Neurological Consultation                      NEURO HOSPITALIST CONSULT NOTE   Requestig physician: Dr. Johnney Killian   Reason for Consult:  Seizure-like activity.   HPI:                                                                                                                                          [name] is an 45 y.o. female who presents with a history of several episodes of seizure-like activity. The exact nature of the activity is not entirely clear but appears to be tonic-clonic in nature. There is no tongue biting or incontinence.  [name]'s file has been reviewed. She has been followed by neurology many times in the past. It appears she has been for at least 1 or 2 epilepsy monitoring unit evaluations. During these evaluations it seems that there was a strong suspicion for non-epileptic events. There is also a note from the past year in which she was admitted to the hospital and was on long-term monitoring. During this period the notes state that she had seizure-like activity with no clear EEG correlate. The seizures have not been under control with the use of any standard antiepileptic medications.  [name] was asked about the onset, frequency, duration, and semiology of these events. She simply stated that she was unable to remember any details.    Past Medical History:  Diagnosis Date  . Anxiety   . Arthritis   . Asthma   . Bilateral shoulder pain   . Chronic neck pain   . Depression   . Emphysema/COPD ([place])   . GERD (gastroesophageal reflux disease)   . Migraines   . Nasal septal perforation   . Seizure ([place])   . Sleep apnea   . Stroke [facility])     Past Surgical History:  Procedure Laterality Date  . ABDOMINAL HYSTERECTOMY     partial  . CESAREAN SECTION  1993  . PARTIAL HYSTERECTOMY  1995  . TONSILLECTOMY      MEDICATIONS:                                                                                                                     I  have reviewed the patient's current medications.  Allergies  Allergen Reactions  . Bee Venom Anaphylaxis  . Shellfish Allergy  Anaphylaxis  . Contrast Media [Iodinated Diagnostic Agents] Other (See Comments)    Reaction:  Unknown   . Penicillins Other (See Comments)    Reaction:  Seizures  Has patient had a PCN reaction causing immediate rash, facial/tongue/throat swelling, SOB or lightheadedness with hypotension: No Has patient had a PCN reaction causing severe rash involving mucus membranes or skin necrosis: No Has patient had a PCN reaction that required hospitalization Yes Has patient had a PCN reaction occurring within the last 10 years: Yes If all of the above answers are "NO", then may proceed with Cephalosporin use.  . Sulfa Antibiotics Itching     Social History:  reports that she has been smoking.  She has never used smokeless tobacco. She reports that she does not drink alcohol or use drugs.  Family History  Problem Relation Age of Onset  . Depression Maternal Grandmother   . Hypertension Maternal Grandmother   . Cancer Maternal Grandfather   . Lung cancer Mother   . Depression Mother   . Learning disabilities Sister   . Depression Sister   . Depression Paternal Uncle   . Hypertension Paternal Uncle   . Diabetes Neg Hx      ROS:                                                                                                                                       History obtained from chart review  General ROS: negative for - chills, fatigue, fever, night sweats, weight gain or weight loss Psychological ROS: negative for - behavioral disorder, hallucinations, memory difficulties, mood swings or suicidal ideation Ophthalmic ROS: negative for - blurry vision, double vision, eye pain or loss of vision ENT ROS: negative for - epistaxis, nasal discharge, oral lesions, sore throat, tinnitus or vertigo Allergy and Immunology ROS: negative for - hives or itchy/watery  eyes Hematological and Lymphatic ROS: negative for - bleeding problems, bruising or swollen lymph nodes Endocrine ROS: negative for - galactorrhea, hair pattern changes, polydipsia/polyuria or temperature intolerance Respiratory ROS: negative for - cough, hemoptysis, shortness of breath or wheezing Cardiovascular ROS: negative for - chest pain, dyspnea on exertion, edema or irregular heartbeat Gastrointestinal ROS: negative for - abdominal pain, diarrhea, hematemesis, nausea/vomiting or stool incontinence Genito-Urinary ROS: negative for - dysuria, hematuria, incontinence or urinary frequency/urgency Musculoskeletal ROS: negative for - joint swelling or muscular weakness Neurological ROS: as noted in HPI Dermatological ROS: negative for rash and skin lesion changes   General Exam  Blood pressure 107/74, pulse 99, temperature 99.3 F (37.4 C), resp. rate 15, height 5\' 3"  (1.6 m), weight 73 kg (161 lb), SpO2 98 %. HEENT-  Normocephalic, no lesions, without obvious abnormality.  Normal external eye and conjunctiva.  Normal TM's bilaterally.  Normal auditory canals and external ears. Normal external nose, mucus membranes and septum.  Normal pharynx. Cardiovascular- regular rate and rhythm, S1, S2 normal, no murmur, click, rub or gallop, pulses palpable throughout   Lungs- chest clear, no wheezing, rales, normal symmetric air entry, Heart exam - S1, S2 normal, no murmur, no gallop, rate regular Abdomen- soft, non-tender; bowel sounds normal; no masses,  no organomegaly Extremities- less then 2 second capillary refill Lymph-no adenopathy palpable Musculoskeletal-no joint tenderness, deformity or swelling Skin-warm and dry, no hyperpigmentation, vitiligo, or suspicious lesions  Neurological Examination Mental Status: Alert, oriented, thought content appropriate.  Speech fluent without evidence of  aphasia.  Able to follow 3 step commands without difficulty. Cranial Nerves: Cranial nerves II through XII are intact. Motor: Right : Upper extremity   5/5    Left:     Upper extremity   5/5  Lower extremity   5/5     Lower extremity   5/5 Tone and bulk:normal tone throughout; no atrophy noted Sensory: Pinprick and light touch intact throughout, bilaterally Deep Tendon Reflexes: 2+ and symmetric throughout Plantars: Right: downgoing   Left: downgoing Cerebellar: normal finger-to-nose, normal rapid alternating movements and normal heel-to-shin test    Lab Results: Basic Metabolic Panel:  Recent Labs Lab 10/26/15 1104  NA 139  K 3.2*  CL 113*  CO2 19*  GLUCOSE 108*  BUN 10  CREATININE 0.86  CALCIUM 9.1    Liver Function Tests: No results for input(s): AST, ALT, ALKPHOS, BILITOT, PROT, ALBUMIN in the last 168 hours. No results for input(s): LIPASE, AMYLASE in the last 168 hours. No results for input(s): AMMONIA in the last 168 hours.  CBC:  Recent Labs Lab 10/26/15 1104  WBC 4.9  NEUTROABS 2.4  HGB 12.5  HCT 35.5*  MCV 86.8  PLT 223    Cardiac Enzymes: No results for input(s): CKTOTAL, CKMB, CKMBINDEX, TROPONINI in the last 168 hours.  Lipid Panel: No results for input(s): CHOL, TRIG, HDL, CHOLHDL, VLDL, LDLCALC in the last 168 hours.  CBG:  Recent Labs Lab 10/26/15 1016  GLUCAP 102*    Microbiology: Results for orders placed or performed during the hospital encounter of 06/22/15  MRSA PCR Screening     Status: None   Collection Time: 06/22/15 11:04 PM  Result Value Ref Range Status   MRSA by PCR NEGATIVE NEGATIVE Final    Comment:        The GeneXpert MRSA Assay (FDA approved for NASAL specimens only), is one component of a comprehensive MRSA colonization surveillance program. It is not intended to diagnose MRSA infection nor to guide or monitor treatment for MRSA infections.     Coagulation Studies: No results for input(s): LABPROT,  INR in the last 72 hours.  Imaging: No results found.  Assessment/Plan:  [name] is a 45 year old patient with a long history of seizure-like activity. She presents today with what appeared to be generalized tonic-clonic events. It appears the last event was around noon. She has been given some Ativan. She takes Topamax as an outpatient. It appears that her antiepileptic medications are being gradually weaned off. She presently appears slightly lethargic but is able to answer questions. She states that she is unable to remember any details  of her past medical history pertaining to the seizures.  Based upon review of the chart and her previous evaluations, it appears these events are most likely nonepileptic in nature. There are a number of notes documenting pseudoseizures in her file. If [name] improves back to her baseline she should be stable for discharge to home. If these events continue perhaps psychiatry intervention would be of benefit.   Plan:  1. If these events ceased and [name] is back to her baseline she should be able to go home.  2. If these events persist psychiatric intervention with inpatient therapy may be necessary.    Safina Huard A. Tasia Catchings, M.D. Neurohospitalist Phone: [phone]  10/26/2015, 2:04 PM

## 2015-10-26 NOTE — ED Notes (Signed)
Pt had two witnessed seizures with RN in room. Pt unconscious, shaking all over, HR 180's briefly. Seizure lasted approx 30-45 seconds. RN turned pt on side. Md notified. Pt resting in bed now

## 2015-10-26 NOTE — ED Notes (Signed)
PT had another witnessed seizure x3 lasting about 30 seconds. MD Pfeiffer called to bedside. Verbal order to give 2mg  of ativan IV. Pts sats 98% on 2L [place]. HR 122 bp 106/67

## 2015-10-26 NOTE — ED Notes (Addendum)
MD Pfeiffer made aware pt had another seizure lasting about 20 seconds. Pt is alert and oriented to person and place at this time.

## 2015-10-27 ENCOUNTER — Ambulatory Visit: Payer: Commercial Managed Care - HMO | Admitting: Psychiatry

## 2015-10-27 ENCOUNTER — Encounter (HOSPITAL_COMMUNITY): Payer: Self-pay | Admitting: *Deleted

## 2015-10-27 DIAGNOSIS — E876 Hypokalemia: Secondary | ICD-10-CM

## 2015-10-27 DIAGNOSIS — B9689 Other specified bacterial agents as the cause of diseases classified elsewhere: Secondary | ICD-10-CM | POA: Diagnosis not present

## 2015-10-27 DIAGNOSIS — K219 Gastro-esophageal reflux disease without esophagitis: Secondary | ICD-10-CM

## 2015-10-27 DIAGNOSIS — F445 Conversion disorder with seizures or convulsions: Secondary | ICD-10-CM | POA: Diagnosis not present

## 2015-10-27 DIAGNOSIS — F411 Generalized anxiety disorder: Secondary | ICD-10-CM | POA: Diagnosis not present

## 2015-10-27 DIAGNOSIS — F4001 Agoraphobia with panic disorder: Secondary | ICD-10-CM

## 2015-10-27 DIAGNOSIS — G43109 Migraine with aura, not intractable, without status migrainosus: Secondary | ICD-10-CM

## 2015-10-27 DIAGNOSIS — F418 Other specified anxiety disorders: Secondary | ICD-10-CM

## 2015-10-27 DIAGNOSIS — E872 Acidosis: Secondary | ICD-10-CM

## 2015-10-27 DIAGNOSIS — J449 Chronic obstructive pulmonary disease, unspecified: Secondary | ICD-10-CM

## 2015-10-27 DIAGNOSIS — N3000 Acute cystitis without hematuria: Secondary | ICD-10-CM | POA: Diagnosis not present

## 2015-10-27 DIAGNOSIS — F329 Major depressive disorder, single episode, unspecified: Secondary | ICD-10-CM | POA: Diagnosis not present

## 2015-10-27 LAB — BASIC METABOLIC PANEL
Anion gap: 5 (ref 5–15)
BUN: 11 mg/dL (ref 6–20)
CALCIUM: 8.8 mg/dL — AB (ref 8.9–10.3)
CO2: 21 mmol/L — ABNORMAL LOW (ref 22–32)
CREATININE: 0.95 mg/dL (ref 0.44–1.00)
Chloride: 114 mmol/L — ABNORMAL HIGH (ref 101–111)
GFR calc Af Amer: >60 mL/min (ref >60)
GLUCOSE: 102 mg/dL — AB (ref 65–99)
Potassium: 3.8 mmol/L (ref 3.5–5.1)
SODIUM: 140 mmol/L (ref 135–145)

## 2015-10-27 LAB — CK: Total CK: 75 U/L (ref 38–234)

## 2015-10-27 MED ORDER — MAGNESIUM SULFATE 2 GM/50ML IV SOLN
2.0000 g | Freq: Once | INTRAVENOUS | Status: AC
  Administered 2015-10-27: 2 g via INTRAVENOUS
  Filled 2015-10-27: qty 50

## 2015-10-27 MED ORDER — FLUOXETINE HCL 20 MG PO CAPS
20.0000 mg | ORAL_CAPSULE | Freq: Every day | ORAL | Status: DC
  Administered 2015-10-27: 20 mg via ORAL
  Filled 2015-10-27: qty 1

## 2015-10-27 MED ORDER — PANTOPRAZOLE SODIUM 40 MG PO TBEC
40.0000 mg | DELAYED_RELEASE_TABLET | Freq: Every day | ORAL | Status: DC
  Administered 2015-10-27 – 2015-10-28 (×2): 40 mg via ORAL
  Filled 2015-10-27 (×2): qty 1

## 2015-10-27 MED ORDER — TOPIRAMATE 100 MG PO TABS
100.0000 mg | ORAL_TABLET | Freq: Every day | ORAL | Status: DC
  Administered 2015-10-27 – 2015-10-28 (×2): 100 mg via ORAL
  Filled 2015-10-27 (×2): qty 1

## 2015-10-27 MED ORDER — POTASSIUM CHLORIDE CRYS ER 20 MEQ PO TBCR
40.0000 meq | EXTENDED_RELEASE_TABLET | Freq: Once | ORAL | Status: AC
  Administered 2015-10-27: 40 meq via ORAL
  Filled 2015-10-27: qty 2

## 2015-10-27 MED ORDER — ASPIRIN EC 81 MG PO TBEC
81.0000 mg | DELAYED_RELEASE_TABLET | Freq: Every day | ORAL | Status: DC
  Administered 2015-10-27 – 2015-10-28 (×2): 81 mg via ORAL
  Filled 2015-10-27 (×2): qty 1

## 2015-10-27 MED ORDER — LORATADINE 10 MG PO TABS: 10.0000 mg | ORAL_TABLET | Freq: Every day | ORAL | Status: DC

## 2015-10-27 MED ORDER — TOPIRAMATE 25 MG PO TABS
50.0000 mg | ORAL_TABLET | Freq: Every day | ORAL | Status: DC
  Administered 2015-10-27: 50 mg via ORAL
  Filled 2015-10-27: qty 2

## 2015-10-27 MED ORDER — KETOROLAC TROMETHAMINE 30 MG/ML IJ SOLN
30.0000 mg | Freq: Once | INTRAMUSCULAR | Status: AC
  Administered 2015-10-27: 30 mg via INTRAVENOUS
  Filled 2015-10-27: qty 1

## 2015-10-27 MED ORDER — POTASSIUM CHLORIDE IN NACL 40-0.9 MEQ/L-% IV SOLN
INTRAVENOUS | Status: DC
  Filled 2015-10-27: qty 1000

## 2015-10-27 MED ORDER — BUSPIRONE HCL 15 MG PO TABS
7.5000 mg | ORAL_TABLET | Freq: Three times a day (TID) | ORAL | Status: DC
  Administered 2015-10-27 – 2015-10-28 (×4): 7.5 mg via ORAL
  Filled 2015-10-27 (×5): qty 1

## 2015-10-27 MED ORDER — GABAPENTIN 300 MG PO CAPS
300.0000 mg | ORAL_CAPSULE | Freq: Two times a day (BID) | ORAL | Status: DC
  Administered 2015-10-27 – 2015-10-28 (×3): 300 mg via ORAL
  Filled 2015-10-27 (×3): qty 1

## 2015-10-27 MED ORDER — POTASSIUM CHLORIDE CRYS ER 20 MEQ PO TBCR
40.0000 meq | EXTENDED_RELEASE_TABLET | Freq: Once | ORAL | Status: DC
Start: 2015-10-27 — End: 2015-10-27

## 2015-10-27 MED ORDER — TOPIRAMATE 25 MG PO TABS: 50.0000 mg | ORAL_TABLET | Freq: Two times a day (BID) | ORAL | Status: DC

## 2015-10-27 MED ORDER — ESCITALOPRAM OXALATE 10 MG PO TABS
20.0000 mg | ORAL_TABLET | Freq: Every day | ORAL | Status: DC
Start: 2015-10-27 — End: 2015-10-28
  Administered 2015-10-27 – 2015-10-28 (×2): 20 mg via ORAL
  Filled 2015-10-27 (×2): qty 2

## 2015-10-27 MED ORDER — POTASSIUM CHLORIDE IN NACL 40-0.9 MEQ/L-% IV SOLN
INTRAVENOUS | Status: AC
  Administered 2015-10-27 – 2015-10-28 (×2): 100 mL/h via INTRAVENOUS
  Filled 2015-10-27 (×3): qty 1000

## 2015-10-27 MED ORDER — ALBUTEROL SULFATE (2.5 MG/3ML) 0.083% IN NEBU: 3.0000 mL | INHALATION_SOLUTION | Freq: Four times a day (QID) | RESPIRATORY_TRACT | Status: DC | PRN

## 2015-10-27 MED ORDER — ACETAMINOPHEN 500 MG PO TABS
1000.0000 mg | ORAL_TABLET | Freq: Four times a day (QID) | ORAL | Status: DC | PRN
  Administered 2015-10-27 (×2): 1000 mg via ORAL
  Filled 2015-10-27 (×2): qty 2

## 2015-10-27 NOTE — Progress Notes (Signed)
Patient's episode began after checking her left brachial pulse. Her head started shaking from left to right then she froze with eyes open wide and stared. She was not responsive when her name was called. Two convulsions back to back last for about 20 to 30 seconds.

## 2015-10-27 NOTE — Progress Notes (Addendum)
Pt just had an episode of seizure-like activity of staring with arms and legs jerking, lasting for about 20 secs, pt responded to her name but weak and sleepy, Dr Wynetta Emery (on call) paged and notified, no new orders at this time, will however continue to monitor. Obasogie-Asidi, Johanna Matto Efe
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
## 2015-10-27 NOTE — Progress Notes (Signed)
Pt had another episode. Tele called to notify RN about pt's HR > 120 and stated HR in 150's. RN went to pt's room where pt started convulsing for 20-30 sec. After convulsions pt appeared tired and now relaxed laying in bed. Pt responding to questions. Pt stated head hurting and nausea.

## 2015-10-27 NOTE — Progress Notes (Signed)
Pt observed to have two more episodes of seizure like activity occurring back to back, pt noticed to be lethargic with eyes blinking, Dr Wynetta Emery paged and notified, said to continue to monitor, v/s stable at this time. [place]-As[place], Briza Bark Efe

## 2015-10-27 NOTE — Progress Notes (Signed)
Pt had another episode while nursing student checking vitals. Pt was assessed by Hulen Skains, MD post episode. Pt was able to tell MD name. MD stated most likely psuedo seizures but that he was concerned about how pale pt was. Pt asked to check history of Hg and inform primary MD of assessment.
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
## 2015-10-27 NOTE — Progress Notes (Signed)
Pt had a second episode, same symptoms as the first one with jerky limbs, same lasted for about 15-20 secs, Dr Wynetta Emery also paged and notified, pt reassured. [name], [name] Causey Efe
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
## 2015-10-27 NOTE — Progress Notes (Signed)
Pt had another episode while nursing student in room. Pt was administered O2 and vitals checked. Episode lasted 20-30 seconds. MD notified.

## 2015-10-27 NOTE — Progress Notes (Signed)
Paged by RN that patient was having 9 out of 10 abdominal pain. At bedside, she appeared calm and comfortable. She described that she was having "aggravating" pain localized to her central groin which started about an hour ago after she finished voiding. She described her urine is clear without trace of blood or any other abnormalities. She is had similar pain at home in the past which is relieved with her pain medication. She has also tried ibuprofen up to 1200 mg and acetaminophen without relief. Though she is prescribed ketorolac in the past, she could not afford it due to coverage.   On exam, her belly is soft and nontender with normoactive bowel sounds. No guarding was noted. Palpation of her lower abdomen with stethoscope on distraction do not elicit any grimacing as she pleasantly spoke to me about her 5 ferrets at home. No CVA tenderness noted bilaterally.  We will give her ketorolac 30 mg IV 1 for now as I am inclined to avoid sedatives given her recent seizure-like activity. We will also renew her IV fluids in the event she is having renal colic which was noted on most recent abdominal CT 09/27/15. I will also update signout to the on-call team about her condition should it change.

## 2015-10-28 DIAGNOSIS — B9689 Other specified bacterial agents as the cause of diseases classified elsewhere: Secondary | ICD-10-CM | POA: Diagnosis not present

## 2015-10-28 DIAGNOSIS — N3000 Acute cystitis without hematuria: Secondary | ICD-10-CM | POA: Diagnosis not present

## 2015-10-28 DIAGNOSIS — G43109 Migraine with aura, not intractable, without status migrainosus: Secondary | ICD-10-CM | POA: Diagnosis not present

## 2015-10-28 DIAGNOSIS — R102 Pelvic and perineal pain: Secondary | ICD-10-CM

## 2015-10-28 DIAGNOSIS — E872 Acidosis: Secondary | ICD-10-CM | POA: Diagnosis not present

## 2015-10-28 DIAGNOSIS — E876 Hypokalemia: Secondary | ICD-10-CM | POA: Diagnosis not present

## 2015-10-28 DIAGNOSIS — F445 Conversion disorder with seizures or convulsions: Secondary | ICD-10-CM | POA: Diagnosis not present

## 2015-10-28 LAB — CBC
HCT: 38.2 % (ref 36.0–46.0)
Hemoglobin: 12.2 g/dL (ref 12.0–15.0)
MCH: 28.8 pg (ref 26.0–34.0)
MCHC: 31.9 g/dL (ref 30.0–36.0)
MCV: 90.3 fL (ref 78.0–100.0)
PLATELETS: 240 10*3/uL (ref 150–400)
RBC: 4.23 MIL/uL (ref 3.87–5.11)
RDW: 11.9 % (ref 11.5–15.5)
WBC: 4.3 10*3/uL (ref 4.0–10.5)

## 2015-10-28 LAB — BASIC METABOLIC PANEL
Anion gap: 3 — ABNORMAL LOW (ref 5–15)
BUN: 10 mg/dL (ref 6–20)
CHLORIDE: 116 mmol/L — AB (ref 101–111)
CO2: 20 mmol/L — AB (ref 22–32)
CREATININE: 0.87 mg/dL (ref 0.44–1.00)
Calcium: 8.7 mg/dL — ABNORMAL LOW (ref 8.9–10.3)
GFR calc Af Amer: >60 mL/min (ref >60)
GFR calc non Af Amer: >60 mL/min (ref >60)
Glucose, Bld: 109 mg/dL — ABNORMAL HIGH (ref 65–99)
Potassium: 4.4 mmol/L (ref 3.5–5.1)
SODIUM: 139 mmol/L (ref 135–145)

## 2015-10-28 LAB — URINALYSIS, ROUTINE W REFLEX MICROSCOPIC
BILIRUBIN URINE: NEGATIVE
Glucose, UA: NEGATIVE mg/dL
Hgb urine dipstick: NEGATIVE
KETONES UR: NEGATIVE mg/dL
NITRITE: POSITIVE — AB
PH: 6.5 (ref 5.0–8.0)
PROTEIN: NEGATIVE mg/dL
Specific Gravity, Urine: 1.018 (ref 1.005–1.030)

## 2015-10-28 LAB — URINE MICROSCOPIC-ADD ON

## 2015-10-28 MED ORDER — BUSPIRONE HCL 7.5 MG PO TABS: 7.5000 mg | ORAL_TABLET | Freq: Three times a day (TID) | ORAL | 1 refills | Status: DC

## 2015-10-28 MED ORDER — KETOROLAC TROMETHAMINE 30 MG/ML IJ SOLN
30.0000 mg | Freq: Four times a day (QID) | INTRAMUSCULAR | Status: DC | PRN
Start: 2015-10-28 — End: 2015-10-28

## 2015-10-28 MED ORDER — TOPIRAMATE 50 MG PO TABS: ORAL_TABLET | ORAL | 0 refills | Status: DC

## 2015-10-28 MED ORDER — KETOROLAC TROMETHAMINE 30 MG/ML IJ SOLN
30.0000 mg | Freq: Once | INTRAMUSCULAR | Status: AC
  Administered 2015-10-28: 30 mg via INTRAVENOUS
  Filled 2015-10-28: qty 1

## 2015-10-28 MED ORDER — ESCITALOPRAM OXALATE 20 MG PO TABS: 20.0000 mg | ORAL_TABLET | Freq: Every day | ORAL | 1 refills | Status: DC

## 2015-10-28 MED ORDER — PROMETHAZINE HCL 25 MG/ML IJ SOLN: 12.5000 mg | Freq: Four times a day (QID) | INTRAMUSCULAR | Status: DC | PRN

## 2015-10-28 MED ORDER — FOSFOMYCIN TROMETHAMINE 3 G PO PACK
3.0000 g | PACK | Freq: Once | ORAL | Status: AC
  Administered 2015-10-28: 3 g via ORAL
  Filled 2015-10-28: qty 3

## 2015-10-28 NOTE — Care Management Note (Signed)
Case Management Note  Patient Details  Name: [name] MRN: [id number] Date of Birth: 03-31-1970  Subjective/Objective:   Pt in with psychogenic nonepileptic seizures. She is from home with her spouse.                  Action/Plan: Plan is for home when medically ready. CM following for d/c needs.   Expected Discharge Date:                  Expected Discharge Plan:  Home/Self Care  In-House Referral:     Discharge planning Services     Post Acute Care Choice:    Choice offered to:     DME Arranged:    DME Agency:     HH Arranged:    HH Agency:     Status of Service:  In process, will continue to follow  If discussed at Long Length of Stay Meetings, dates discussed:    Additional Comments:  Pollie Friar, RN 10/28/2015, 2:44 PM
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
## 2015-10-28 NOTE — Progress Notes (Signed)
Patient is discharged from room 5M03 at this time. Alert and in stable condition. IV site d/c'd as well as tele. Instructions read to patient with understanding verbalized. Left unit via wheelchair with husband and all belongings at side.
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
## 2015-10-28 NOTE — Discharge Summary (Signed)
Name: [name] MRN: [id number] DOB: 11/11/1970 45 y.o. PCP: Glendon Axe, MD  Date of Admission: 10/26/2015 10:16 AM Date of Discharge: 10/28/2015 Attending Physician: Annia Belt, MD  Discharge Diagnosis: Principal Problem:   Nonepileptic episode [name]) Active Problems:   COPD (chronic obstructive pulmonary disease) ([place])   Sleep apnea   Migraines   Acute cystitis without hematuria   Discharge Medications:   Medication List    STOP taking these medications   divalproex 500 MG DR tablet Commonly known as:  DEPAKOTE   EPIPEN 2-PAK 0.3 mg/0.3 mL Soaj injection Generic drug:  EPINEPHrine   FLUoxetine 20 MG capsule Commonly known as:  PROZAC   ketorolac 10 MG tablet Commonly known as:  TORADOL   lacosamide 200 MG Tabs tablet Commonly known as:  VIMPAT   mirtazapine 15 MG tablet Commonly known as:  REMERON   promethazine 25 MG tablet Commonly known as:  PHENERGAN     TAKE these medications   acetaminophen 500 MG tablet Commonly known as:  TYLENOL Take 1,000 mg by mouth every 6 (six) hours as needed for mild pain or headache.   albuterol 108 (90 Base) MCG/ACT inhaler Commonly known as:  PROVENTIL HFA;VENTOLIN HFA Inhale 2 puffs into the lungs every 6 (six) hours as needed for wheezing or shortness of breath.   aspirin EC 81 MG tablet Take 81 mg by mouth daily.   busPIRone 7.5 MG tablet Commonly known as:  BUSPAR Take 1 tablet (7.5 mg total) by mouth 3 (three) times daily.   dicyclomine 10 MG capsule Commonly known as:  BENTYL Take 10 mg by mouth. Reported on 09/09/2015   escitalopram 20 MG tablet Commonly known as:  LEXAPRO Take 1 tablet (20 mg total) by mouth daily.   FLOVENT HFA 110 MCG/ACT inhaler Generic drug:  fluticasone Inhale 1 puff into the lungs 2 (two) times daily.   gabapentin 300 MG capsule Commonly known as:  NEURONTIN Take 300 mg by mouth 2 (two) times daily.   HYDROcodone-acetaminophen 5-325 MG tablet Commonly  known as:  NORCO Take 1-2 tablets by mouth every 6 (six) hours as needed.   loratadine 10 MG tablet Commonly known as:  CLARITIN Take 10 mg by mouth daily. Reported on 09/09/2015   meloxicam 7.5 MG tablet Commonly known as:  MOBIC Take 7.5-15 mg by mouth daily as needed for pain.   multivitamin tablet Take 1 tablet by mouth daily.   pantoprazole 40 MG tablet Commonly known as:  PROTONIX Take 40 mg by mouth daily. Reported on 08/03/2015   topiramate 50 MG tablet Commonly known as:  TOPAMAX Take 1 tablet in the morning, 2 tablets at night until 8/29. Take 2 tablets in the morning and night thereafter. What changed:  medication strength  additional instructions  Another medication with the same name was removed. Continue taking this medication, and follow the directions you see here.       Disposition and follow-up:   [name] was discharged from [facility] in Stable condition.     Follow-up Appointments: Follow-up Information    Ernest Haber. Go on 11/10/2015.   Specialty:  Neurology Why:  You have an appointment with Dr. Amalia Hailey on Wednesday, September 6th. Contact information: [place] [id number] [phone]        Glendon Axe, MD. Daphane Shepherd on 11/03/2015.   Specialty:  Internal Medicine Why:  You have an appointment with Dr. Candiss Norse on Wednesday, August 30th at 9:45 am. Contact information:  [place] [address] [phone]        Rainey Pines, MD. Schedule an appointment as soon as possible for a visit in 1 week(s).   Specialty:  Psychiatry Contact information: [place] [address] [phone]           Hospital Course by problem list: Principal Problem:   Psychogenic nonepileptic seizure Active Problems:   COPD (chronic obstructive pulmonary disease) ([place])   Sleep apnea   Migraines   Suprapubic pain, acute   [name]  is a 45 yo female with non-epileptic seizures disorder, anxiety, depression, chronic neck pain, migraines, COPD, GERD hospitalized for recurrent seizure-like activity.   Seizure-like activity: Unclear etiology. Electrolytes reassuring other than non-gap metabolic acidosis [bicarbonate was slightly low at 19, anion gap 7] though to be from topiramate. CBC, urine toxicology, urinalysis were reassuring. EEG was without epileptiofr. In discussion with her outpatient neurologist, Dr. Amalia Hailey, we decided to increase topiramate from 50 mg bid to 50 mg in the morning and 100 mg in the evening x 7 days following 100 mg twice daily thereafter. Psychiatry was consulted and who stopped fluoxetine and started buspirone and escitalopram. She was discharged to home on 8/24 after confirmation by her husband that she was back to baseline neurologically.  -Please assess her adherence to the medications noted above.  Uncomplicated cystitis: On day #1 of admission, [name] developed "aggravating" pain in the pelvic region. Repeat urinalysis was notable for nitrites and moderate leukocytes. She was treated with 3 mg single dose of Fosfomycin and Toradol + Tylenol for pain. Lab studies, exam, and vitals were non-concerning for systemic infection.  -Please assess resolution of symptoms at discharge.  Discharge Vitals:   BP 101/64 (BP Location: Right Arm)   Pulse 72   Temp 98.3 F (36.8 C) (Oral)   Resp 20   Ht 5\' 3"  (1.6 m)   Wt 161 lb (73 kg)   LMP  (LMP Unknown)   SpO2 98%   BMI 28.52 kg/m   Pertinent Labs, Studies, and Procedures:    Ref Range & Units 4d ago 6d ago   Color, Urine YELLOW YELLOW YELLOW   APPearance CLEAR CLOUDY  CLEAR   Specific Gravity, Urine 1.005 - 1.030 1.018 1.025   pH 5.0 - 8.0 6.5 6.0   Glucose, UA NEGATIVE mg/dL NEGATIVE NEGATIVE   Hgb urine dipstick NEGATIVE NEGATIVE NEGATIVE   Bilirubin Urine NEGATIVE NEGATIVE NEGATIVE   Ketones, ur NEGATIVE mg/dL NEGATIVE NEGATIVE    Protein, ur NEGATIVE mg/dL NEGATIVE NEGATIVE   Nitrite NEGATIVE POSITIVE  NEGATIVE   Leukocytes, UA NEGATIVE MODERATE  NEGATIVECM  Resulting Agency  [facility]    Ref Range & Units 4d ago 47mo ago    Squamous Epithelial / LPF NONE SEEN 0-5  0-5    WBC, UA 0 - 5 WBC/hpf 6-30 0-5   RBC / HPF 0 - 5 RBC/hpf 0-5 0-5   Bacteria, UA NONE SEEN MANY  FEW    Discharge Instructions: Discharge Instructions    Call MD for:  extreme fatigue    Complete by:  As directed   Call MD for:  persistant dizziness or light-headedness    Complete by:  As directed   Call MD for:  temperature >100.4    Complete by:  As directed   Increase activity slowly    Complete by:  As directed      Signed: Riccardo Dubin, MD  10/28/2015, 4:20 PM   Pager: @MYPAGER @

## 2015-10-28 NOTE — Discharge Instructions (Signed)
You were admitted to the hospital and treated for seizures and a urinary tract infection.  For the seizures, Dr. Amalia Hailey (your neurologist) would like you to do the following. -TAKE Topiramate 50 mg in the morning and 100 mg in the evening for the next 5 days (through Aug. 29th) -THEN increase your dose to 100 mg twice daily (100 mg in the morning and 100 mg in the evening).   For your psychiatry medications, please take note of the following and follow-up with Dr. Rainey Pines.  -START Buspar 7.5mg  THREE times daily -START Lexapro 20mg  DAILY -STOP Prozac

## 2015-10-28 NOTE — Progress Notes (Signed)
Patient had rhythmic jerking movement of entire body x 2 this am  of her entire body that lasted for about 10-20 seconds. Pt  Immediately stoop when nurse touch her and tell her to stop. Pt   C/o confusing and incontinent of urine which pt stated was not aware of. Toradol 30 mg iv x 1 given for abdominal pain with some relief verbalized by pt.

## 2015-10-28 NOTE — Progress Notes (Signed)
   Subjective: This morning, she was eating breakfast but continued to have slowed speech when asked questions. She reports persistent pain in her central groin. She denies dysuria, changes to her urine, or prior history of urinary tract infections. We explained to her that we have made some changes to her medications and are hoping we   Objective:  Vital signs in last 24 hours: Vitals:   10/27/15 2209 10/28/15 0206 10/28/15 0542 10/28/15 0935  BP: (!) 100/55 107/60 109/61 100/64  Pulse: 82 80 79 86  Resp: 20 20 20 20   Temp: 98 F (36.7 C) 98.1 F (36.7 C) 98.2 F (36.8 C) 98.5 F (36.9 C)  TempSrc: Oral Oral Oral Oral  SpO2: 99% 100% 98% 96%  Weight:      Height:       Physical Exam  Constitutional: She is oriented to person, place, and time. No distress.  HENT:  Head: Normocephalic and atraumatic.  Eyes: Conjunctivae are normal. No scleral icterus.  Cardiovascular: Normal rate and regular rhythm.   Pulmonary/Chest: Effort normal. No respiratory distress.  Abdominal: Soft. Bowel sounds are normal. She exhibits no distension.  Tenderness noted with palpation just under the umbilicus though less so when distracted with auscultation.  Neurological: She is alert and oriented to person, place, and time. No cranial nerve deficit. Coordination normal.  Skin: She is not diaphoretic.  Tattoo of the left lower extremity  Psychiatric:  Slowed speech   Assessment/Plan:  Principal Problem:   Psychogenic nonepileptic seizure Active Problems:   COPD (chronic obstructive pulmonary disease) ([place])   Sleep apnea   Migraines  Ms. Amonett is a 45 year old female seizure disorder, anxiety/depression, COPD, GERD, migraines, chronic neck pain hospitalized for 1 day history of seizure-like activity found to have uncomplicated cystitis.  Uncomplicated cystitis: Urinalysis notable for bacteria, WBCs, nitrites. Hemodynamically stable. Afebrile.  -Treat fosfomycin 1g to avoid adverse effects of  fluoroquinolones and nitrofurantoin   Seizure-like activity: Non-epileptiform in nature from prior workup. CK reassuring. Her oxygen saturations have remained in the mid to high 90s on room air. -Restart regular diet -Discontinue cardiac monitoring and pulse oximetry -Continue topiramate 50mg  in the morning, 100mg  at bedtime x 7 days [Day 2/7] before increasing to 100mg  twice daily per her outpatient neurologist, Dr. Phoebe Perch recommendations  Non-gap metabolic acidosis: 2/2 topiramate. Bicarbonate stable at 20.   GERD: Continue pantoprazole 40 mg daily.  COPD: No PFTs on file. Continue albuterol nebulizers as needed for wheezing.  Migraines: Continue topiramate as noted above.  Chronic neck: Continue gabapentin 300mg  twice daily.  Anxiety/depression: Continue buspirone 7.5mg  three times daily and escitalopram 20mg  daily. -Psychiatry following, appreciate recommendations  Dispo: Anticipated discharge in approximately 1-2 day(s).   Riccardo Dubin, MD 10/28/2015, 11:11 AM Pager: @[email or web address] @

## 2015-11-03 DIAGNOSIS — M542 Cervicalgia: Secondary | ICD-10-CM | POA: Diagnosis not present

## 2015-11-10 DIAGNOSIS — M545 Low back pain: Secondary | ICD-10-CM | POA: Diagnosis not present

## 2015-11-10 DIAGNOSIS — G40804 Other epilepsy, intractable, without status epilepticus: Secondary | ICD-10-CM | POA: Diagnosis not present

## 2015-11-10 DIAGNOSIS — G43119 Migraine with aura, intractable, without status migrainosus: Secondary | ICD-10-CM | POA: Diagnosis not present

## 2015-11-24 ENCOUNTER — Ambulatory Visit: Payer: Commercial Managed Care - HMO | Attending: Neurology

## 2015-11-24 DIAGNOSIS — R0683 Snoring: Secondary | ICD-10-CM | POA: Diagnosis not present

## 2015-11-24 DIAGNOSIS — G4733 Obstructive sleep apnea (adult) (pediatric): Secondary | ICD-10-CM | POA: Insufficient documentation

## 2015-11-30 DIAGNOSIS — G4733 Obstructive sleep apnea (adult) (pediatric): Secondary | ICD-10-CM | POA: Diagnosis not present

## 2015-12-02 ENCOUNTER — Encounter: Payer: Self-pay | Admitting: Psychiatry

## 2015-12-02 ENCOUNTER — Ambulatory Visit (INDEPENDENT_AMBULATORY_CARE_PROVIDER_SITE_OTHER): Payer: Commercial Managed Care - HMO | Admitting: Psychiatry

## 2015-12-02 VITALS — BP 113/76 | HR 98 | Temp 98.4°F | Wt 168.9 lb

## 2015-12-02 DIAGNOSIS — F39 Unspecified mood [affective] disorder: Secondary | ICD-10-CM

## 2015-12-02 MED ORDER — BUSPIRONE HCL 7.5 MG PO TABS: 7.5000 mg | ORAL_TABLET | Freq: Three times a day (TID) | ORAL | 1 refills | Status: DC

## 2015-12-02 MED ORDER — ESCITALOPRAM OXALATE 20 MG PO TABS: 20.0000 mg | ORAL_TABLET | Freq: Every day | ORAL | 1 refills | Status: DC
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
## 2015-12-08 ENCOUNTER — Encounter: Payer: Self-pay | Admitting: Licensed Clinical Social Worker

## 2015-12-08 ENCOUNTER — Ambulatory Visit (INDEPENDENT_AMBULATORY_CARE_PROVIDER_SITE_OTHER): Payer: Commercial Managed Care - HMO | Admitting: Licensed Clinical Social Worker

## 2015-12-08 DIAGNOSIS — F39 Unspecified mood [affective] disorder: Secondary | ICD-10-CM | POA: Diagnosis not present
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
## 2015-12-22 ENCOUNTER — Ambulatory Visit (INDEPENDENT_AMBULATORY_CARE_PROVIDER_SITE_OTHER): Payer: Commercial Managed Care - HMO | Admitting: Licensed Clinical Social Worker

## 2015-12-22 DIAGNOSIS — F4320 Adjustment disorder, unspecified: Secondary | ICD-10-CM

## 2015-12-22 DIAGNOSIS — F330 Major depressive disorder, recurrent, mild: Secondary | ICD-10-CM

## 2015-12-22 DIAGNOSIS — F4310 Post-traumatic stress disorder, unspecified: Secondary | ICD-10-CM | POA: Diagnosis not present

## 2015-12-22 NOTE — Progress Notes (Signed)
   THERAPIST PROGRESS NOTE  Session Time: 3 PM to 3:50 PM  Participation Level: Active  Behavioral Response: CasualAlertAnxious  Type of Therapy: Individual Therapy  Treatment Goals addressed:  decrease in anxiety, depression and PTSD symptoms  Interventions: Solution Focused, Strength-based, Supportive and Other: Trauma based interventions, building therapeutic rapport  Summary: [name] is a 45 y.o. female who presents with Charmian Muff of son's birthday. He says that her memory is not good and at first shesomething is wrong, that something happened and then then it comes to her happened. The last couple of days blue. Therapist noticed her legs shaking and patient says they're always shaking and the only way they stopped this when she gets up to do something. She relates she is constantly doing things in the house that can bother her husband. Today she is feeling okay. She said she is think she is resolved issues with her son and her strong spirituality has helped with that. She identified the positive side as soon she'll be getting a service dog and already excited about that. The dog will help her not get a concussion from her seizures and help her be able to get help. Related that her seizures do not happen as frequently about 1-2 times a week. 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 As frequent nightmares and would like treatment to help for those to stop. She discussed some situations that were traumatic for her as a Advertising account executive noted that she was becoming agitated so explained and guided patient to a grounding activity.    Suicidal/Homicidal: No  Therapist Response: Therapist reviewed symptoms and identified possible trigger as anniversary of son's birthday who died from [place]. Identified the patient has to work through trauma symptoms but has resolved she believes issues related  to her son. Identified spirituality as an important coping skill her. Explained that therapist believes that patient's strategy of coping such as suppressing emotions can be the source of physical symptoms such as seizures so that coming to therapy and talking about issues will be helpful. Identified one source of trauma that patient recognizes she has suppressed has been related to being a Airline pilot. Gave patient an overview of treatment approach to addressing trauma symptoms. Completed treatment plan and patient is to work on decreasing anxiety depression and trauma symptoms. Identified positive coping related to getting a service dog. He bites supportive and strength-based interventions and began process of helping patient to process through trauma.  Plan: Return again in 2 weeks.2. Review handout on treatment overview: About STAIR/NST  Diagnosis: Axis I:  major depressive disorder recurrent, mild, anxiety disorder unspecified, PTSD    Axis II: No diagnosis    Tinsley Lomas A, LCSW 12/22/2015

## 2015-12-30 ENCOUNTER — Ambulatory Visit (INDEPENDENT_AMBULATORY_CARE_PROVIDER_SITE_OTHER): Payer: Commercial Managed Care - HMO | Admitting: Psychiatry

## 2015-12-30 ENCOUNTER — Encounter: Payer: Self-pay | Admitting: Psychiatry

## 2015-12-30 VITALS — BP 118/88 | Temp 97.6°F | Wt 170.8 lb

## 2015-12-30 DIAGNOSIS — F39 Unspecified mood [affective] disorder: Secondary | ICD-10-CM | POA: Diagnosis not present

## 2015-12-30 MED ORDER — BUSPIRONE HCL 10 MG PO TABS: 10.0000 mg | ORAL_TABLET | Freq: Three times a day (TID) | ORAL | 1 refills | Status: DC

## 2015-12-30 MED ORDER — ESCITALOPRAM OXALATE 20 MG PO TABS: 20.0000 mg | ORAL_TABLET | Freq: Every day | ORAL | 1 refills | Status: DC
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
## 2016-01-13 ENCOUNTER — Ambulatory Visit (INDEPENDENT_AMBULATORY_CARE_PROVIDER_SITE_OTHER): Payer: Commercial Managed Care - HMO | Admitting: Licensed Clinical Social Worker

## 2016-01-13 ENCOUNTER — Encounter: Payer: Self-pay | Admitting: Psychiatry

## 2016-01-13 ENCOUNTER — Ambulatory Visit (INDEPENDENT_AMBULATORY_CARE_PROVIDER_SITE_OTHER): Payer: Commercial Managed Care - HMO | Admitting: Psychiatry

## 2016-01-13 VITALS — BP 100/62 | HR 97 | Ht 63.0 in | Wt 169.0 lb

## 2016-01-13 DIAGNOSIS — F39 Unspecified mood [affective] disorder: Secondary | ICD-10-CM

## 2016-01-13 DIAGNOSIS — F4310 Post-traumatic stress disorder, unspecified: Secondary | ICD-10-CM
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
## 2016-01-18 ENCOUNTER — Other Ambulatory Visit: Payer: Self-pay | Admitting: Internal Medicine

## 2016-01-18 DIAGNOSIS — Z1231 Encounter for screening mammogram for malignant neoplasm of breast: Secondary | ICD-10-CM | POA: Diagnosis not present

## 2016-01-18 DIAGNOSIS — K219 Gastro-esophageal reflux disease without esophagitis: Secondary | ICD-10-CM | POA: Diagnosis not present

## 2016-01-18 DIAGNOSIS — J4540 Moderate persistent asthma, uncomplicated: Secondary | ICD-10-CM | POA: Diagnosis not present

## 2016-01-18 DIAGNOSIS — M150 Primary generalized (osteo)arthritis: Secondary | ICD-10-CM | POA: Diagnosis not present

## 2016-01-18 DIAGNOSIS — Z1239 Encounter for other screening for malignant neoplasm of breast: Secondary | ICD-10-CM

## 2016-01-18 DIAGNOSIS — Z131 Encounter for screening for diabetes mellitus: Secondary | ICD-10-CM | POA: Diagnosis not present

## 2016-01-18 DIAGNOSIS — J3089 Other allergic rhinitis: Secondary | ICD-10-CM | POA: Diagnosis not present

## 2016-02-01 ENCOUNTER — Ambulatory Visit (INDEPENDENT_AMBULATORY_CARE_PROVIDER_SITE_OTHER): Payer: Commercial Managed Care - HMO | Admitting: Psychiatry

## 2016-02-01 VITALS — BP 144/83 | HR 103 | Temp 97.6°F | Resp 20 | Wt 167.0 lb

## 2016-02-01 DIAGNOSIS — F39 Unspecified mood [affective] disorder: Secondary | ICD-10-CM

## 2016-02-01 DIAGNOSIS — F4310 Post-traumatic stress disorder, unspecified: Secondary | ICD-10-CM | POA: Diagnosis not present

## 2016-02-01 MED ORDER — ESCITALOPRAM OXALATE 20 MG PO TABS: 20.0000 mg | ORAL_TABLET | Freq: Every day | ORAL | 1 refills | Status: DC

## 2016-02-01 MED ORDER — BUSPIRONE HCL 10 MG PO TABS: 10.0000 mg | ORAL_TABLET | Freq: Three times a day (TID) | ORAL | 1 refills | Status: DC

## 2016-02-01 MED ORDER — GABAPENTIN 300 MG PO CAPS: 300.0000 mg | ORAL_CAPSULE | Freq: Two times a day (BID) | ORAL | 1 refills | Status: DC

## 2016-02-01 NOTE — Progress Notes (Signed)
Psychiatric MD Progress Note   Patient Identification: [name] MRN:  [id number] Date of Evaluation:  02/01/2016 Referral Source: [facility]  Chief Complaint:   Chief Complaint    Follow-up     Visit Diagnosis:    ICD-9-CM ICD-10-CM   1. Episodic mood disorder ([place]) 296.90 F39   2. PTSD (post-traumatic stress disorder) 309.81 F43.10     History of Present Illness:    Patient is a 45 year old white female with history of pseudoseizures pesented for follow-up. She was walking with a limp and stated that she is feeling anxious.She reported that she heard a loud noise in her neighborhood last night. She could not figure out what was it coming from. She reported that she is going to check on her neighbor today. She was excited about the puppy and was showing me the picture. She reported that the puppy is now 11 weeks old. Patient reported that she has been compliant with her medications and they're helping her. She does not feel tired. She has been taking the medications on a regular basis. Patient reported that she is planning to spend the holidays with her family member. She is now going to visit her mother or her in-laws as they live in [place]. She appeared calm and alert during the interview. No new symptoms noted at this time. She remained focus on the puppy.  She is also doing therapy on a regular basis. She remains alert during the interview. She denied having any seizures at this time. She denied having any suicidal homicidal ideations or plans.    Her husband is supportive. She denied having any perceptual disturbances    Associated Signs/Symptoms: Depression Symptoms:  fatigue, difficulty concentrating, impaired memory, anxiety, loss of energy/fatigue, disturbed sleep, (Hypo) Manic Symptoms:  Labiality of Mood, Anxiety Symptoms:  Excessive Worry, Psychotic Symptoms:  none PTSD Symptoms: Had a traumatic exposure:  Child passed away due to SIDS in 1988/07/02,  [place] accident 02-Jul-2009  Past Psychiatric History:  Patient has depression and anxiety but never received inpatient psychiatric hospitalization or outpatient psychiatric medication management. Patient has received Prozac from primary care physician over several years reportedly maintenance of depression anxiety.  Previous Psychotropic Medications:  Prozac Paxil Zoloft Xanax Valium   Substance Abuse History in the last 12 months:  No.  Consequences of Substance Abuse: Negative NA  Past Medical History:  Past Medical History:  Diagnosis Date  . Anxiety   . Arthritis   . Asthma   . Bilateral shoulder pain   . Chronic neck pain   . Depression   . Emphysema/COPD ([place])   . GERD (gastroesophageal reflux disease)   . Migraines   . Nasal septal perforation   . Seizure ([place])   . Sleep apnea   . Stroke [facility])     Past Surgical History:  Procedure Laterality Date  . ABDOMINAL HYSTERECTOMY     partial  . CESAREAN SECTION  1993  . PARTIAL HYSTERECTOMY  1995  . TONSILLECTOMY      Family Psychiatric History: Patient denied family history of mental illness  Family History:  Family History  Problem Relation Age of Onset  . Depression Maternal Grandmother   . Hypertension Maternal Grandmother   . Cancer Maternal Grandfather   . Lung cancer Mother   . Depression Mother   . Learning disabilities Sister   . Depression Sister   . Depression Paternal Uncle   . Hypertension Paternal Uncle   . Diabetes Neg Hx  Social History:   Social History   Social History  . Marital status: Married    Spouse name: N/A  . Number of children: N/A  . Years of education: N/A   Social History Main Topics  . Smoking status: Current Every Day Smoker    Types: E-cigarettes  . Smokeless tobacco: Never Used  . Alcohol use No  . Drug use: No  . Sexual activity: Yes    Partners: Male    Birth control/ protection: Surgical   Other Topics Concern  . Not on file   Social History  Narrative   Patient is married and lives at home with her husband.              Additional Social History:  Married x 3.  This is her 3rd marriage, married x 4 years. Has 2 children.  Husband is a Curator   Allergies:   Allergies  Allergen Reactions  . Bee Venom Anaphylaxis  . Shellfish Allergy Anaphylaxis  . Contrast Media [Iodinated Diagnostic Agents] Other (See Comments)    Unknown  Reaction:  Unknown   . Other Other (See Comments)    Unknown   . Penicillins Other (See Comments)    Reaction:  Seizures  Has patient had a PCN reaction causing immediate rash, facial/tongue/throat swelling, SOB or lightheadedness with hypotension: No Has patient had a PCN reaction causing severe rash involving mucus membranes or skin necrosis: No Has patient had a PCN reaction that required hospitalization Yes Has patient had a PCN reaction occurring within the last 10 years: Yes If all of the above answers are "NO", then may proceed with Cephalosporin use.  . Sulfa Antibiotics Itching    Metabolic Disorder Labs: No results found for: HGBA1C, MPG Lab Results  Component Value Date   PROLACTIN 21.6 06/22/2015   Lab Results  Component Value Date   TRIG 194 (H) 05/15/2014     Current Medications: Current Outpatient Prescriptions  Medication Sig Dispense Refill  . acetaminophen (TYLENOL) 500 MG tablet Take 1,000 mg by mouth every 6 (six) hours as needed for mild pain or headache.    . albuterol (PROVENTIL HFA;VENTOLIN HFA) 108 (90 BASE) MCG/ACT inhaler Inhale 2 puffs into the lungs every 6 (six) hours as needed for wheezing or shortness of breath.     Marland Kitchen aspirin EC 81 MG tablet Take 81 mg by mouth daily.    . busPIRone (BUSPAR) 10 MG tablet Take 1 tablet (10 mg total) by mouth 3 (three) times daily. 90 tablet 1  . dicyclomine (BENTYL) 10 MG capsule Take 10 mg by mouth. Reported on 09/09/2015    . escitalopram (LEXAPRO) 20 MG tablet Take 1 tablet (20 mg total) by mouth daily. 30 tablet 1   . FLOVENT HFA 110 MCG/ACT inhaler Inhale 1 puff into the lungs 2 (two) times daily.    Marland Kitchen gabapentin (NEURONTIN) 300 MG capsule Take 1 capsule (300 mg total) by mouth 2 (two) times daily. 60 capsule 1  . HYDROcodone-acetaminophen (NORCO) 5-325 MG tablet Take 1-2 tablets by mouth every 6 (six) hours as needed. 12 tablet 0  . loratadine (CLARITIN) 10 MG tablet Take 10 mg by mouth daily. Reported on 09/09/2015    . meloxicam (MOBIC) 7.5 MG tablet Take 7.5-15 mg by mouth daily as needed for pain.     . Multiple Vitamin (MULTIVITAMIN) tablet Take 1 tablet by mouth daily.    . pantoprazole (PROTONIX) 40 MG tablet Take 40 mg by mouth daily. Reported on 08/03/2015    .  tizanidine (ZANAFLEX) 2 MG capsule Take by mouth.    . topiramate (TOPAMAX) 50 MG tablet Take 1 tablet in the morning, 2 tablets at night until 8/29. Take 2 tablets in the morning and night thereafter. 30 tablet 0   No current facility-administered medications for this visit.     Neurologic: Headache: No Seizure: Yes Paresthesias:No  Musculoskeletal: Strength & Muscle Tone: within normal limits Gait & Station: off balance constantly, uses cane Patient leans: N/A  Psychiatric Specialty Exam: ROS  Blood pressure (!) 144/83, pulse (!) 103, temperature 97.6 F (36.4 C), temperature source Tympanic, resp. rate 20, weight 167 lb (75.8 kg), SpO2 98 %.Body mass index is 29.58 kg/m.  General Appearance: Casual  Eye Contact:  Fair  Speech:  Clear and Coherent  Volume:  Normal  Mood:  Anxious  Affect:  Appropriate  Thought Process:  Goal Directed  Orientation:  Full (Time, Place, and Person)  Thought Content:  WDL  Suicidal Thoughts:  No  Homicidal Thoughts:  No  Memory:  Immediate;   Fair Recent;   Fair Remote;   Fair  Judgement:  Intact  Insight:  Fair  Psychomotor Activity:  Increased  Concentration:  Concentration: Fair and Attention Span: Fair  Recall:  [employer] of Knowledge:Fair  Language: Fair  Akathisia:  No   Handed:  Right  AIMS (if indicated):    Assets:  Communication Skills Housing Physical Health Social Support  ADL's:  Intact  Cognition: WNL  Sleep:  5     Treatment Plan Summary: Medication management   Discussed with her about her medications treatment risks benefits and alternatives. She will continue on Lexapro 20 mg daily Continue BuSpar 10 mg TID  Continue  therapy on a weekly basis  She will get hold of the dog in December and will start training. She appeared very excited about the same  Follow-up in 2  Months  or earlier depending on her symptoms    More than 50% of the time spent in psychoeducation, counseling and coordination of care.    This note was generated in part or whole with voice recognition software. Voice regonition is usually quite accurate but there are transcription errors that can and very often do occur. I apologize for any typographical errors that were not detected and corrected.    Rainey Pines, MD 11/28/20172:48 PM

## 2016-02-03 ENCOUNTER — Ambulatory Visit (INDEPENDENT_AMBULATORY_CARE_PROVIDER_SITE_OTHER): Payer: Commercial Managed Care - HMO | Admitting: Licensed Clinical Social Worker

## 2016-02-03 DIAGNOSIS — F4310 Post-traumatic stress disorder, unspecified: Secondary | ICD-10-CM

## 2016-02-03 DIAGNOSIS — F39 Unspecified mood [affective] disorder: Secondary | ICD-10-CM

## 2016-02-03 NOTE — Progress Notes (Signed)
   THERAPIST PROGRESS NOTE  Session Time: 3 PM to 3:50 PM  Participation Level: Active  Behavioral Response: CasualAlertEuthymic  Type of Therapy: Individual Therapy  Treatment Goals addressed:  decrease in anxiety, depression and PTSD symptoms  Interventions: Solution Focused, Strength-based, Supportive and Other: Grief focused interventions, relaxation exercises or anxiety, introduction to trauma as focused therapy  Summary: [name] is a 45 y.o. female who presents with relating feeling "crazy"over the past couple days. She described an explosion outside of her window and not sure the source. She has been freaking out and using exercises to help her relax constantly. Discussed how relaxation exercises would be helpful and introduced meditations through an application that patient is willing to try. Related that anniversary of son's death who died from SIDS is coming up on December 16. She relates that it is solemn day, it hurts and lays in bed. She has a grief web page that helps support her of other parents who have lost children. She has ways to connect, for example, she has a tattoo to remind her of her child. Discussed working on trauma and patient related that she has feelings of nausea which indicate she has issues to work through. She describes that she has lot of things to address including her daddy who was not her biological father and her mom won't tell her who her father is. Described physical issues of hurting all the time and body feeling like a rock all the time. Discussed how relaxation but also physical intervention such as massage to help as well as specific body interventions for trauma that could be helpful for patient.  Suicidal/Homicidal: No  Therapist Response: Therapist reviewed progress in symptoms. Discussed how due to recent symptoms patient would benefit from learning more relaxation strategies. Introduced patient to "Headspace" that will guide her through a  brief daily meditation for beginners. Explained how patient practicing regular relaxation exercises will help decrease anxiety, stress and give her practice so she continues the skills when she is anxious. Identified trauma as an issue to work on with patient and described process where she first learn skills affective  and interpersonal regulation as foundation to begin trauma work. Help patient process through emotions related to grief, identified effective coping strategies she has been using and discussed helpfulness of chaneling emotional energy into meaningful outlets. Provided supportive and strength-based interventions.   Plan: Return again in 4 weeks.2. Patient practice regular meditation with application.3. Patient continue to learn and apply effective coping strategies  Diagnosis: Axis I: Episodic Mood Disorder, PTSD    Axis II: No diagnosis    Bowman,Mary A, LCSW 02/03/2016

## 2016-02-24 ENCOUNTER — Ambulatory Visit: Payer: Commercial Managed Care - HMO | Admitting: Licensed Clinical Social Worker

## 2016-02-29 ENCOUNTER — Ambulatory Visit: Payer: Commercial Managed Care - HMO

## 2016-03-09 ENCOUNTER — Ambulatory Visit: Payer: Self-pay | Admitting: Licensed Clinical Social Worker

## 2016-03-12 ENCOUNTER — Other Ambulatory Visit: Payer: Self-pay | Admitting: Psychiatry

## 2016-03-24 ENCOUNTER — Ambulatory Visit: Payer: Commercial Managed Care - HMO

## 2016-03-29 ENCOUNTER — Ambulatory Visit: Payer: Self-pay | Admitting: Psychiatry

## 2016-04-12 ENCOUNTER — Encounter: Payer: Self-pay | Admitting: Psychiatry

## 2016-04-12 ENCOUNTER — Encounter: Payer: Self-pay | Admitting: Emergency Medicine

## 2016-04-12 ENCOUNTER — Emergency Department: Payer: Medicare HMO

## 2016-04-12 ENCOUNTER — Emergency Department
Admission: EM | Admit: 2016-04-12 | Discharge: 2016-04-12 | Disposition: A | Discharge status: Home / Self Care | Payer: Medicare HMO | Attending: Emergency Medicine | Admitting: Emergency Medicine

## 2016-04-12 ENCOUNTER — Ambulatory Visit (INDEPENDENT_AMBULATORY_CARE_PROVIDER_SITE_OTHER): Payer: Medicare HMO | Admitting: Licensed Clinical Social Worker

## 2016-04-12 ENCOUNTER — Ambulatory Visit (INDEPENDENT_AMBULATORY_CARE_PROVIDER_SITE_OTHER): Payer: Medicare HMO | Admitting: Psychiatry

## 2016-04-12 VITALS — BP 104/71 | HR 83 | Temp 97.8°F | Wt 170.8 lb

## 2016-04-12 DIAGNOSIS — M542 Cervicalgia: Secondary | ICD-10-CM | POA: Insufficient documentation

## 2016-04-12 DIAGNOSIS — S0003XA Contusion of scalp, initial encounter: Secondary | ICD-10-CM | POA: Diagnosis not present

## 2016-04-12 DIAGNOSIS — F17210 Nicotine dependence, cigarettes, uncomplicated: Secondary | ICD-10-CM | POA: Diagnosis not present

## 2016-04-12 DIAGNOSIS — Z9114 Patient's other noncompliance with medication regimen: Secondary | ICD-10-CM | POA: Insufficient documentation

## 2016-04-12 DIAGNOSIS — M25572 Pain in left ankle and joints of left foot: Secondary | ICD-10-CM | POA: Diagnosis not present

## 2016-04-12 DIAGNOSIS — W19XXXA Unspecified fall, initial encounter: Secondary | ICD-10-CM | POA: Insufficient documentation

## 2016-04-12 DIAGNOSIS — F4310 Post-traumatic stress disorder, unspecified: Secondary | ICD-10-CM

## 2016-04-12 DIAGNOSIS — S99912A Unspecified injury of left ankle, initial encounter: Secondary | ICD-10-CM | POA: Diagnosis not present

## 2016-04-12 DIAGNOSIS — Y999 Unspecified external cause status: Secondary | ICD-10-CM | POA: Insufficient documentation

## 2016-04-12 DIAGNOSIS — Y929 Unspecified place or not applicable: Secondary | ICD-10-CM | POA: Diagnosis not present

## 2016-04-12 DIAGNOSIS — Z79899 Other long term (current) drug therapy: Secondary | ICD-10-CM | POA: Insufficient documentation

## 2016-04-12 DIAGNOSIS — Z7982 Long term (current) use of aspirin: Secondary | ICD-10-CM | POA: Diagnosis not present

## 2016-04-12 DIAGNOSIS — F39 Unspecified mood [affective] disorder: Secondary | ICD-10-CM

## 2016-04-12 DIAGNOSIS — M25511 Pain in right shoulder: Secondary | ICD-10-CM

## 2016-04-12 DIAGNOSIS — G40909 Epilepsy, unspecified, not intractable, without status epilepticus: Secondary | ICD-10-CM | POA: Insufficient documentation

## 2016-04-12 DIAGNOSIS — R569 Unspecified convulsions: Secondary | ICD-10-CM | POA: Diagnosis not present

## 2016-04-12 DIAGNOSIS — Y9389 Activity, other specified: Secondary | ICD-10-CM | POA: Diagnosis not present

## 2016-04-12 DIAGNOSIS — J449 Chronic obstructive pulmonary disease, unspecified: Secondary | ICD-10-CM | POA: Insufficient documentation

## 2016-04-12 DIAGNOSIS — S0990XA Unspecified injury of head, initial encounter: Secondary | ICD-10-CM | POA: Diagnosis present

## 2016-04-12 DIAGNOSIS — J45909 Unspecified asthma, uncomplicated: Secondary | ICD-10-CM | POA: Diagnosis not present

## 2016-04-12 LAB — CBC
HEMATOCRIT: 37.8 % (ref 35.0–47.0)
Hemoglobin: 13.3 g/dL (ref 12.0–16.0)
MCH: 29.9 pg (ref 26.0–34.0)
MCHC: 35.1 g/dL (ref 32.0–36.0)
MCV: 85.1 fL (ref 80.0–100.0)
PLATELETS: 271 10*3/uL (ref 150–440)
RBC: 4.44 MIL/uL (ref 3.80–5.20)
RDW: 13.1 % (ref 11.5–14.5)
WBC: 6.5 10*3/uL (ref 3.6–11.0)

## 2016-04-12 LAB — BASIC METABOLIC PANEL
ANION GAP: 6 (ref 5–15)
BUN: 14 mg/dL (ref 6–20)
CHLORIDE: 114 mmol/L — AB (ref 101–111)
CO2: 20 mmol/L — AB (ref 22–32)
Calcium: 9.1 mg/dL (ref 8.9–10.3)
Creatinine, Ser: 0.83 mg/dL (ref 0.44–1.00)
GFR calc Af Amer: >60 mL/min (ref >60)
GLUCOSE: 111 mg/dL — AB (ref 65–99)
POTASSIUM: 3.7 mmol/L (ref 3.5–5.1)
Sodium: 140 mmol/L (ref 135–145)

## 2016-04-12 MED ORDER — ACETAMINOPHEN 325 MG PO TABS
650.0000 mg | ORAL_TABLET | Freq: Once | ORAL | Status: AC
  Administered 2016-04-12: 650 mg via ORAL
  Filled 2016-04-12: qty 2

## 2016-04-12 MED ORDER — ESCITALOPRAM OXALATE 20 MG PO TABS: 20.0000 mg | ORAL_TABLET | Freq: Every day | ORAL | 1 refills | Status: DC

## 2016-04-12 MED ORDER — BUSPIRONE HCL 10 MG PO TABS: 10.0000 mg | ORAL_TABLET | Freq: Three times a day (TID) | ORAL | 1 refills | Status: DC

## 2016-04-12 MED ORDER — GABAPENTIN 300 MG PO CAPS
300.0000 mg | ORAL_CAPSULE | Freq: Two times a day (BID) | ORAL | 1 refills | Status: DC
Start: 2016-04-12 — End: 2016-08-07

## 2016-04-12 MED ORDER — PRAZOSIN HCL 1 MG PO CAPS: 1.0000 mg | ORAL_CAPSULE | Freq: Every day | ORAL | 1 refills | Status: DC

## 2016-04-12 NOTE — Discharge Instructions (Signed)
You may apply ice to your scalp, right shoulder, and left ankle to decrease pain for 10 minutes every 2-3 hours. You may also take Tylenol or your regular meloxicam.  I've spoken with Dr. Amalia Hailey, who recommends that you continue not to take any antiseizure medications.  Return to the emergency department if you develop severe pain, seizure, changes in mental status, numbness tingling or weakness, fever, or any other symptoms concerning to you.

## 2016-04-12 NOTE — ED Triage Notes (Signed)
Pt over at [facility] for check and had seizure. Pt with hx of same and was taken off seizure meds by PCP. Pt states she also had one last week. NAD, VSS>

## 2016-04-12 NOTE — Progress Notes (Signed)
Psychiatric MD Progress Note   Patient Identification: [name] MRN:  [id number] Date of Evaluation:  04/12/2016 Referral Source: [place]  Chief Complaint:   Chief Complaint    Follow-up; Medication Refill     Visit Diagnosis:    ICD-9-CM ICD-10-CM   1. Episodic mood disorder ([place]) 296.90 F39   2. PTSD (post-traumatic stress disorder) 309.81 F43.10     History of Present Illness:    Patient is a 46 year old white female with history of pseudoseizures pesented for follow-up. She was walking with a limp and stated that she is feeling anxious.SheShe reported that she has been feeling depressed and has noticed worsening of her posttraumatic stress disorder and nightmares. She reported that she has been having dreams of being raped repeatedly for the past weeks. She does not have any acute distress at this time. She reported that she was able to acquire her puppy dog 2 months ago and is getting trained. She has been doing fundraisers on line to raise money for his training. She was able to collect $300 so far. She reported that she needed to $3000 a full training. Patient reported that she is hopeful that she will be able to collect this money in the next few weeks. Patient reported that her husband is also enjoying the company of the dog. She was discussing in detail about the same. Patient reported that she feels that she has been becoming more depressed lately and does not know the reason about the same. She feels it might be related to the weather. She is sleeping well at night. We discussed about starting her on prazosin for her nightmares and she agreed with the plan.  She denied having any suicidal homicidal ideations or plans. She denied having any perceptual disturbances.  Patient reported that she continues to have seizures occasionally and she is training her dog so he can respond to the seizure and can get help for her when her husband is not around. She has never been to  the hospital related to her seizure. They have been diagnosed as pseudoseizure and she is taking medication for the same.   Associated Signs/Symptoms: Depression Symptoms:  fatigue, difficulty concentrating, impaired memory, anxiety, loss of energy/fatigue, disturbed sleep, (Hypo) Manic Symptoms:  Labiality of Mood, Anxiety Symptoms:  Excessive Worry, Psychotic Symptoms:  none PTSD Symptoms: Had a traumatic exposure:  Child passed away due to SIDS in 06-18[birth year]88, [place] accident 2009/06/18  Past Psychiatric History:  Patient has depression and anxiety but never received inpatient psychiatric hospitalization or outpatient psychiatric medication management. Patient has received Prozac from primary care physician over several years reportedly maintenance of depression anxiety.  Previous Psychotropic Medications:  Prozac Paxil Zoloft Xanax Valium   Substance Abuse History in the last 12 months:  No.  Consequences of Substance Abuse: Negative NA  Past Medical History:  Past Medical History:  Diagnosis Date  . Anxiety   . Arthritis   . Asthma   . Bilateral shoulder pain   . Chronic neck pain   . Depression   . Emphysema/COPD ([place])   . GERD (gastroesophageal reflux disease)   . Migraines   . Nasal septal perforation   . Seizure ([place])   . Sleep apnea   . Stroke [facility])     Past Surgical History:  Procedure Laterality Date  . ABDOMINAL HYSTERECTOMY     partial  . CESAREAN SECTION  1993  . PARTIAL HYSTERECTOMY  1995  . TONSILLECTOMY      Family  Psychiatric History: Patient denied family history of mental illness  Family History:  Family History  Problem Relation Age of Onset  . Depression Maternal Grandmother   . Hypertension Maternal Grandmother   . Cancer Maternal Grandfather   . Lung cancer Mother   . Depression Mother   . Learning disabilities Sister   . Depression Sister   . Depression Paternal Uncle   . Hypertension Paternal Uncle   . Diabetes Neg Hx      Social History:   Social History   Social History  . Marital status: Married    Spouse name: N/A  . Number of children: N/A  . Years of education: N/A   Social History Main Topics  . Smoking status: Current Every Day Smoker    Types: E-cigarettes  . Smokeless tobacco: Never Used  . Alcohol use No  . Drug use: No  . Sexual activity: Yes    Partners: Male    Birth control/ protection: Surgical   Other Topics Concern  . None   Social History Narrative   Patient is married and lives at home with her husband.              Additional Social History:  Married x 3.  This is her 3rd marriage, married x 4 years. Has 2 children.  Husband is a Curator   Allergies:   Allergies  Allergen Reactions  . Bee Venom Anaphylaxis  . Shellfish Allergy Anaphylaxis  . Contrast Media [Iodinated Diagnostic Agents] Other (See Comments)    Unknown  Reaction:  Unknown   . Other Other (See Comments)    Unknown   . Penicillins Other (See Comments)    Reaction:  Seizures  Has patient had a PCN reaction causing immediate rash, facial/tongue/throat swelling, SOB or lightheadedness with hypotension: No Has patient had a PCN reaction causing severe rash involving mucus membranes or skin necrosis: No Has patient had a PCN reaction that required hospitalization Yes Has patient had a PCN reaction occurring within the last 10 years: Yes If all of the above answers are "NO", then may proceed with Cephalosporin use.  . Sulfa Antibiotics Itching    Metabolic Disorder Labs: No results found for: HGBA1C, MPG Lab Results  Component Value Date   PROLACTIN 21.6 06/22/2015   Lab Results  Component Value Date   TRIG 194 (H) 05/15/2014     Current Medications: Current Outpatient Prescriptions  Medication Sig Dispense Refill  . acetaminophen (TYLENOL) 500 MG tablet Take 1,000 mg by mouth every 6 (six) hours as needed for mild pain or headache.    . albuterol (PROVENTIL HFA;VENTOLIN HFA) 108  (90 BASE) MCG/ACT inhaler Inhale 2 puffs into the lungs every 6 (six) hours as needed for wheezing or shortness of breath.     Marland Kitchen aspirin EC 81 MG tablet Take 81 mg by mouth daily.    . busPIRone (BUSPAR) 10 MG tablet Take 1 tablet (10 mg total) by mouth 3 (three) times daily. 90 tablet 1  . dicyclomine (BENTYL) 10 MG capsule Take 10 mg by mouth. Reported on 09/09/2015    . escitalopram (LEXAPRO) 20 MG tablet Take 1 tablet (20 mg total) by mouth daily. 30 tablet 1  . FLOVENT HFA 110 MCG/ACT inhaler Inhale 1 puff into the lungs 2 (two) times daily.    Marland Kitchen gabapentin (NEURONTIN) 300 MG capsule Take 1 capsule (300 mg total) by mouth 2 (two) times daily. 60 capsule 1  . loratadine (CLARITIN) 10 MG tablet Take 10  mg by mouth daily. Reported on 09/09/2015    . meloxicam (MOBIC) 7.5 MG tablet Take 7.5-15 mg by mouth daily as needed for pain.     . Multiple Vitamin (MULTIVITAMIN) tablet Take 1 tablet by mouth daily.    . pantoprazole (PROTONIX) 40 MG tablet Take 40 mg by mouth daily. Reported on 08/03/2015    . tizanidine (ZANAFLEX) 2 MG capsule Take by mouth.    . topiramate (TOPAMAX) 50 MG tablet Take 1 tablet in the morning, 2 tablets at night until 8/29. Take 2 tablets in the morning and night thereafter. 30 tablet 0   No current facility-administered medications for this visit.     Neurologic: Headache: No Seizure: Yes Paresthesias:No  Musculoskeletal: Strength & Muscle Tone: within normal limits Gait & Station: off balance constantly, uses cane Patient leans: N/A  Psychiatric Specialty Exam: ROS  Blood pressure 104/71, pulse 83, temperature 97.8 F (36.6 C), temperature source Oral, weight 170 lb 12.8 oz (77.5 kg).Body mass index is 30.26 kg/m.  General Appearance: Casual  Eye Contact:  Fair  Speech:  Clear and Coherent  Volume:  Normal  Mood:  Anxious  Affect:  Appropriate  Thought Process:  Goal Directed  Orientation:  Full (Time, Place, and Person)  Thought Content:  WDL  Suicidal  Thoughts:  No  Homicidal Thoughts:  No  Memory:  Immediate;   Fair Recent;   Fair Remote;   Fair  Judgement:  Intact  Insight:  Fair  Psychomotor Activity:  Normal  Concentration:  Concentration: Fair and Attention Span: Fair  Recall:  [employer] of Knowledge:Fair  Language: Fair  Akathisia:  No  Handed:  Right  AIMS (if indicated):    Assets:  Communication Skills Housing Physical Health Social Support  ADL's:  Intact  Cognition: WNL  Sleep:  5     Treatment Plan Summary: Medication management   Discussed with her about her medications treatment risks benefits and alternatives. She will continue on Lexapro 20 mg daily Continue BuSpar 10 mg TID  I will start her on prazosin 1 mg by mouth daily at bedtime and advised her to monitor her blood pressure on a regular basis and she agreed with the plan. Advised patient to titrate the dose after 2 weeks to 2 mg if she does not notice an improvement in her nightmares and she agreed with the plan. She will call for the medication refill.  Continue  therapy on a weekly basis    Follow-up in 2  Months  or earlier depending on her symptoms    More than 50% of the time spent in psychoeducation, counseling and coordination of care.    This note was generated in part or whole with voice recognition software. Voice regonition is usually quite accurate but there are transcription errors that can and very often do occur. I apologize for any typographical errors that were not detected and corrected.    Rainey Pines, MD 2/7/201810:23 AM
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
## 2016-04-12 NOTE — ED Provider Notes (Signed)
[facility] Emergency Department Provider Note  ____________________________________________  Time seen: Approximately 3:27 PM  I have reviewed the triage vital signs and the nursing notes.   HISTORY  Chief Complaint Seizures    HPI [name] is a 46 y.o. female with a history of seizure disorder recently discontinued from all her seizure medications presenting with seizure. The patient was at clinic when she had a seizure, but I do not have the details from the witnesses. She reports that she was recently hospitalized with a negative EEG and discontinued from her Vimpat and Depakote since December. Prior to being discontinued from her seizure medications, the patient reports approximately 3 breakthrough seizures weekly. Since being off her medications, her seizure frequency has gone down to once weekly, and she reports less associated injury. She also believes her service dog has significantly helped. After the seizure today, the patient did fall and has posterior right-sided scalp pain, mild pain with lateral movement to the right of the neck but no midline pain, right shoulder pain, and an acute on chronic injury in the left ankle over the left lateral malleolus. The patient also has a mild headache. She denies any nausea or vomiting, fever, or any signs or symptoms that would be new or different.   Past Medical History:  Diagnosis Date  . Anxiety   . Arthritis   . Asthma   . Bilateral shoulder pain   . Chronic neck pain   . Depression   . Emphysema/COPD ([place])   . GERD (gastroesophageal reflux disease)   . Migraines   . Nasal septal perforation   . Seizure ([place])   . Sleep apnea   . Stroke [facility])     Patient Active Problem List   Diagnosis Date Noted  . RAD (reactive airway disease), moderate persistent, uncomplicated [id number]  . Acute cystitis without hematuria   . Change in mental status 10/26/2015  . Nonepileptic episode ([place]) 10/26/2015   . Adult BMI 30+ 10/18/2015  . Atopic rhinitis 10/18/2015  . Reactive airway disease without complication [id number]  . Recurrent major depressive disorder, in partial remission ([place]) 10/18/2015  . Snoring 10/18/2015  . Other epilepsy, intractable, without status epilepticus ([place]) 10/04/2015  . Spells of speech arrest 08/10/2015  . Shaking spells 08/10/2015  . Apneic spell 08/10/2015  . Primary osteoarthritis involving multiple joints 07/25/2015  . Hot flashes 07/25/2015  . Chronic pain of both shoulders 06/26/2015  . Spondylosis of cervical region without myelopathy or radiculopathy 06/26/2015  . Pseudoseizure   . Status epilepticus due to refractory epilepsy ([place]) 06/22/2015  . Chronic nonintractable headache 06/04/2015  . Blurry vision, right eye 05/18/2015  . Depression, major, single episode, complete remission ([place]) 05/18/2015  . Renal calculus 05/18/2015  . Pseudoseizures 05/11/2015  . Migraines 05/11/2015  . Abdominal pain 05/10/2015  . Allergy to bee sting 12/01/2014  . Pain in toe of left foot 12/01/2014  . Nausea 12/01/2014  . Gastroesophageal reflux disease without esophagitis 12/01/2014  . Difficulty breathing 10/19/2014  . Seizures ([place]) 05/17/2014  . Respiratory failure, acute ([place]) 05/16/2014  . Depression, unspecified depression type 05/14/2014  . Headache disorder 04/13/2014  . Spells 04/13/2014  . Anxiety 01/07/2014  . Depression 01/07/2014  . Acute headache 12/02/2013  . Rotator cuff (capsule) sprain and strain 11/27/2013  . Midline low back pain without sciatica 10/29/2013  . Localized swelling, mass and lump, neck 10/29/2013  . Seizure disorder ([place]) 10/29/2013  . COPD (chronic obstructive pulmonary disease) ([place])   .  Emphysema/COPD ([place])   . Sleep apnea   . Stroke ([place])   . Pain, chronic 07/11/2013    Past Surgical History:  Procedure Laterality Date  . ABDOMINAL HYSTERECTOMY     partial  . CESAREAN SECTION  1993  . PARTIAL HYSTERECTOMY  1995   . TONSILLECTOMY      Current Outpatient Rx  . Order #: [id number] Class: Historical Med  . Order #: [id number] Class: Historical Med  . Order #: [id number] Class: Historical Med  . Order #: [id number] Class: Normal  . Order #: [id number] Class: Historical Med  . Order #: [id number] Class: Normal  . Order #: [id number] Class: Historical Med  . Order #: [id number] Class: Normal  . Order #: [id number] Class: Historical Med  . Order #: [id number] Class: Historical Med  . Order #: [id number] Class: Historical Med  . Order #: [id number] Class: Normal  . Order #: [id number] Class: Normal    Allergies Bee venom; Shellfish allergy; Contrast media [iodinated diagnostic agents]; Other; Penicillins; and Sulfa antibiotics  Family History  Problem Relation Age of Onset  . Depression Maternal Grandmother   . Hypertension Maternal Grandmother   . Cancer Maternal Grandfather   . Lung cancer Mother   . Depression Mother   . Learning disabilities Sister   . Depression Sister   . Depression Paternal Uncle   . Hypertension Paternal Uncle   . Diabetes Neg Hx     Social History Social History  Substance Use Topics  . Smoking status: Current Every Day Smoker    Types: E-cigarettes  . Smokeless tobacco: Never Used  . Alcohol use No    Review of Systems Constitutional: No fever/chills.No lightheadedness or syncope. Positive fall. Eyes: No visual changes. No blurred or double vision. ENT: No sore throat. No congestion or rhinorrhea. Positive for right lateral neck pain. No midline pain. Cardiovascular: Denies chest pain. Denies palpitations. Respiratory: Denies shortness of breath.  No cough. Gastrointestinal: No abdominal pain.  No nausea, no vomiting.  No diarrhea.  No constipation. Genitourinary: Negative for dysuria. Musculoskeletal: Negative for back pain. Positive for right shoulder pain. Positive for left lateral malleolus pain. Skin: Negative for rash. Neurological: Positive for seizure. Positive for  headaches. No focal numbness, tingling or weakness. Normal gait.  10-point ROS otherwise negative.  ____________________________________________   PHYSICAL EXAM:  VITAL SIGNS: ED Triage Vitals [04/12/16 1223]  Enc Vitals Group     BP (!) 118/101     Pulse Rate 86     Resp 20     Temp 98 F (36.7 C)     Temp Source Oral     SpO2 99 %     Weight 170 lb (77.1 kg)     Height      Head Circumference      Peak Flow      Pain Score      Pain Loc      Pain Edu?      Excl. in [place]?     Constitutional: Alert and oriented. Well appearing and in no acute distress. Answers questions appropriately.She moves about in the stretcher without any difficulty. She is able to ambulate without any limp. Eyes: Conjunctivae are normal.  EOMI. No scleral icterus. Head: Tenderness to palpation in the right posterior scalp without obvious swelling although the patient has a lot of hair which precludes full examination. No overlying laceration or abrasion. No bleeding. No Battle sign or raccoon eyes. Nose: No congestion/rhinnorhea. Mouth/Throat: Mucous membranes are moist. No dental injury, or intraoral injury. Neck: No stridor.  Supple.  No  meningismus. Full range of motion with mild pain in the right lateral neck when looking to the right. No midline C-spine tenderness to palpation, step-offs or deformities. Cardiovascular: Normal rate, regular rhythm. No murmurs, rubs or gallops.  Respiratory: Normal respiratory effort.  No accessory muscle use or retractions. Lungs CTAB.  No wheezes, rales or ronchi. Gastrointestinal: Overweight. Soft, nontender and nondistended.  No guarding or rebound.  No peritoneal signs. Musculoskeletal: Full range of motion with some discomfort in the right shoulder. Normal range of motion of the right elbow and wrist. Normal right radial pulse. Normal sensation to light touch in the right upper extremity. The left ankle has some minimal swelling on the inferior portion of the  lateral malleolus with some discomfort with range of motion. Normal DP and PT pulse on the left. Normal sensation to light touch on the left. Normal range of motion of the left knee and ankle. Neurologic:  A&Ox3.  Speech is clear.  Face and smile are symmetric.  EOMI.  Moves all extremities well. Skin:  Skin is warm, dry and intact. No rash noted.   ____________________________________________   LABS (all labs ordered are listed, but only abnormal results are displayed)  Labs Reviewed  BASIC METABOLIC PANEL - Abnormal; Notable for the following:       Result Value   Chloride 114 (*)    CO2 20 (*)    Glucose, Bld 111 (*)    All other components within normal limits  CBC  CBG MONITORING, ED   ____________________________________________  EKG  ED ECG REPORT I, Eula Listen, the attending physician, personally viewed and interpreted this ECG.   Date: 04/12/2016  EKG Time: 1231  Rate: 76  Rhythm: normal sinus rhythm  Axis: normal  Intervals:none  ST&T Change: Nonspecific T-wave inversion in V1. No ST elevation.  ____________________________________________  [id number]  Dg Shoulder Right  Result Date: 04/12/2016 CLINICAL DATA:  Right shoulder pain secondary to a seizure today. Initial encounter. EXAM: RIGHT SHOULDER - 2+ VIEW COMPARISON:  None. FINDINGS: There is no evidence of fracture or dislocation. There is no evidence of arthropathy or other focal bone abnormality. Soft tissues are unremarkable. IMPRESSION: Negative exam. Electronically Signed   By: Inge Rise M.D.   On: 04/12/2016 16:20   Dg Ankle Complete Left  Result Date: 04/12/2016 CLINICAL DATA:  Left ankle injury secondary to a seizure today. Pain. Initial encounter. EXAM: LEFT ANKLE COMPLETE - 3+ VIEW COMPARISON:  None. FINDINGS: There is no evidence of fracture, dislocation, or joint effusion. There is no evidence of arthropathy or other focal bone abnormality. Soft tissues are unremarkable.  IMPRESSION: Negative exam. Electronically Signed   By: Inge Rise M.D.   On: 04/12/2016 16:21   Ct Head Wo Contrast  Result Date: 04/12/2016 CLINICAL DATA:  Seizure today.  Known seizure disorder. EXAM: CT HEAD WITHOUT CONTRAST TECHNIQUE: Contiguous axial images were obtained from the base of the skull through the vertex without intravenous contrast. COMPARISON:  CT scan dated 06/22/2015 hand 11/08/2013 FINDINGS: Brain: No evidence of acute infarction, hemorrhage, hydrocephalus, extra-axial collection or mass lesion/mass effect. Small focal area of lucency at the inferior aspect of the left external capsule is unchanged since 2015 and may represent an area of chronic small vessel ischemic disease. Vascular: No hyperdense vessel or unexpected calcification. Skull: Normal. Negative for fracture or focal lesion. Sinuses/Orbits: Normal. Other: None IMPRESSION: No acute intracranial abnormality.  No change since the prior exams. Electronically Signed   By: Lorriane Shire M.D.  On: 04/12/2016 15:54    ____________________________________________   PROCEDURES  Procedure(s) performed: None  Procedures  Critical Care performed: No ____________________________________________   INITIAL IMPRESSION / ASSESSMENT AND PLAN / ED COURSE  Pertinent labs & imaging results that were available during my care of the patient were reviewed by me and considered in my medical decision making (see chart for details).  46 y.o. female with a history of seizure disorder recently discontinued from on her antiepileptics presenting with a seizure clinic with resulting left older, right posterior scalp, right lateral neck, and left lateral malleolus injuries. Overall, the patient has reassuring vital signs, and has normal mental status on my examination. She has no focal neurologic deficits. We will do imaging to evaluate for her injuries. I have also made a call to her primary neurologist, Dr. Amalia Hailey, for final  disposition. The patient's laboratory studies are reassuring.  ----------------------------------------- 4:11 PM on 04/12/2016 -----------------------------------------  I spoken with the patient's neurologist, who feels that her episodes are most likely non-epileptiform. The patient will be discharged off of antiepileptics with instructions to continue not to take her antiseizure medications; there is no indication for restarting her medications. I'm awaiting the results of her trauma evaluation, the CT scan does not show any acute intracranial process. If her workup is reassuring, I'll plan discharge home. ____________________________________________  FINAL CLINICAL IMPRESSION(S) / ED DIAGNOSES  Final diagnoses:  Seizure-like activity ([place])  Contusion of scalp, initial encounter  Acute pain of right shoulder  Acute left ankle pain  Neck pain         NEW MEDICATIONS STARTED DURING THIS VISIT:  New Prescriptions   No medications on file      Eula Listen, MD 04/12/16 1654

## 2016-04-20 DIAGNOSIS — R6884 Jaw pain: Secondary | ICD-10-CM | POA: Diagnosis not present

## 2016-04-20 DIAGNOSIS — S0302XA Dislocation of jaw, left side, initial encounter: Secondary | ICD-10-CM | POA: Diagnosis not present

## 2016-04-20 DIAGNOSIS — Z0000 Encounter for general adult medical examination without abnormal findings: Secondary | ICD-10-CM | POA: Diagnosis not present

## 2016-04-26 ENCOUNTER — Ambulatory Visit (INDEPENDENT_AMBULATORY_CARE_PROVIDER_SITE_OTHER): Payer: Medicare HMO | Admitting: Licensed Clinical Social Worker

## 2016-04-26 DIAGNOSIS — F39 Unspecified mood [affective] disorder: Secondary | ICD-10-CM

## 2016-04-26 DIAGNOSIS — F4310 Post-traumatic stress disorder, unspecified: Secondary | ICD-10-CM | POA: Diagnosis not present

## 2016-04-26 NOTE — Progress Notes (Signed)
   THERAPIST PROGRESS NOTE  Session Time: 11:00 AM-11:30 AM  Participation Level: Active  Behavioral Response: CasualAlertEuthymic  Type of Therapy: Individual Therapy  Treatment Goals addressed:  decrease in anxiety, depression and PTSD symptoms  Interventions: Solution Focused, Strength-based, Supportive and Other: Grounding  Summary: [name] is a 46 y.o. female who presents continuing not to do with different issues. Right now something is wrong with TMJ joint and feels like an infection. Patient seized last session so discussed what are triggers for seizures. She relates she's had them since 2010 and doctors have not found anything in the brain. On time she knows it's coming on and sometimes she doesn't. She has an aura and will smell alcohol or fish-like smell. Discussed how last session was intensive and may have been anxiety and stress that led to seizure. Patient is unsure if this is the cause therapist reviewed SUDS the patient can read her distress and as the session becomes to stressful she continues an exercise to help her feel safe and reduced anxiety. Introduced Oncologist. Therapist discussed with patient the helpfulness for her of stress management strategies. Patient says she takes lots of lavender bath and she also has her service dog. She relates Minipress is helping with nightmares but still struggling with sleep and doesn't get to bed till 3 in the morning.   Suicidal/Homicidal: No  Therapist Response: Reviewed progress and symptoms. Anxiety and stress as a source of patient's seizures and reviewed stress management strategies and discussed how this would be helpful invention for patient in managing symptoms. Introduced SUDS scale so she can identify if session is too stressful and introduced grounding as helpful for patient to maintain safety in session when working on  intensive emotional issues such as trauma. LCSW provided patient with ongoing emotional support and  encouragement.  Helped patient to process feelings in session and provided strength based interventions.   Plan: Return again in 2 weeks.2. Therapist continue to work with patient on mood regulation strategies and trauma symptoms  Diagnosis: Axis I:  Episodic Mood Disorder, PTSD    Axis II: No diagnosis    [name] A, LCSW 04/26/2016

## 2016-04-28 ENCOUNTER — Ambulatory Visit
Admission: RE | Admit: 2016-04-28 | Discharge: 2016-04-28 | Disposition: A | Discharge status: Home / Self Care | Payer: Medicare HMO | Source: Ambulatory Visit | Attending: Internal Medicine | Admitting: Internal Medicine

## 2016-04-28 DIAGNOSIS — Z1231 Encounter for screening mammogram for malignant neoplasm of breast: Secondary | ICD-10-CM | POA: Diagnosis not present

## 2016-04-28 DIAGNOSIS — Z1239 Encounter for other screening for malignant neoplasm of breast: Secondary | ICD-10-CM

## 2016-05-11 ENCOUNTER — Ambulatory Visit: Payer: Medicare HMO | Admitting: Licensed Clinical Social Worker

## 2016-05-26 DIAGNOSIS — K08109 Complete loss of teeth, unspecified cause, unspecified class: Secondary | ICD-10-CM | POA: Diagnosis not present

## 2016-05-26 DIAGNOSIS — M26609 Unspecified temporomandibular joint disorder, unspecified side: Secondary | ICD-10-CM | POA: Diagnosis not present

## 2016-05-26 DIAGNOSIS — M26622 Arthralgia of left temporomandibular joint: Secondary | ICD-10-CM | POA: Diagnosis not present

## 2016-05-26 DIAGNOSIS — R51 Headache: Secondary | ICD-10-CM | POA: Diagnosis not present

## 2016-05-26 DIAGNOSIS — M2669 Other specified disorders of temporomandibular joint: Secondary | ICD-10-CM | POA: Diagnosis not present

## 2016-06-16 ENCOUNTER — Other Ambulatory Visit: Payer: Self-pay | Admitting: Psychiatry

## 2016-06-21 DIAGNOSIS — G40804 Other epilepsy, intractable, without status epilepticus: Secondary | ICD-10-CM | POA: Diagnosis not present

## 2016-06-21 DIAGNOSIS — G43119 Migraine with aura, intractable, without status migrainosus: Secondary | ICD-10-CM | POA: Diagnosis not present

## 2016-06-26 ENCOUNTER — Ambulatory Visit: Payer: Medicare HMO | Admitting: Psychiatry

## 2016-07-16 ENCOUNTER — Other Ambulatory Visit: Payer: Self-pay | Admitting: Psychiatry

## 2016-07-24 NOTE — Telephone Encounter (Signed)
PT CALLED STATES SHE MADE APPT FOR 08-07-16 BUT SHE IS OUT OF MEDICATION.  PT STATES SHE IS DOING MUCH BETTER.

## 2016-07-25 NOTE — Telephone Encounter (Signed)
pt called left message on answering services that she needs refills that she is out.  pt was last seen on  04-12-16 next appt  08-07-16

## 2016-08-07 ENCOUNTER — Encounter: Payer: Self-pay | Admitting: Psychiatry

## 2016-08-07 ENCOUNTER — Ambulatory Visit (INDEPENDENT_AMBULATORY_CARE_PROVIDER_SITE_OTHER): Payer: Medicare HMO | Admitting: Psychiatry

## 2016-08-07 VITALS — BP 113/78 | HR 98 | Temp 98.6°F | Wt 159.8 lb

## 2016-08-07 DIAGNOSIS — F4310 Post-traumatic stress disorder, unspecified: Secondary | ICD-10-CM

## 2016-08-07 DIAGNOSIS — F39 Unspecified mood [affective] disorder: Secondary | ICD-10-CM | POA: Diagnosis not present

## 2016-08-07 MED ORDER — BUSPIRONE HCL 10 MG PO TABS: 10.0000 mg | ORAL_TABLET | Freq: Three times a day (TID) | ORAL | 1 refills | Status: DC

## 2016-08-07 MED ORDER — PRAZOSIN HCL 1 MG PO CAPS: 1.0000 mg | ORAL_CAPSULE | Freq: Every day | ORAL | 1 refills | Status: DC

## 2016-08-07 MED ORDER — ESCITALOPRAM OXALATE 20 MG PO TABS: 20.0000 mg | ORAL_TABLET | Freq: Every day | ORAL | 1 refills | Status: DC

## 2016-08-07 MED ORDER — GABAPENTIN 300 MG PO CAPS: 300.0000 mg | ORAL_CAPSULE | Freq: Two times a day (BID) | ORAL | 2 refills | Status: DC

## 2016-08-07 NOTE — Progress Notes (Signed)
Psychiatric MD Progress Note   Patient Identification: [name] MRN:  [id number] Date of Evaluation:  08/07/2016 Referral Source: [place]  Chief Complaint:   Chief Complaint    Follow-up; Medication Refill     Visit Diagnosis:    ICD-9-CM ICD-10-CM   1. Episodic mood disorder ([place]) 296.90 F39   2. PTSD (post-traumatic stress disorder) 309.81 F43.10     History of Present Illness:    Patient is a 46 year old white female with history of pseudoseizures pesented for follow-up. She was Walking normally and reported that she is very excited as she has started using hemp  oil and is not having any seizure-like activity . She reported that she has been training her dog as well. She stated that the one is helping her and she feels very calm and alert. She is also trying to sell the oil over the Internet to her friends. She appeared very happy and excited during the interview. She stated that she has found a miracle cure for her pseudoseizures. We discussed about that in detail. She reported that she has been compliant with her medications. She is also training her dog as a therapy dog and she left him at home. She stated that she feels calm during the daytime. She sleeps well at night. She feels that the current combination of medication has been helping her and her neuropathy is also improving with the help of gabapentin. She is also seeing neurologist was started her on Topamax for migraine headaches. She does not have any side effects of the medications at this time. She denied having any suicidal homicidal ideations or plans.      Associated Signs/Symptoms: Depression Symptoms:  anxiety, loss of energy/fatigue, disturbed sleep, (Hypo) Manic Symptoms:  Labiality of Mood, Anxiety Symptoms:  Excessive Worry, Psychotic Symptoms:  none PTSD Symptoms: Had a traumatic exposure:  Child passed away due to SIDS in 1988/06/19, [place] accident 19-Jun-2009  Past Psychiatric History:  Patient  has depression and anxiety but never received inpatient psychiatric hospitalization or outpatient psychiatric medication management. Patient has received Prozac from primary care physician over several years reportedly maintenance of depression anxiety.  Previous Psychotropic Medications:  Prozac Paxil Zoloft Xanax Valium   Substance Abuse History in the last 12 months:  No.  Consequences of Substance Abuse: Negative NA  Past Medical History:  Past Medical History:  Diagnosis Date  . Anxiety   . Arthritis   . Asthma   . Bilateral shoulder pain   . Chronic neck pain   . Depression   . Emphysema/COPD ([place])   . GERD (gastroesophageal reflux disease)   . Migraines   . Nasal septal perforation   . Seizure ([place])   . Sleep apnea   . Stroke [facility])     Past Surgical History:  Procedure Laterality Date  . ABDOMINAL HYSTERECTOMY     partial  . CESAREAN SECTION  1993  . PARTIAL HYSTERECTOMY  1995  . TONSILLECTOMY      Family Psychiatric History: Patient denied family history of mental illness  Family History:  Family History  Problem Relation Age of Onset  . Depression Maternal Grandmother   . Hypertension Maternal Grandmother   . Cancer Maternal Grandfather   . Lung cancer Mother   . Depression Mother   . Learning disabilities Sister   . Depression Sister   . Depression Paternal Uncle   . Hypertension Paternal Uncle   . Diabetes Neg Hx   . Breast cancer Neg Hx  Social History:   Social History   Social History  . Marital status: Married    Spouse name: N/A  . Number of children: N/A  . Years of education: N/A   Social History Main Topics  . Smoking status: Current Every Day Smoker    Types: E-cigarettes  . Smokeless tobacco: Never Used  . Alcohol use No  . Drug use: No  . Sexual activity: Yes    Partners: Male    Birth control/ protection: Surgical   Other Topics Concern  . None   Social History Narrative   Patient is married and lives at home  with her husband.              Additional Social History:  Married x 3.  This is her 3rd marriage, married x 4 years. Has 2 children.  Husband is a Curator   Allergies:   Allergies  Allergen Reactions  . Bee Venom Anaphylaxis  . Shellfish Allergy Anaphylaxis  . Contrast Media [Iodinated Diagnostic Agents] Other (See Comments)    Unknown  Reaction:  Unknown   . Other Other (See Comments)    Unknown   . Penicillins Other (See Comments)    Reaction:  Seizures  Has patient had a PCN reaction causing immediate rash, facial/tongue/throat swelling, SOB or lightheadedness with hypotension: No Has patient had a PCN reaction causing severe rash involving mucus membranes or skin necrosis: No Has patient had a PCN reaction that required hospitalization Yes Has patient had a PCN reaction occurring within the last 10 years: Yes If all of the above answers are "NO", then may proceed with Cephalosporin use.  . Sulfa Antibiotics Itching    Metabolic Disorder Labs: No results found for: HGBA1C, MPG Lab Results  Component Value Date   PROLACTIN 21.6 06/22/2015   Lab Results  Component Value Date   TRIG 194 (H) 05/15/2014     Current Medications: Current Outpatient Prescriptions  Medication Sig Dispense Refill  . acetaminophen (TYLENOL) 500 MG tablet Take 1,000 mg by mouth every 6 (six) hours as needed for mild pain or headache.    . albuterol (PROVENTIL HFA;VENTOLIN HFA) 108 (90 BASE) MCG/ACT inhaler Inhale 2 puffs into the lungs every 6 (six) hours as needed for wheezing or shortness of breath.     Marland Kitchen aspirin EC 81 MG tablet Take 81 mg by mouth daily.    . busPIRone (BUSPAR) 10 MG tablet Take 1 tablet (10 mg total) by mouth 3 (three) times daily. 90 tablet 1  . busPIRone (BUSPAR) 10 MG tablet TAKE ONE TABLET BY MOUTH THREE TIMES DAILY 90 tablet 1  . escitalopram (LEXAPRO) 20 MG tablet TAKE ONE TABLET BY MOUTH ONCE DAILY 30 tablet 1  . FLOVENT HFA 110 MCG/ACT inhaler Inhale 1 puff  into the lungs 2 (two) times daily.    Marland Kitchen gabapentin (NEURONTIN) 300 MG capsule Take 1 capsule (300 mg total) by mouth 2 (two) times daily. 60 capsule 1  . meloxicam (MOBIC) 7.5 MG tablet Take 7.5-15 mg by mouth daily as needed for pain.     . Multiple Vitamin (MULTIVITAMIN) tablet Take 1 tablet by mouth daily.    . pantoprazole (PROTONIX) 40 MG tablet Take 40 mg by mouth daily. Reported on 08/03/2015    . prazosin (MINIPRESS) 1 MG capsule TAKE ONE CAPSULE BY MOUTH AT BEDTIME 30 capsule 1  . topiramate (TOPAMAX) 50 MG tablet Take 1 tablet in the morning, 2 tablets at night until 8/29. Take 2 tablets in  the morning and night thereafter. 30 tablet 0  . dicyclomine (BENTYL) 10 MG capsule Take 10 mg by mouth. Reported on 09/09/2015     No current facility-administered medications for this visit.     Neurologic: Headache: No Seizure: Yes Paresthesias:No  Musculoskeletal: Strength & Muscle Tone: within normal limits Gait & Station: normal Patient leans: N/A  Psychiatric Specialty Exam: ROS  Blood pressure 113/78, pulse 98, temperature 98.6 F (37 C), temperature source Oral, weight 159 lb 12.8 oz (72.5 kg).Body mass index is 28.31 kg/m.  General Appearance: Casual  Eye Contact:  Fair  Speech:  Clear and Coherent  Volume:  Normal  Mood:  Anxious  Affect:  Appropriate  Thought Process:  Goal Directed  Orientation:  Full (Time, Place, and Person)  Thought Content:  WDL  Suicidal Thoughts:  No  Homicidal Thoughts:  No  Memory:  Immediate;   Fair Recent;   Fair Remote;   Fair  Judgement:  Intact  Insight:  Fair  Psychomotor Activity:  Normal  Concentration:  Concentration: Fair and Attention Span: Fair  Recall:  [employer] of Knowledge:Fair  Language: Fair  Akathisia:  No  Handed:  Right  AIMS (if indicated):    Assets:  Communication Skills Housing Physical Health Social Support  ADL's:  Intact  Cognition: WNL  Sleep:  5     Treatment Plan Summary: Medication management    Discussed with her about her medications treatment risks benefits and alternatives. She will continue on Lexapro 20 mg daily Continue BuSpar 10 mg TID  Continue  prazosin 1 mg by mouth daily at bedtime   Continue  therapy on a weekly basis    Follow-up in 2  Months  or earlier depending on her symptoms    More than 50% of the time spent in psychoeducation, counseling and coordination of care.    This note was generated in part or whole with voice recognition software. Voice regonition is usually quite accurate but there are transcription errors that can and very often do occur. I apologize for any typographical errors that were not detected and corrected.    Rainey Pines, MD 6/4/20182:36 PM

## 2016-08-25 ENCOUNTER — Ambulatory Visit (INDEPENDENT_AMBULATORY_CARE_PROVIDER_SITE_OTHER): Payer: Medicare HMO | Admitting: Licensed Clinical Social Worker

## 2016-08-25 DIAGNOSIS — F39 Unspecified mood [affective] disorder: Secondary | ICD-10-CM

## 2016-08-25 NOTE — Progress Notes (Signed)
   THERAPIST PROGRESS NOTE  Session Time: 11:00 AM-11:45 AM  Participation Level: Active  Behavioral Response: CasualAlertEuthymic  Type of Therapy: Individual Therapy  Treatment Goals addressed:  decrease in anxiety, depression and PTSD symptoms  Interventions: Solution Focused, Strength-based, Supportive and Other: Grounding  Summary: [name] is a 46 y.o. female who presents with continued symptoms of her diagnosis.  Obtained an brief background history with Patient.  Patient was able to discuss what she has learned with previous therapist.  Patient reports that her symptoms has reduced since taking [employer]x 750.  SHe reports that she has an emotional support animal to help with anxiety and difficulty emotions.  Reports that she motivated to overcome her trauma and anxiety.   Suicidal/Homicidal: No  Therapist Response: Reviewed progress and symptoms. LCSW provided patient with ongoing emotional support and encouragement.  Helped patient to process feelings in session and provided strength based interventions.   Plan: Return again in 2 weeks.2. Therapist continue to work with patient on mood regulation strategies and trauma symptoms  Diagnosis: Axis I:  Episodic Mood Disorder, PTSD    Axis II: No diagnosis    Lubertha South, LCSW 08/25/2016

## 2016-09-05 ENCOUNTER — Ambulatory Visit: Payer: Medicare HMO | Admitting: Licensed Clinical Social Worker

## 2016-09-19 ENCOUNTER — Ambulatory Visit: Payer: Medicare HMO | Admitting: Licensed Clinical Social Worker

## 2016-10-26 ENCOUNTER — Emergency Department
Admission: EM | Admit: 2016-10-26 | Discharge: 2016-10-26 | Disposition: A | Discharge status: Home / Self Care | Payer: Medicare HMO | Attending: Emergency Medicine | Admitting: Emergency Medicine

## 2016-10-26 ENCOUNTER — Encounter: Payer: Self-pay | Admitting: Emergency Medicine

## 2016-10-26 DIAGNOSIS — Z79899 Other long term (current) drug therapy: Secondary | ICD-10-CM | POA: Insufficient documentation

## 2016-10-26 DIAGNOSIS — B088 Other specified viral infections characterized by skin and mucous membrane lesions: Secondary | ICD-10-CM | POA: Insufficient documentation

## 2016-10-26 DIAGNOSIS — J449 Chronic obstructive pulmonary disease, unspecified: Secondary | ICD-10-CM | POA: Diagnosis not present

## 2016-10-26 DIAGNOSIS — Z7982 Long term (current) use of aspirin: Secondary | ICD-10-CM | POA: Diagnosis not present

## 2016-10-26 DIAGNOSIS — F17290 Nicotine dependence, other tobacco product, uncomplicated: Secondary | ICD-10-CM | POA: Diagnosis not present

## 2016-10-26 DIAGNOSIS — B0860 Parapoxvirus infection, unspecified: Secondary | ICD-10-CM | POA: Diagnosis not present

## 2016-10-26 DIAGNOSIS — J45909 Unspecified asthma, uncomplicated: Secondary | ICD-10-CM | POA: Diagnosis not present

## 2016-10-26 DIAGNOSIS — R21 Rash and other nonspecific skin eruption: Secondary | ICD-10-CM | POA: Diagnosis present

## 2016-10-26 MED ORDER — DIPHENHYDRAMINE HCL 50 MG/ML IJ SOLN
25.0000 mg | Freq: Once | INTRAMUSCULAR | Status: AC
  Administered 2016-10-26: 25 mg via INTRAMUSCULAR
  Filled 2016-10-26: qty 1

## 2016-10-26 MED ORDER — METHYLPREDNISOLONE SODIUM SUCC 125 MG IJ SOLR
125.0000 mg | Freq: Once | INTRAMUSCULAR | Status: AC
  Administered 2016-10-26: 125 mg via INTRAMUSCULAR
  Filled 2016-10-26: qty 2

## 2016-10-26 MED ORDER — PREDNISONE 50 MG PO TABS: ORAL_TABLET | ORAL | 0 refills | Status: AC

## 2016-10-26 MED ORDER — DIPHENHYDRAMINE HCL 25 MG PO CAPS: 25.0000 mg | ORAL_CAPSULE | ORAL | 0 refills | Status: DC | PRN

## 2016-10-26 MED ORDER — TRIAMCINOLONE ACETONIDE 0.025 % EX OINT: 1.0000 "application " | TOPICAL_OINTMENT | Freq: Two times a day (BID) | CUTANEOUS | 0 refills | Status: DC

## 2016-10-26 NOTE — ED Provider Notes (Signed)
[facility] Emergency Department Provider Note  ____________________________________________  Time seen: Approximately 5:17 PM  I have reviewed the triage vital signs and the nursing notes.   HISTORY  Chief Complaint Rash    HPI [name] is a 46 y.o. female that presents to the emergency department with rash covering her body for one day. She states that rash itches, burns, and hurts. It started out on her left side and is now spreading to her arms, legs, back. She has also felt a bit tired recently. No one has a similar rash. She does not recall being bitten by any insects or ticks. She states that she does not spend a lot of time outside. She had a bad case of chickenpox as a child. No recent illness. She believes that she had all of her vaccinations as a child.She took Benadryl this morning, which did not help. No shortness of breath, chest pain, nausea, vomiting, abdominal    Past Medical History:  Diagnosis Date  . Anxiety   . Arthritis   . Asthma   . Bilateral shoulder pain   . Chronic neck pain   . Depression   . Emphysema/COPD ([employer])   . GERD (gastroesophageal reflux disease)   . Migraines   . Nasal septal perforation   . Seizure ([place])   . Sleep apnea   . Stroke [facility])     Patient Active Problem List   Diagnosis Date Noted  . RAD (reactive airway disease), moderate persistent, uncomplicated 27/25/3664  . Acute cystitis without hematuria   . Change in mental status 10/26/2015  . Nonepileptic episode ([place]) 10/26/2015  . Adult BMI 30+ 10/18/2015  . Atopic rhinitis 10/18/2015  . Reactive airway disease without complication 40/34/7425  . Recurrent major depressive disorder, in partial remission ([place]) 10/18/2015  . Snoring 10/18/2015  . Other epilepsy, intractable, without status epilepticus ([place]) 10/04/2015  . Spells of speech arrest 08/10/2015  . Shaking spells 08/10/2015  . Apneic spell 08/10/2015  . Primary osteoarthritis  involving multiple joints 07/25/2015  . Hot flashes 07/25/2015  . Chronic pain of both shoulders 06/26/2015  . Spondylosis of cervical region without myelopathy or radiculopathy 06/26/2015  . Pseudoseizure   . Status epilepticus due to refractory epilepsy ([place]) 06/22/2015  . Chronic nonintractable headache 06/04/2015  . Blurry vision, right eye 05/18/2015  . Depression, major, single episode, complete remission ([place]) 05/18/2015  . Renal calculus 05/18/2015  . Pseudoseizures 05/11/2015  . Migraines 05/11/2015  . Abdominal pain 05/10/2015  . Allergy to bee sting 12/01/2014  . Pain in toe of left foot 12/01/2014  . Nausea 12/01/2014  . Gastroesophageal reflux disease without esophagitis 12/01/2014  . Difficulty breathing 10/19/2014  . Seizures ([place]) 05/17/2014  . Respiratory failure, acute ([place]) 05/16/2014  . Depression, unspecified depression type 05/14/2014  . Headache disorder 04/13/2014  . Spells 04/13/2014  . Anxiety 01/07/2014  . Depression 01/07/2014  . Acute headache 12/02/2013  . Rotator cuff (capsule) sprain and strain 11/27/2013  . Midline low back pain without sciatica 10/29/2013  . Localized swelling, mass and lump, neck 10/29/2013  . Seizure disorder ([place]) 10/29/2013  . COPD (chronic obstructive pulmonary disease) ([place])   . Emphysema/COPD ([place])   . Sleep apnea   . Stroke ([place])   . Pain, chronic 07/11/2013    Past Surgical History:  Procedure Laterality Date  . ABDOMINAL HYSTERECTOMY     partial  . CESAREAN SECTION  1993  . PARTIAL HYSTERECTOMY  1995  . TONSILLECTOMY  Prior to Admission medications   Medication Sig Start Date End Date Taking? Authorizing Provider  acetaminophen (TYLENOL) 500 MG tablet Take 1,000 mg by mouth every 6 (six) hours as needed for mild pain or headache.    [provider]  albuterol (PROVENTIL HFA;VENTOLIN HFA) 108 (90 BASE) MCG/ACT inhaler Inhale 2 puffs into the lungs every 6 (six) hours as needed for wheezing or  shortness of breath.     [provider]  aspirin EC 81 MG tablet Take 81 mg by mouth daily.    [provider]  busPIRone (BUSPAR) 10 MG tablet Take 1 tablet (10 mg total) by mouth 3 (three) times daily. 08/07/16   Rainey Pines, MD  dicyclomine (BENTYL) 10 MG capsule Take 10 mg by mouth. Reported on 09/09/2015 07/14/15 07/13/16  [provider]  diphenhydrAMINE (BENADRYL) 25 mg capsule Take 1 capsule (25 mg total) by mouth every 4 (four) hours as needed. 10/26/16 10/26/17  Laban Emperor, PA-C  escitalopram (LEXAPRO) 20 MG tablet Take 1 tablet (20 mg total) by mouth daily. 08/07/16   Rainey Pines, MD  FLOVENT HFA 110 MCG/ACT inhaler Inhale 1 puff into the lungs 2 (two) times daily. 10/18/15   [provider]  gabapentin (NEURONTIN) 300 MG capsule Take 1 capsule (300 mg total) by mouth 2 (two) times daily. 08/07/16   Rainey Pines, MD  meloxicam (MOBIC) 7.5 MG tablet Take 7.5-15 mg by mouth daily as needed for pain.     [provider]  Multiple Vitamin (MULTIVITAMIN) tablet Take 1 tablet by mouth daily.    [provider]  pantoprazole (PROTONIX) 40 MG tablet Take 40 mg by mouth daily. Reported on 08/03/2015    [provider]  prazosin (MINIPRESS) 1 MG capsule Take 1 capsule (1 mg total) by mouth at bedtime. 08/07/16   Rainey Pines, MD  predniSONE (DELTASONE) 50 MG tablet Take 1 daily 10/27/16 10/31/16  Laban Emperor, PA-C  topiramate (TOPAMAX) 50 MG tablet Take 1 tablet in the morning, 2 tablets at night until 8/29. Take 2 tablets in the morning and night thereafter. 10/28/15   Riccardo Dubin, MD  triamcinolone (KENALOG) 0.025 % ointment Apply 1 application topically 2 (two) times daily. 10/26/16   Laban Emperor, PA-C    Allergies Bee venom; Shellfish allergy; Contrast media [iodinated diagnostic agents]; Other; Penicillins; and Sulfa antibiotics  Family History  Problem Relation Age of Onset  . Depression Maternal Grandmother   . Hypertension  Maternal Grandmother   . Cancer Maternal Grandfather   . Lung cancer Mother   . Depression Mother   . Learning disabilities Sister   . Depression Sister   . Depression Paternal Uncle   . Hypertension Paternal Uncle   . Diabetes Neg Hx   . Breast cancer Neg Hx     Social History Social History  Substance Use Topics  . Smoking status: Current Every Day Smoker    Types: E-cigarettes  . Smokeless tobacco: Never Used  . Alcohol use No     Review of Systems  Constitutional: No fever/chills Cardiovascular: No chest pain. Respiratory: No SOB. Gastrointestinal: No abdominal pain.  No nausea, no vomiting.  Musculoskeletal: Negative for musculoskeletal pain. Skin: Negative for abrasions, lacerations, ecchymosis. Neurological: Negative for headaches    ____________________________________________   PHYSICAL EXAM:  VITAL SIGNS: ED Triage Vitals  Enc Vitals Group     BP 10/26/16 1634 114/80     Pulse Rate 10/26/16 1634 89     Resp --  Temp 10/26/16 1634 97.8 F (36.6 C)     Temp Source 10/26/16 1634 Oral     SpO2 10/26/16 1634 97 %     Weight 10/26/16 1634 160 lb (72.6 kg)     Height 10/26/16 1634 5\' 3"  (1.6 m)     Head Circumference --      Peak Flow --      Pain Score 10/26/16 1633 8     Pain Loc --      Pain Edu? --      Excl. in [place]? --      Constitutional: Alert and oriented. Well appearing and in no acute distress. Eyes: Conjunctivae are normal. PERRL. EOMI. Head: Atraumatic. ENT:      Ears:      Nose: No congestion/rhinnorhea.      Mouth/Throat: Mucous membranes are moist.  Neck: No stridor.  Cardiovascular: Normal rate, regular rhythm.  Good peripheral circulation. Respiratory: Normal respiratory effort without tachypnea or retractions. Lungs CTAB. Good air entry to the bases with no decreased or absent breath sounds. Musculoskeletal: Full range of motion to all extremities. No gross deformities appreciated. Neurologic:  Normal speech and language. No  gross focal neurologic deficits are appreciated.  Skin:  Skin is warm, dry and intact. Blister-like red vesicles to left flank, genitals, arms, legs.     ____________________________________________   LABS (all labs ordered are listed, but only abnormal results are displayed)  Labs Reviewed - No data to display ____________________________________________  EKG   ____________________________________________  RADIOLOGY  No results found.  ____________________________________________    PROCEDURES  Procedure(s) performed:    Procedures    Medications  methylPREDNISolone sodium succinate (SOLU-MEDROL) 125 mg/2 mL injection 125 mg (125 mg Intramuscular Given 10/26/16 1734)  diphenhydrAMINE (BENADRYL) injection 25 mg (25 mg Intramuscular Given 10/26/16 1735)     ____________________________________________   INITIAL IMPRESSION / ASSESSMENT AND PLAN / ED COURSE  Pertinent labs & imaging results that were available during my care of the patient were reviewed by me and considered in my medical decision making (see chart for details).  Review of the [place] CSRS was performed in accordance of the [place] prior to dispensing any controlled drugs.   Patient presented to the emergency department for evaluation of rash for 1 day. Vital signs and exam are reassuring. Rash has a similar appearance to chickenpox. It is in multiple dermatomes and not consistent with shingles. Rash appears viral. Patient will be discharged home with prescriptions for prednisone, Benadryl, triamcinolone. Patient is to follow up with PCP as directed. Patient is given ED precautions to return to the ED for any worsening or new symptoms.     ____________________________________________  FINAL CLINICAL IMPRESSION(S) / ED DIAGNOSES  Final diagnoses:  Infection, poxvirus      NEW MEDICATIONS STARTED DURING THIS VISIT:  Discharge Medication List as of 10/26/2016  6:06 PM    START taking these  medications   Details  diphenhydrAMINE (BENADRYL) 25 mg capsule Take 1 capsule (25 mg total) by mouth every 4 (four) hours as needed., Starting Thu 10/26/2016, Until Fri 10/26/2017, Print    predniSONE (DELTASONE) 50 MG tablet Take 1 daily, Print    triamcinolone (KENALOG) 0.025 % ointment Apply 1 application topically 2 (two) times daily., Starting Thu 10/26/2016, Print            This chart was dictated using voice recognition software/Dragon. Despite best efforts to proofread, errors can occur which can change the meaning. Any change was purely unintentional.  Laban Emperor, PA-C 10/26/16 1843    Laban Emperor, PA-C 10/26/16 1844    Hinda Kehr, MD 10/26/16 2012

## 2016-10-26 NOTE — ED Notes (Signed)
Pt states has a rash mostly on L side of body. States painful and itchy. Denies hx of shingles but states "my doctor told me I had the worst case of chickenpox he had ever seen." pt states rash looks like chickenpox to her. Alert, oriented, ambulatory. Given gown to undress so rash could be inspected.

## 2016-10-26 NOTE — ED Triage Notes (Signed)
Pt with rash on left flank, abd, groin that started yesterday.

## 2016-10-30 ENCOUNTER — Ambulatory Visit: Payer: Medicare HMO | Admitting: Psychiatry

## 2016-11-08 DIAGNOSIS — G43119 Migraine with aura, intractable, without status migrainosus: Secondary | ICD-10-CM | POA: Diagnosis not present

## 2017-01-14 ENCOUNTER — Other Ambulatory Visit: Payer: Self-pay | Admitting: Psychiatry

## 2017-06-01 DIAGNOSIS — R946 Abnormal results of thyroid function studies: Secondary | ICD-10-CM | POA: Diagnosis not present

## 2017-06-01 DIAGNOSIS — Z0000 Encounter for general adult medical examination without abnormal findings: Secondary | ICD-10-CM | POA: Diagnosis not present

## 2017-06-01 DIAGNOSIS — R7989 Other specified abnormal findings of blood chemistry: Secondary | ICD-10-CM | POA: Diagnosis not present

## 2017-06-07 ENCOUNTER — Other Ambulatory Visit: Payer: Self-pay | Admitting: Internal Medicine

## 2017-06-07 DIAGNOSIS — R748 Abnormal levels of other serum enzymes: Secondary | ICD-10-CM

## 2017-06-15 ENCOUNTER — Ambulatory Visit
Admission: RE | Admit: 2017-06-15 | Discharge: 2017-06-15 | Disposition: A | Discharge status: Home / Self Care | Payer: Medicare HMO | Source: Ambulatory Visit | Attending: Internal Medicine | Admitting: Internal Medicine

## 2017-06-15 DIAGNOSIS — R748 Abnormal levels of other serum enzymes: Secondary | ICD-10-CM

## 2017-07-20 ENCOUNTER — Other Ambulatory Visit: Payer: Self-pay | Admitting: Psychiatry

## 2017-11-04 ENCOUNTER — Encounter: Payer: Self-pay | Admitting: Emergency Medicine

## 2017-11-04 ENCOUNTER — Inpatient Hospital Stay: Payer: Medicare HMO

## 2017-11-04 ENCOUNTER — Emergency Department: Payer: Medicare HMO

## 2017-11-04 ENCOUNTER — Inpatient Hospital Stay
Admission: EM | Admit: 2017-11-04 | Discharge: 2017-11-05 | DRG: 103 | Disposition: A | Discharge status: Home / Self Care | Payer: Medicare HMO | Attending: Internal Medicine | Admitting: Internal Medicine

## 2017-11-04 DIAGNOSIS — Z79899 Other long term (current) drug therapy: Secondary | ICD-10-CM

## 2017-11-04 DIAGNOSIS — Z90711 Acquired absence of uterus with remaining cervical stump: Secondary | ICD-10-CM

## 2017-11-04 DIAGNOSIS — G40909 Epilepsy, unspecified, not intractable, without status epilepticus: Secondary | ICD-10-CM | POA: Diagnosis present

## 2017-11-04 DIAGNOSIS — Z8673 Personal history of transient ischemic attack (TIA), and cerebral infarction without residual deficits: Secondary | ICD-10-CM

## 2017-11-04 DIAGNOSIS — Z791 Long term (current) use of non-steroidal anti-inflammatories (NSAID): Secondary | ICD-10-CM

## 2017-11-04 DIAGNOSIS — G43109 Migraine with aura, not intractable, without status migrainosus: Principal | ICD-10-CM | POA: Diagnosis present

## 2017-11-04 DIAGNOSIS — R200 Anesthesia of skin: Secondary | ICD-10-CM | POA: Diagnosis not present

## 2017-11-04 DIAGNOSIS — Z882 Allergy status to sulfonamides status: Secondary | ICD-10-CM

## 2017-11-04 DIAGNOSIS — Z91030 Bee allergy status: Secondary | ICD-10-CM | POA: Diagnosis not present

## 2017-11-04 DIAGNOSIS — R29818 Other symptoms and signs involving the nervous system: Secondary | ICD-10-CM | POA: Diagnosis not present

## 2017-11-04 DIAGNOSIS — M1991 Primary osteoarthritis, unspecified site: Secondary | ICD-10-CM | POA: Diagnosis not present

## 2017-11-04 DIAGNOSIS — Z880 Allergy status to penicillin: Secondary | ICD-10-CM

## 2017-11-04 DIAGNOSIS — I6389 Other cerebral infarction: Secondary | ICD-10-CM | POA: Diagnosis not present

## 2017-11-04 DIAGNOSIS — Z91041 Radiographic dye allergy status: Secondary | ICD-10-CM | POA: Diagnosis not present

## 2017-11-04 DIAGNOSIS — Z7982 Long term (current) use of aspirin: Secondary | ICD-10-CM

## 2017-11-04 DIAGNOSIS — R55 Syncope and collapse: Secondary | ICD-10-CM

## 2017-11-04 DIAGNOSIS — Z91013 Allergy to seafood: Secondary | ICD-10-CM

## 2017-11-04 DIAGNOSIS — G43409 Hemiplegic migraine, not intractable, without status migrainosus: Secondary | ICD-10-CM | POA: Diagnosis not present

## 2017-11-04 DIAGNOSIS — I639 Cerebral infarction, unspecified: Secondary | ICD-10-CM

## 2017-11-04 DIAGNOSIS — F17290 Nicotine dependence, other tobacco product, uncomplicated: Secondary | ICD-10-CM | POA: Diagnosis not present

## 2017-11-04 DIAGNOSIS — J45909 Unspecified asthma, uncomplicated: Secondary | ICD-10-CM | POA: Diagnosis not present

## 2017-11-04 DIAGNOSIS — Z801 Family history of malignant neoplasm of trachea, bronchus and lung: Secondary | ICD-10-CM | POA: Diagnosis not present

## 2017-11-04 DIAGNOSIS — R51 Headache: Secondary | ICD-10-CM | POA: Diagnosis not present

## 2017-11-04 DIAGNOSIS — Z87892 Personal history of anaphylaxis: Secondary | ICD-10-CM | POA: Diagnosis not present

## 2017-11-04 DIAGNOSIS — G459 Transient cerebral ischemic attack, unspecified: Secondary | ICD-10-CM

## 2017-11-04 DIAGNOSIS — R29810 Facial weakness: Secondary | ICD-10-CM | POA: Diagnosis not present

## 2017-11-04 DIAGNOSIS — R4781 Slurred speech: Secondary | ICD-10-CM | POA: Diagnosis not present

## 2017-11-04 DIAGNOSIS — G43909 Migraine, unspecified, not intractable, without status migrainosus: Secondary | ICD-10-CM | POA: Diagnosis not present

## 2017-11-04 LAB — DIFFERENTIAL
Basophils Absolute: 0 10*3/uL (ref 0–0.1)
Basophils Relative: 1 %
Eosinophils Absolute: 0.1 10*3/uL (ref 0–0.7)
Eosinophils Relative: 1 %
LYMPHS ABS: 2.2 10*3/uL (ref 1.0–3.6)
Lymphocytes Relative: 36 %
MONO ABS: 0.7 10*3/uL (ref 0.2–0.9)
MONOS PCT: 11 %
Neutro Abs: 3.2 10*3/uL (ref 1.4–6.5)
Neutrophils Relative %: 51 %

## 2017-11-04 LAB — COMPREHENSIVE METABOLIC PANEL
ALT: 36 U/L (ref 0–44)
AST: 47 U/L — ABNORMAL HIGH (ref 15–41)
Albumin: 4 g/dL (ref 3.5–5.0)
Alkaline Phosphatase: 105 U/L (ref 38–126)
Anion gap: 5 (ref 5–15)
BILIRUBIN TOTAL: 0.4 mg/dL (ref 0.3–1.2)
BUN: 9 mg/dL (ref 6–20)
CALCIUM: 9 mg/dL (ref 8.9–10.3)
CO2: 28 mmol/L (ref 22–32)
CREATININE: 0.61 mg/dL (ref 0.44–1.00)
Chloride: 109 mmol/L (ref 98–111)
GFR calc non Af Amer: >60 mL/min (ref >60)
Glucose, Bld: 112 mg/dL — ABNORMAL HIGH (ref 70–99)
Potassium: 3 mmol/L — ABNORMAL LOW (ref 3.5–5.1)
Sodium: 142 mmol/L (ref 135–145)
TOTAL PROTEIN: 6.7 g/dL (ref 6.5–8.1)

## 2017-11-04 LAB — CBC
HCT: 37.9 % (ref 35.0–47.0)
Hemoglobin: 13.4 g/dL (ref 12.0–16.0)
MCH: 30.2 pg (ref 26.0–34.0)
MCHC: 35.4 g/dL (ref 32.0–36.0)
MCV: 85.2 fL (ref 80.0–100.0)
Platelets: 278 10*3/uL (ref 150–440)
RBC: 4.45 MIL/uL (ref 3.80–5.20)
RDW: 12.7 % (ref 11.5–14.5)
WBC: 6.3 10*3/uL (ref 3.6–11.0)

## 2017-11-04 LAB — PROTIME-INR
INR: 1
Prothrombin Time: 13.1 seconds (ref 11.4–15.2)

## 2017-11-04 LAB — APTT: aPTT: 29 seconds (ref 24–36)

## 2017-11-04 LAB — TROPONIN I: Troponin I: <0.03 ng/mL (ref <0.03)

## 2017-11-04 MED ORDER — ASPIRIN 81 MG PO CHEW
324.0000 mg | CHEWABLE_TABLET | Freq: Once | ORAL | Status: AC
  Administered 2017-11-04: 324 mg via ORAL
  Filled 2017-11-04: qty 4

## 2017-11-04 MED ORDER — ACETAMINOPHEN 325 MG PO TABS
ORAL_TABLET | ORAL | Status: AC
  Administered 2017-11-04: 650 mg via ORAL
  Filled 2017-11-04: qty 2

## 2017-11-04 MED ORDER — METOCLOPRAMIDE HCL 5 MG/ML IJ SOLN
10.0000 mg | Freq: Once | INTRAMUSCULAR | Status: AC
  Administered 2017-11-04: 10 mg via INTRAVENOUS
  Filled 2017-11-04: qty 2

## 2017-11-04 MED ORDER — ACETAMINOPHEN 650 MG RE SUPP: 650.0000 mg | RECTAL | Status: DC | PRN

## 2017-11-04 MED ORDER — KETOROLAC TROMETHAMINE 30 MG/ML IJ SOLN
15.0000 mg | Freq: Once | INTRAMUSCULAR | Status: AC
  Administered 2017-11-04: 15 mg via INTRAVENOUS
  Filled 2017-11-04: qty 1

## 2017-11-04 MED ORDER — SODIUM CHLORIDE 0.9 % IV BOLUS
1000.0000 mL | Freq: Once | INTRAVENOUS | Status: AC
  Administered 2017-11-04: 1000 mL via INTRAVENOUS

## 2017-11-04 MED ORDER — ACETAMINOPHEN 325 MG PO TABS
650.0000 mg | ORAL_TABLET | ORAL | Status: DC | PRN
  Administered 2017-11-04: 650 mg via ORAL

## 2017-11-04 MED ORDER — ACETAMINOPHEN 160 MG/5ML PO SOLN
650.0000 mg | ORAL | Status: DC | PRN
  Filled 2017-11-04: qty 20.3

## 2017-11-04 NOTE — ED Notes (Signed)
First Nurse Note: Pt c/o right sided facial numbness and right arm numbness that started "a couple of days ago". Slight right sided facial droop noted. Pt also c/o nausea.   Pt husband states that pt had unwitnessed fall last night. Unsure if she had a seizure but pt does have hx/o seizures.

## 2017-11-04 NOTE — H&P (Addendum)
[facility] [place] NAME: [name]    MR#:  [id number]  DATE OF BIRTH:  1970-08-15  DATE OF ADMISSION:  11/04/2017  PRIMARY CARE PHYSICIAN: Glendon Axe, MD   REQUESTING/REFERRING PHYSICIAN:   CHIEF COMPLAINT:   Chief Complaint  Patient presents with  . Numbness  . Headache    HISTORY OF PRESENT ILLNESS: [name]  is a 47 y.o. female with a known history of remote stroke at age 18, without residual symptoms, tobacco/nicotine abuse, migraine headaches, seizures. Patient was brought to the hospital for acute onset of slurred speech, right facial and right arm numbness.  Patient also complains of severe migraine headache, affecting mainly the left side of the head, started even before the slurred speech and numbness.  No vision changes, no focal weakness.  She denies using alcohol or illicit drugs. Currently, during my evaluation in the emergency room, patient feels better status post pain medication.  Her headache is almost completely resolved and her arm numbness is completely resolved.  However, her speech is still slurred and she still complains of right facial numbness. Blood test done in the emergency room, including CBC and CMP are grossly unremarkable, except for low potassium at 3. CT of the head is negative for acute intracranial abnormalities. Patient is admitted for further evaluation and treatment.  PAST MEDICAL HISTORY:   Past Medical History:  Diagnosis Date  . Anxiety   . Arthritis   . Asthma   . Bilateral shoulder pain   . Chronic neck pain   . Depression   . GERD (gastroesophageal reflux disease)   . Migraines   . Nasal septal perforation   . Seizure ([place])   . Sleep apnea   . Stroke [facility])     PAST SURGICAL HISTORY:  Past Surgical History:  Procedure Laterality Date  . ABDOMINAL HYSTERECTOMY     partial  . CESAREAN SECTION  1993  . PARTIAL HYSTERECTOMY  1995  . TONSILLECTOMY       SOCIAL HISTORY:  Social History   Tobacco Use  . Smoking status: Current Every Day Smoker    Types: E-cigarettes  . Smokeless tobacco: Never Used  Substance Use Topics  . Alcohol use: No    FAMILY HISTORY:  Family History  Problem Relation Age of Onset  . Depression Maternal Grandmother   . Hypertension Maternal Grandmother   . Cancer Maternal Grandfather   . Lung cancer Mother   . Depression Mother   . Learning disabilities Sister   . Depression Sister   . Depression Paternal Uncle   . Hypertension Paternal Uncle   . Diabetes Neg Hx   . Breast cancer Neg Hx     DRUG ALLERGIES:  Allergies  Allergen Reactions  . Bee Venom Anaphylaxis  . Shellfish Allergy Anaphylaxis  . Contrast Media [Iodinated Diagnostic Agents] Other (See Comments)    Unknown  Reaction:  Unknown   . Other Other (See Comments)    Unknown   . Penicillins Other (See Comments)    Reaction:  Seizures  Has patient had a PCN reaction causing immediate rash, facial/tongue/throat swelling, SOB or lightheadedness with hypotension: No Has patient had a PCN reaction causing severe rash involving mucus membranes or skin necrosis: No Has patient had a PCN reaction that required hospitalization Yes Has patient had a PCN reaction occurring within the last 10 years: Yes If all of the above answers are "NO", then may proceed with Cephalosporin use.  Marland Kitchen  Sulfa Antibiotics Itching    REVIEW OF SYSTEMS:   CONSTITUTIONAL: No fever, but positive for fatigue and generalized weakness.  EYES: No changes in vision.  EARS, NOSE, AND THROAT: No tinnitus or ear pain.  RESPIRATORY: No cough, shortness of breath, wheezing or hemoptysis.  CARDIOVASCULAR: No chest pain, orthopnea, edema.  GASTROINTESTINAL: No nausea, vomiting, diarrhea or abdominal pain.  GENITOURINARY: No dysuria, hematuria.  ENDOCRINE: No polyuria, nocturia. HEMATOLOGY: No bleeding. SKIN: No rash or lesion. MUSCULOSKELETAL: No joint pain at this time.    NEUROLOGIC: Positive for migraine headache, slurred speech, right facial numbness and right arm numbness.  No focal weakness.  PSYCHIATRY: No anxiety or depression.   MEDICATIONS AT HOME:  Prior to Admission medications   Medication Sig Start Date End Date Taking? Authorizing Provider  NON FORMULARY Place 5 drops under the tongue 2 (two) times daily. CBD OIL   Yes [provider]  acetaminophen (TYLENOL) 500 MG tablet Take 1,000 mg by mouth every 6 (six) hours as needed for mild pain or headache.    [provider]  albuterol (PROVENTIL HFA;VENTOLIN HFA) 108 (90 BASE) MCG/ACT inhaler Inhale 2 puffs into the lungs every 6 (six) hours as needed for wheezing or shortness of breath.     [provider]  aspirin EC 81 MG tablet Take 81 mg by mouth daily.    [provider]  busPIRone (BUSPAR) 10 MG tablet Take 1 tablet (10 mg total) by mouth 3 (three) times daily. Patient not taking: Reported on 11/04/2017 08/07/16   Rainey Pines, MD  dicyclomine (BENTYL) 10 MG capsule Take 10 mg by mouth. Reported on 09/09/2015 07/14/15 07/13/16  [provider]  escitalopram (LEXAPRO) 20 MG tablet Take 1 tablet (20 mg total) by mouth daily. Patient not taking: Reported on 11/04/2017 08/07/16   Rainey Pines, MD  FLOVENT HFA 110 MCG/ACT inhaler Inhale 1 puff into the lungs 2 (two) times daily. 10/18/15   [provider]  gabapentin (NEURONTIN) 300 MG capsule Take 1 capsule (300 mg total) by mouth 2 (two) times daily. Patient not taking: Reported on 11/04/2017 08/07/16   Rainey Pines, MD  meloxicam (MOBIC) 7.5 MG tablet Take 7.5-15 mg by mouth daily as needed for pain.     [provider]  Multiple Vitamin (MULTIVITAMIN) tablet Take 1 tablet by mouth daily.    [provider]  pantoprazole (PROTONIX) 40 MG tablet Take 40 mg by mouth daily. Reported on 08/03/2015    [provider]  prazosin (MINIPRESS) 1 MG capsule Take 1 capsule (1 mg total) by mouth  at bedtime. Patient not taking: Reported on 11/04/2017 08/07/16   Rainey Pines, MD  topiramate (TOPAMAX) 50 MG tablet Take 1 tablet in the morning, 2 tablets at night until 8/29. Take 2 tablets in the morning and night thereafter. Patient not taking: Reported on 11/04/2017 10/28/15   Riccardo Dubin, MD  triamcinolone (KENALOG) 0.025 % ointment Apply 1 application topically 2 (two) times daily. Patient not taking: Reported on 11/04/2017 10/26/16   Laban Emperor, PA-C      PHYSICAL EXAMINATION:   VITAL SIGNS: Blood pressure 92/63, pulse 68, temperature 98.7 F (37.1 C), temperature source Oral, resp. rate 15, height 5\' 3"  (1.6 m), weight 74.8 kg, SpO2 99 %.  GENERAL:  47 y.o.-year-old patient lying in the bed with no acute distress.  EYES: Pupils equal, round, reactive to light and accommodation. No scleral icterus. Extraocular muscles intact.  HEENT: Head atraumatic, normocephalic. Oropharynx and nasopharynx clear.  NECK:  Supple, no jugular venous distention. No thyroid enlargement, no tenderness.  LUNGS: Normal breath sounds bilaterally, no wheezing, rales,rhonchi or crepitation. No use of accessory muscles of respiration.  CARDIOVASCULAR: S1, S2 normal. No S3/S4.  ABDOMEN: Soft, nontender, nondistended. Bowel sounds present. No organomegaly or mass.  EXTREMITIES: No pedal edema, cyanosis, or clubbing.  NEUROLOGIC: Slurred speech is noted.  Cranial nerves II through XII are intact. Muscle strength 5/5 in all extremities. Sensation intact.   PSYCHIATRIC: The patient is alert and oriented x 3.  SKIN: No obvious rash, lesion, or ulcer.   LABORATORY PANEL:   CBC Recent Labs  Lab 11/04/17 1948  WBC 6.3  HGB 13.4  HCT 37.9  PLT 278  MCV 85.2  MCH 30.2  MCHC 35.4  RDW 12.7  LYMPHSABS 2.2  MONOABS 0.7  EOSABS 0.1  BASOSABS 0.0   ------------------------------------------------------------------------------------------------------------------  Chemistries  Recent Labs  Lab  11/04/17 1948  NA 142  K 3.0*  CL 109  CO2 28  GLUCOSE 112*  BUN 9  CREATININE 0.61  CALCIUM 9.0  AST 47*  ALT 36  ALKPHOS 105  BILITOT 0.4   ------------------------------------------------------------------------------------------------------------------ estimated creatinine clearance is 82.5 mL/min (by C-G formula based on SCr of 0.61 mg/dL). ------------------------------------------------------------------------------------------------------------------ No results for input(s): TSH, T4TOTAL, T3FREE, THYROIDAB in the last 72 hours.  Invalid input(s): FREET3   Coagulation profile Recent Labs  Lab 11/04/17 1948  INR 1.00   ------------------------------------------------------------------------------------------------------------------- No results for input(s): DDIMER in the last 72 hours. -------------------------------------------------------------------------------------------------------------------  Cardiac Enzymes Recent Labs  Lab 11/04/17 1948  TROPONINI <0.03   ------------------------------------------------------------------------------------------------------------------ Invalid input(s): POCBNP  ---------------------------------------------------------------------------------------------------------------  Urinalysis    Component Value Date/Time   COLORURINE YELLOW 10/28/2015 1000   APPEARANCEUR CLOUDY (A) 10/28/2015 1000   APPEARANCEUR Clear 11/08/2013 2141   LABSPEC 1.018 10/28/2015 1000   LABSPEC 1.008 11/08/2013 2141   PHURINE 6.5 10/28/2015 1000   GLUCOSEU NEGATIVE 10/28/2015 1000   GLUCOSEU Negative 11/08/2013 2141   HGBUR NEGATIVE 10/28/2015 1000   BILIRUBINUR NEGATIVE 10/28/2015 1000   BILIRUBINUR Negative 11/08/2013 2141   KETONESUR NEGATIVE 10/28/2015 1000   PROTEINUR NEGATIVE 10/28/2015 1000   NITRITE POSITIVE (A) 10/28/2015 1000   LEUKOCYTESUR MODERATE (A) 10/28/2015 1000   LEUKOCYTESUR Negative 11/08/2013 2141      RADIOLOGY: Ct Head Wo Contrast  Result Date: 11/04/2017 CLINICAL DATA:  Patient with right-sided facial and arm numbness. Headache. EXAM: CT HEAD WITHOUT CONTRAST TECHNIQUE: Contiguous axial images were obtained from the base of the skull through the vertex without intravenous contrast. COMPARISON:  Head CT 04/12/2016 FINDINGS: Brain: Ventricles and sulci are appropriate for patient's age. No evidence for acute cortically based infarct, intracranial hemorrhage, mass lesion or mass-effect. Unchanged hypodensity within the left external capsule, potentially sequelae of chronic small vessel ischemic changes. Vascular: Unremarkable Skull: Intact. Sinuses/Orbits: Paranasal sinuses are well aerated. Mastoid air cells unremarkable. Orbits are unremarkable. Other: None. IMPRESSION: No acute intracranial process. Electronically Signed   By: Lovey Newcomer M.D.   On: 11/04/2017 19:47    EKG: Orders placed or performed during the hospital encounter of 11/04/17  . EKG 12-Lead  . EKG 12-Lead  . ED EKG  . ED EKG    IMPRESSION AND PLAN:  1.  Right facial numbness and slurred speech could be related to a severe migraine episode.  Will treat migraine headache.  Will rule out stroke.  We will start patient on aspirin and check MRI of the brain, 2D echo, carotid ultrasound, vitamin B12 level.  Neurology  is consulted for further evaluation and treatment. 2.  Migraine headache, will use Toradol IV and Zofran as needed. 3.  Syncope, will rule out cardiogenic and neurogenic causes.  We will keep patient on telemetry and follow troponin levels.  Will check 2D echo and MRI of the brain. 4.  Tobacco/nicotine abuse.  She is currently using E cigarettes.  Cessation was discussed with patient in detail.  All the records are reviewed and case discussed with ED provider. Management plans discussed with the patient, who is in agreement.  CODE STATUS: Full Code Status History    Date Active Date Inactive Code Status  Order ID Comments User Context   10/26/2015 1703 10/28/2015 1950 Full Code [id number]  Riccardo Dubin, MD ED   06/22/2015 1814 06/27/2015 1810 Full Code [id number]  Max Sane, MD Inpatient   05/10/2015 2302 05/12/2015 1445 Full Code [id number]  Vaughan Basta, MD Inpatient   11/03/2014 2320 11/04/2014 1918 Full Code [id number]  Hower, Aaron Mose, MD ED       TOTAL TIME TAKING CARE OF THIS PATIENT: 45 minutes.    Amelia Jo M.D on 11/04/2017 at 10:50 PM  Between 7am to 6pm - Pager - [phone]  After 6pm go to [email or web address] - password [email or web address] [facility] [place] [facility]  [phone]  CC: Primary care physician; Glendon Axe, MD

## 2017-11-04 NOTE — ED Notes (Signed)
Patient transported to MRI 

## 2017-11-04 NOTE — ED Provider Notes (Addendum)
[facility] Emergency Department Provider Note  ____________________________________________  Time seen: Approximately 8:25 PM  I have reviewed the triage vital signs and the nursing notes.   HISTORY  Chief Complaint Numbness and Headache   HPI [name] is a 47 y.o. female with a history of stroke at the age of 30 due to smoking and birth control, migraine headaches, seizure disorder who presents for evaluation of right-sided numbness.  Patient reports that her symptoms started yesterday.  She is complaining of right-sided facial and arm numbness but no weakness.  She also has had a left-sided headache that she describes as sharp, moderate, constant and nonradiating.  She does have a history of migraine headaches which is similar to this 1 however she has never had neurological deficits with her migraine headaches.  Patient also reports a syncopal event yesterday.  She reports that her husband told her she had a syncopal event in the kitchen.  Patient only remembers waking up on the floor of the kitchen.  She does not remember if the syncope happened before or after the neurological deficits.  She denies neck pain or back pain.  She denies dizziness, changes in vision.  She also has had slurred speech since yesterday.  She continues to smoke.  She is not on any hormones.  Past Medical History:  Diagnosis Date  . Anxiety   . Arthritis   . Asthma   . Bilateral shoulder pain   . Chronic neck pain   . Depression   . GERD (gastroesophageal reflux disease)   . Migraines   . Nasal septal perforation   . Seizure ([place])   . Sleep apnea   . Stroke [facility])     Patient Active Problem List   Diagnosis Date Noted  . RAD (reactive airway disease), moderate persistent, uncomplicated 20/25/4270  . Acute cystitis without hematuria   . Change in mental status 10/26/2015  . Nonepileptic episode ([place]) 10/26/2015  . Adult BMI 30+ 10/18/2015  . Atopic rhinitis  10/18/2015  . Reactive airway disease without complication 62/37/6283  . Recurrent major depressive disorder, in partial remission ([place]) 10/18/2015  . Snoring 10/18/2015  . Other epilepsy, intractable, without status epilepticus ([place]) 10/04/2015  . Spells of speech arrest 08/10/2015  . Shaking spells 08/10/2015  . Apneic spell 08/10/2015  . Primary osteoarthritis involving multiple joints 07/25/2015  . Hot flashes 07/25/2015  . Chronic pain of both shoulders 06/26/2015  . Spondylosis of cervical region without myelopathy or radiculopathy 06/26/2015  . Pseudoseizure   . Status epilepticus due to refractory epilepsy ([place]) 06/22/2015  . Chronic nonintractable headache 06/04/2015  . Blurry vision, right eye 05/18/2015  . Depression, major, single episode, complete remission ([place]) 05/18/2015  . Renal calculus 05/18/2015  . Pseudoseizures 05/11/2015  . Migraines 05/11/2015  . Abdominal pain 05/10/2015  . Allergy to bee sting 12/01/2014  . Pain in toe of left foot 12/01/2014  . Nausea 12/01/2014  . Gastroesophageal reflux disease without esophagitis 12/01/2014  . Difficulty breathing 10/19/2014  . Seizures ([place]) 05/17/2014  . Respiratory failure, acute ([place]) 05/16/2014  . Depression, unspecified depression type 05/14/2014  . Headache disorder 04/13/2014  . Spells 04/13/2014  . Anxiety 01/07/2014  . Depression 01/07/2014  . Acute headache 12/02/2013  . Rotator cuff (capsule) sprain and strain 11/27/2013  . Midline low back pain without sciatica 10/29/2013  . Localized swelling, mass and lump, neck 10/29/2013  . Seizure disorder ([place]) 10/29/2013  . COPD (chronic obstructive pulmonary disease) ([place])   .  Emphysema/COPD ([place])   . Sleep apnea   . Stroke ([place])   . Pain, chronic 07/11/2013    Past Surgical History:  Procedure Laterality Date  . ABDOMINAL HYSTERECTOMY     partial  . CESAREAN SECTION  1993  . PARTIAL HYSTERECTOMY  1995  . TONSILLECTOMY      Prior to Admission  medications   Medication Sig Start Date End Date Taking? Authorizing Provider  acetaminophen (TYLENOL) 500 MG tablet Take 1,000 mg by mouth every 6 (six) hours as needed for mild pain or headache.    [provider]  albuterol (PROVENTIL HFA;VENTOLIN HFA) 108 (90 BASE) MCG/ACT inhaler Inhale 2 puffs into the lungs every 6 (six) hours as needed for wheezing or shortness of breath.     [provider]  aspirin EC 81 MG tablet Take 81 mg by mouth daily.    [provider]  busPIRone (BUSPAR) 10 MG tablet Take 1 tablet (10 mg total) by mouth 3 (three) times daily. 08/07/16   Rainey Pines, MD  dicyclomine (BENTYL) 10 MG capsule Take 10 mg by mouth. Reported on 09/09/2015 07/14/15 07/13/16  [provider]  diphenhydrAMINE (BENADRYL) 25 mg capsule Take 1 capsule (25 mg total) by mouth every 4 (four) hours as needed. 10/26/16 10/26/17  Laban Emperor, PA-C  escitalopram (LEXAPRO) 20 MG tablet Take 1 tablet (20 mg total) by mouth daily. 08/07/16   Rainey Pines, MD  FLOVENT HFA 110 MCG/ACT inhaler Inhale 1 puff into the lungs 2 (two) times daily. 10/18/15   [provider]  gabapentin (NEURONTIN) 300 MG capsule Take 1 capsule (300 mg total) by mouth 2 (two) times daily. 08/07/16   Rainey Pines, MD  meloxicam (MOBIC) 7.5 MG tablet Take 7.5-15 mg by mouth daily as needed for pain.     [provider]  Multiple Vitamin (MULTIVITAMIN) tablet Take 1 tablet by mouth daily.    [provider]  pantoprazole (PROTONIX) 40 MG tablet Take 40 mg by mouth daily. Reported on 08/03/2015    [provider]  prazosin (MINIPRESS) 1 MG capsule Take 1 capsule (1 mg total) by mouth at bedtime. 08/07/16   Rainey Pines, MD  topiramate (TOPAMAX) 50 MG tablet Take 1 tablet in the morning, 2 tablets at night until 8/29. Take 2 tablets in the morning and night thereafter. 10/28/15   Riccardo Dubin, MD  triamcinolone (KENALOG) 0.025 % ointment Apply 1 application topically 2 (two)  times daily. 10/26/16   Laban Emperor, PA-C    Allergies Bee venom; Shellfish allergy; Contrast media [iodinated diagnostic agents]; Other; Penicillins; and Sulfa antibiotics  Family History  Problem Relation Age of Onset  . Depression Maternal Grandmother   . Hypertension Maternal Grandmother   . Cancer Maternal Grandfather   . Lung cancer Mother   . Depression Mother   . Learning disabilities Sister   . Depression Sister   . Depression Paternal Uncle   . Hypertension Paternal Uncle   . Diabetes Neg Hx   . Breast cancer Neg Hx     Social History Social History   Tobacco Use  . Smoking status: Current Every Day Smoker    Types: E-cigarettes  . Smokeless tobacco: Never Used  Substance Use Topics  . Alcohol use: No  . Drug use: No    Review of Systems  Constitutional: Negative for fever. + syncope Eyes: Negative for visual changes. ENT: Negative for sore throat. Neck: No neck pain  Cardiovascular: Negative for chest pain. Respiratory: Negative  for shortness of breath. Gastrointestinal: Negative for abdominal pain, vomiting or diarrhea. Genitourinary: Negative for dysuria. Musculoskeletal: Negative for back pain. Skin: Negative for rash. Neurological: + headaches, slurred speech, R sided numbness. Psych: No SI or HI  ____________________________________________   PHYSICAL EXAM:  VITAL SIGNS: ED Triage Vitals [11/04/17 1916]  Enc Vitals Group     BP 132/74     Pulse Rate 87     Resp 18     Temp 98.7 F (37.1 C)     Temp Source Oral     SpO2 99 %     Weight 165 lb (74.8 kg)     Height 5\' 3"  (1.6 m)     Head Circumference      Peak Flow      Pain Score 7     Pain Loc      Pain Edu?      Excl. in [place]?     Constitutional: Alert and oriented, no apparent distress. HEENT:      Head: Normocephalic and atraumatic.         Eyes: Conjunctivae are normal. Sclera is non-icteric.       Mouth/Throat: Mucous membranes are moist.       Neck: Supple with no  signs of meningismus. Cardiovascular: Regular rate and rhythm. No murmurs, gallops, or rubs. 2+ symmetrical distal pulses are present in all extremities. No JVD. Respiratory: Normal respiratory effort. Lungs are clear to auscultation bilaterally. No wheezes, crackles, or rhonchi.  Gastrointestinal: Soft, non tender, and non distended with positive bowel sounds. No rebound or guarding. Musculoskeletal: Nontender with normal range of motion in all extremities. No edema, cyanosis, or erythema of extremities. Neurologic: Slurred speech, A & O x3, PERRL, EOMI, no nystagmus, CN II-XII intact, motor testing reveals good tone and bulk throughout. RUE pronator drift, no dysmetria. Muscle strength is 5/5 throughout. Deep tendon reflexes are 2+ throughout with downgoing toes. Sensory examination is intact. Gait deferred Skin: Skin is warm, dry and intact. No rash noted. Psychiatric: Mood and affect are normal. Speech and behavior are normal.  ____________________________________________   LABS (all labs ordered are listed, but only abnormal results are displayed)  Labs Reviewed  COMPREHENSIVE METABOLIC PANEL - Abnormal; Notable for the following components:      Result Value   Potassium 3.0 (*)    Glucose, Bld 112 (*)    AST 47 (*)    All other components within normal limits  PROTIME-INR  APTT  CBC  DIFFERENTIAL  TROPONIN I  CBG MONITORING, ED  POC URINE PREG, ED   ____________________________________________  EKG  ED ECG REPORT I, Rudene Re, the attending physician, personally viewed and interpreted this ECG.  Normal sinus rhythm, normal intervals, normal axis, no STE or depressions, no evidence of HOCM, AV block, delta wave, ARVD, prolonged QTc, WPW, or [place].   ____________________________________________  RADIOLOGY  I have personally reviewed the images performed during this visit and I agree with the Radiologist's read.   Interpretation by Radiologist:  Ct Head Wo  Contrast  Result Date: 11/04/2017 CLINICAL DATA:  Patient with right-sided facial and arm numbness. Headache. EXAM: CT HEAD WITHOUT CONTRAST TECHNIQUE: Contiguous axial images were obtained from the base of the skull through the vertex without intravenous contrast. COMPARISON:  Head CT 04/12/2016 FINDINGS: Brain: Ventricles and sulci are appropriate for patient's age. No evidence for acute cortically based infarct, intracranial hemorrhage, mass lesion or mass-effect. Unchanged hypodensity within the left external capsule, potentially sequelae of chronic small vessel  ischemic changes. Vascular: Unremarkable Skull: Intact. Sinuses/Orbits: Paranasal sinuses are well aerated. Mastoid air cells unremarkable. Orbits are unremarkable. Other: None. IMPRESSION: No acute intracranial process. Electronically Signed   By: Lovey Newcomer M.D.   On: 11/04/2017 19:47     ____________________________________________   PROCEDURES  Procedure(s) performed: None Procedures Critical Care performed:  None ____________________________________________   INITIAL IMPRESSION / ASSESSMENT AND PLAN / ED COURSE  47 y.o. female with a history of stroke at the age of 1 due to smoking and birth control, migraine headaches, seizure disorder who presents for evaluation of right-sided numbness.  Patient has slurred speech and right-sided pronator drift, no other acute neurological findings.  Differential diagnoses including CVA versus complex migraine.  CT head showing no acute findings.  EKG showing no evidence of dysrhythmias or ischemia. Will treat HA with migraine cocktail and admit to Hospitalist for further evaluation.       As part of my medical decision making, I reviewed the following data within the [place] notes reviewed and incorporated, Labs reviewed , EKG interpreted , Old chart reviewed, Radiograph reviewed , Discussed with admitting physician , Notes from prior ED visits and [place] Controlled  Substance Database    Pertinent labs & imaging results that were available during my care of the patient were reviewed by me and considered in my medical decision making (see chart for details).    ____________________________________________   FINAL CLINICAL IMPRESSION(S) / ED DIAGNOSES  Final diagnoses:  Cerebrovascular accident (CVA), unspecified mechanism ([place])  Syncope, unspecified syncope type      NEW MEDICATIONS STARTED DURING THIS VISIT:  ED Discharge Orders    None       Note:  This document was prepared using Dragon voice recognition software and may include unintentional dictation errors.    Rudene Re, MD 11/04/17 2029    Rudene Re, MD 11/04/17 2031

## 2017-11-04 NOTE — ED Triage Notes (Signed)
Patient with complaint of right side facial and arm numbness and right leg weakness that started yesterday. Patient states that she also has a headache. Patient with some expressive asphasia. Patient with right arm drift.

## 2017-11-04 NOTE — ED Notes (Signed)
Patient transported to CT 

## 2017-11-05 ENCOUNTER — Other Ambulatory Visit: Payer: Self-pay

## 2017-11-05 ENCOUNTER — Inpatient Hospital Stay: Payer: Medicare HMO

## 2017-11-05 ENCOUNTER — Inpatient Hospital Stay
Admit: 2017-11-05 | Discharge: 2017-11-05 | Disposition: A | Discharge status: Home / Self Care | Payer: Medicare HMO | Attending: Internal Medicine | Admitting: Internal Medicine

## 2017-11-05 DIAGNOSIS — G43909 Migraine, unspecified, not intractable, without status migrainosus: Secondary | ICD-10-CM | POA: Diagnosis present

## 2017-11-05 DIAGNOSIS — G43409 Hemiplegic migraine, not intractable, without status migrainosus: Secondary | ICD-10-CM

## 2017-11-05 LAB — HEMOGLOBIN A1C
Hgb A1c MFr Bld: 5.1 % (ref 4.8–5.6)
Mean Plasma Glucose: 99.67 mg/dL

## 2017-11-05 LAB — LIPID PANEL
CHOLESTEROL: 166 mg/dL (ref 0–200)
HDL: 33 mg/dL — ABNORMAL LOW (ref >40)
LDL CALC: 87 mg/dL (ref 0–99)
Total CHOL/HDL Ratio: 5 RATIO
Triglycerides: 231 mg/dL — ABNORMAL HIGH (ref <150)
VLDL: 46 mg/dL — AB (ref 0–40)

## 2017-11-05 LAB — POTASSIUM: Potassium: 4 mmol/L (ref 3.5–5.1)

## 2017-11-05 LAB — ECHOCARDIOGRAM COMPLETE
HEIGHTINCHES: 63 in
WEIGHTICAEL: 2624.36 [oz_av]

## 2017-11-05 LAB — MAGNESIUM: Magnesium: 2 mg/dL (ref 1.7–2.4)

## 2017-11-05 LAB — VITAMIN B12: VITAMIN B 12: 265 pg/mL (ref 180–914)

## 2017-11-05 MED ORDER — ADULT MULTIVITAMIN W/MINERALS CH
1.0000 | ORAL_TABLET | Freq: Every day | ORAL | Status: DC
  Administered 2017-11-05: 1 via ORAL
  Filled 2017-11-05: qty 1

## 2017-11-05 MED ORDER — BUTALBITAL-APAP-CAFFEINE 50-325-40 MG PO TABS
1.0000 | ORAL_TABLET | Freq: Four times a day (QID) | ORAL | Status: DC | PRN
  Administered 2017-11-05: 13:00:00 1 via ORAL
  Filled 2017-11-05 (×3): qty 1

## 2017-11-05 MED ORDER — KETOROLAC TROMETHAMINE 30 MG/ML IJ SOLN
30.0000 mg | Freq: Four times a day (QID) | INTRAMUSCULAR | Status: DC | PRN
  Administered 2017-11-05 (×3): 30 mg via INTRAVENOUS
  Filled 2017-11-05 (×3): qty 1

## 2017-11-05 MED ORDER — HEPARIN SODIUM (PORCINE) 5000 UNIT/ML IJ SOLN
5000.0000 [IU] | Freq: Three times a day (TID) | INTRAMUSCULAR | Status: DC
  Administered 2017-11-05: 06:00:00 5000 [IU] via SUBCUTANEOUS
  Filled 2017-11-05: qty 1

## 2017-11-05 MED ORDER — MAGNESIUM 500 MG PO CAPS: 500.0000 mg | ORAL_CAPSULE | Freq: Every day | ORAL | 0 refills | Status: DC

## 2017-11-05 MED ORDER — ASPIRIN EC 81 MG PO TBEC
81.0000 mg | DELAYED_RELEASE_TABLET | Freq: Every day | ORAL | Status: DC
  Administered 2017-11-05: 11:00:00 81 mg via ORAL
  Filled 2017-11-05: qty 1

## 2017-11-05 MED ORDER — DEXAMETHASONE SODIUM PHOSPHATE 10 MG/ML IJ SOLN
10.0000 mg | Freq: Once | INTRAMUSCULAR | Status: AC
  Administered 2017-11-05: 10 mg via INTRAVENOUS
  Filled 2017-11-05: qty 1

## 2017-11-05 MED ORDER — ALBUTEROL SULFATE (2.5 MG/3ML) 0.083% IN NEBU: 3.0000 mL | INHALATION_SOLUTION | Freq: Four times a day (QID) | RESPIRATORY_TRACT | Status: DC | PRN

## 2017-11-05 MED ORDER — ONDANSETRON HCL 4 MG/2ML IJ SOLN
4.0000 mg | Freq: Four times a day (QID) | INTRAMUSCULAR | Status: DC | PRN
  Administered 2017-11-05 (×3): 4 mg via INTRAVENOUS
  Filled 2017-11-05 (×3): qty 2

## 2017-11-05 MED ORDER — B COMPLEX VITAMINS PO CAPS: 1.0000 | ORAL_CAPSULE | Freq: Every day | ORAL | 0 refills | Status: AC

## 2017-11-05 MED ORDER — SODIUM CHLORIDE 0.9 % IV SOLN
INTRAVENOUS | Status: DC
  Administered 2017-11-05: 01:00:00 via INTRAVENOUS

## 2017-11-05 MED ORDER — BUDESONIDE 0.25 MG/2ML IN SUSP
2.0000 mL | Freq: Two times a day (BID) | RESPIRATORY_TRACT | Status: DC
  Filled 2017-11-05: qty 2

## 2017-11-05 MED ORDER — DICYCLOMINE HCL 10 MG PO CAPS
10.0000 mg | ORAL_CAPSULE | Freq: Two times a day (BID) | ORAL | Status: DC
  Filled 2017-11-05: qty 1

## 2017-11-05 MED ORDER — POTASSIUM CHLORIDE 20 MEQ PO PACK
40.0000 meq | PACK | Freq: Once | ORAL | Status: AC
  Administered 2017-11-05: 40 meq via ORAL
  Filled 2017-11-05: qty 2

## 2017-11-05 MED ORDER — STROKE: EARLY STAGES OF RECOVERY BOOK
Freq: Once | Status: AC
  Administered 2017-11-05: 01:00:00

## 2017-11-05 NOTE — Progress Notes (Signed)
SLP Cancellation Note  Patient Details Name: [name] MRN: [id number] DOB: [birth year]   Cancelled treatment:       Reason Eval/Treat Not Completed: SLP screened, no needs identified, will sign off(chart reviewed; consulted NSG then met w/ pt in room). Pt denied any difficulty swallowing and is currently on a regular diet; tolerates swallowing pills w/ water per NSG. Pt conversed at conversational level w/out deficits noted; pt denied any language deficits and stated she was "talking easily" w/ others but felt her speech was min "slow". This was not noted once pt began talking w/ SLP further and when ordering her lunch meal on the phone. When asked if she wanted to f/u w/ Outpt ST services for therapy, she stated she thought "this would be much better in a couple of days" and did not want to schedule any appointments at this time. Gave pt ST office contact information/card in case she wants to f/u later; encouraged her to take her time and enunciate, get min louder also, for speech strategies over the next 2-3 days then call PCP if further needs. Pt agreed.  No further skilled ST services indicated as pt appears at her baseline. Pt agreed. NSG to reconsult if any change in status while admitted.    Orinda Kenner, MS, CCC-SLP Watson,Katherine 11/05/2017, 12:39 PM

## 2017-11-05 NOTE — Progress Notes (Signed)
Physical Therapy Evaluation Patient Details Name: [name] MRN: [id number] DOB: 01/21/1971 Today's Date: 11/05/2017   History of Present Illness  Jeanita Carneiro  is a 47 y.o. female with a known history of remote stroke at age 47, without residual symptoms, tobacco/nicotine abuse, migraine headaches, seizures. Patient was brought to the hospital for acute onset of slurred speech, right facial and right arm numbness.  Patient also complains of severe migraine headache, affecting mainly the left side of the head, started even before the slurred speech and numbness.  No vision changes, no focal weakness.  She denies using alcohol or illicit drugs. During ED evaluation in the emergency room, patient felt better status post pain medication.  Her headache was almost completely resolved and her arm numbness completely resolved.  However, her speech was still slurred and she still complained of right facial numbness. Blood test done in the emergency room, including CBC and CMP are grossly unremarkable, except for low potassium at 3. CT of the head is negative for acute intracranial abnormalities. MRI is also negative for acute changes. Patient is now admitted for further evaluation and treatment. At time of PT evaluation pt is still complaining of some mild slurred speech as well as R facial and R arm numbness. She also has a headache with nausea and RN notified of patient request for medication.   Clinical Impression  Pt is independent with bed mobility and transfers. Pt is able to complete a full lap around RN station without assistive device. Mild slowing and instability with head turns but able to self-correct. No dizziness reported and pt denies DOE. [facility] for limited community mobility. Able to perform tandem and single leg balance for greater than 10 seconds. Positive Romberg for increased sway but no full LOB. Pt with some mild R shoulder and R grip weakness as well as decreased sensation to  light touch along R side of face and RUE. No mobility deficits identified. No further PT needs. Order will be completed. Please enter new order if status or needs change.     Follow Up Recommendations No PT follow up    Equipment Recommendations  None recommended by PT    Recommendations for Other Services       Precautions / Restrictions Precautions Precautions: Fall Restrictions Weight Bearing Restrictions: No      Mobility  Bed Mobility Overal bed mobility: Independent             General bed mobility comments: Good speed/seqewuencing  Transfers Overall transfer level: Independent Equipment used: None             General transfer comment: Good speed, sequencing, and stability. No deficits  Ambulation/Gait Ambulation/Gait assistance: Supervision Gait Distance (Feet): 200 Feet Assistive device: None       General Gait Details: Pt is able to complete a full lap around RN station without assistive device. Mild slowing and instability with head turns but able to self-correct. No dizziness reported and pt denies DOE. [facility] WFL for limited community mobility  Stairs            Wheelchair Mobility    Modified Rankin (Stroke Patients Only)       Balance Overall balance assessment: Needs assistance Sitting-balance support: No upper extremity supported Sitting balance-Leahy Scale: Good     Standing balance support: No upper extremity supported Standing balance-Leahy Scale: Good Standing balance comment: Able to perform tandem and single leg balance for greater than 10 seconds. Positive Romberg for increased sway.  Pertinent Vitals/Pain Pain Assessment: 0-10 Pain Score: 8  Pain Location: Headache Pain Descriptors / Indicators: Headache Pain Intervention(s): Patient requesting pain meds-RN notified    Home Living Family/patient expects to be discharged to:: Private residence Living Arrangements:  Spouse/significant other Available Help at Discharge: Family Type of Home: House Home Access: Stairs to enter Entrance Stairs-Rails: Can reach both Entrance Stairs-Number of Steps: 5 Home Layout: One level Home Equipment: Environmental consultant - 2 wheels;Walker - 4 wheels;Cane - single point;Shower seat;Grab bars - tub/shower      Prior Function Level of Independence: Independent         Comments: Pt reports indepedent ambulation without assistive device. Does not drive secondary to seizures. Pt reports frequent falls secondary to syncope. Denies any mechanical falls or imbalance     Hand Dominance   Dominant Hand: Right    Extremity/Trunk Assessment   Upper Extremity Assessment Upper Extremity Assessment: RUE deficits/detail RUE Deficits / Details: Mild weakness in R shoulder, 4-/5, and R grip when compared to left side. Pt reports R face and R arm numbness    Lower Extremity Assessment Lower Extremity Assessment: Overall WFL for tasks assessed       Communication   Communication: No difficulties  Cognition Arousal/Alertness: Awake/alert Behavior During Therapy: WFL for tasks assessed/performed Overall Cognitive Status: Within Functional Limits for tasks assessed                                        General Comments      Exercises     Assessment/Plan    PT Assessment Patent does not need any further PT services  PT Problem List         PT Treatment Interventions      PT Goals (Current goals can be found in the Care Plan section)  Acute Rehab PT Goals PT Goal Formulation: All assessment and education complete, DC therapy    Frequency     Barriers to discharge        Co-evaluation               AM-PAC PT "6 Clicks" Daily Activity  Outcome Measure Difficulty turning over in bed (including adjusting bedclothes, sheets and blankets)?: None Difficulty moving from lying on back to sitting on the side of the bed? : None Difficulty sitting  down on and standing up from a chair with arms (e.g., wheelchair, bedside commode, etc,.)?: None Help needed moving to and from a bed to chair (including a wheelchair)?: None Help needed walking in hospital room?: None Help needed climbing 3-5 steps with a railing? : None 6 Click Score: 24    End of Session Equipment Utilized During Treatment: Gait belt Activity Tolerance: Patient tolerated treatment well Patient left: in bed;with call bell/phone within reach;with bed alarm set Nurse Communication: Patient requests pain meds PT Visit Diagnosis: Muscle weakness (generalized) (M62.81)    Time: 2620-3559 PT Time Calculation (min) (ACUTE ONLY): 13 min   Charges:   PT Evaluation $PT Eval Low Complexity: 1 Low          Cindel Daugherty D Ameliya Nicotra PT, DPT, GCS   Garret Teale 11/05/2017, 10:01 AM

## 2017-11-05 NOTE — Consult Note (Signed)
Reason for Consult: numbness  Referring Physician: Dr. Darvin Neighbours  CC: numbness/headache   HPI: [name] is an 47 y.o. female with a known history of remote stroke at age 35, without residual symptoms, tobacco/nicotine abuse, migraine headaches, seizures. Patient was brought to the hospital for acute onset of slurred speech, right facial and right arm numbness which all have improved. Currently complaining of R sided pressure type of headache.  MRI no acute abnormalities.   Past Medical History:  Diagnosis Date  . Anxiety   . Arthritis   . Asthma   . Bilateral shoulder pain   . Chronic neck pain   . Depression   . GERD (gastroesophageal reflux disease)   . Migraines   . Nasal septal perforation   . Seizure ([place])   . Sleep apnea   . Stroke [facility])     Past Surgical History:  Procedure Laterality Date  . ABDOMINAL HYSTERECTOMY     partial  . CESAREAN SECTION  1993  . PARTIAL HYSTERECTOMY  1995  . TONSILLECTOMY      Family History  Problem Relation Age of Onset  . Depression Maternal Grandmother   . Hypertension Maternal Grandmother   . Cancer Maternal Grandfather   . Lung cancer Mother   . Depression Mother   . Learning disabilities Sister   . Depression Sister   . Depression Paternal Uncle   . Hypertension Paternal Uncle   . Diabetes Neg Hx   . Breast cancer Neg Hx     Social History:  reports that she has been smoking e-cigarettes. She has never used smokeless tobacco. She reports that she does not drink alcohol or use drugs.  Allergies  Allergen Reactions  . Bee Venom Anaphylaxis  . Shellfish Allergy Anaphylaxis  . Contrast Media [Iodinated Diagnostic Agents] Other (See Comments)    Unknown  Reaction:  Unknown   . Other Other (See Comments)    Unknown   . Penicillins Other (See Comments)    Reaction:  Seizures  Has patient had a PCN reaction causing immediate rash, facial/tongue/throat swelling, SOB or lightheadedness with hypotension: No Has patient  had a PCN reaction causing severe rash involving mucus membranes or skin necrosis: No Has patient had a PCN reaction that required hospitalization Yes Has patient had a PCN reaction occurring within the last 10 years: Yes If all of the above answers are "NO", then may proceed with Cephalosporin use.  . Sulfa Antibiotics Itching    Medications: I have reviewed the patient's current medications.  ROS: History obtained from the patient  General ROS: negative for - chills, fatigue, fever, night sweats, weight gain or weight loss Psychological ROS: negative for - behavioral disorder, hallucinations, memory difficulties, mood swings or suicidal ideation Ophthalmic ROS: negative for - blurry vision, double vision, eye pain or loss of vision ENT ROS: negative for - epistaxis, nasal discharge, oral lesions, sore throat, tinnitus or vertigo Allergy and Immunology ROS: negative for - hives or itchy/watery eyes Hematological and Lymphatic ROS: negative for - bleeding problems, bruising or swollen lymph nodes Endocrine ROS: negative for - galactorrhea, hair pattern changes, polydipsia/polyuria or temperature intolerance Respiratory ROS: negative for - cough, hemoptysis, shortness of breath or wheezing Cardiovascular ROS: negative for - chest pain, dyspnea on exertion, edema or irregular heartbeat Gastrointestinal ROS: negative for - abdominal pain, diarrhea, hematemesis, nausea/vomiting or stool incontinence Genito-Urinary ROS: negative for - dysuria, hematuria, incontinence or urinary frequency/urgency Musculoskeletal ROS: negative for - joint swelling or muscular weakness Neurological ROS: as  noted in HPI Dermatological ROS: negative for rash and skin lesion changes  Physical Examination: Blood pressure 102/71, pulse 72, temperature 98.6 F (37 C), temperature source Oral, resp. rate 18, height 5\' 3"  (1.6 m), weight 74.4 kg, SpO2 100 %.    Neurological Examination   Mental Status: Alert,  oriented, thought content appropriate.  Speech fluent without evidence of aphasia.  Able to follow 3 step commands without difficulty. Cranial Nerves: II: Discs flat bilaterally; Visual fields grossly normal, pupils equal, round, reactive to light and accommodation III,IV, VI: ptosis not present, extra-ocular motions intact bilaterally V,VII: smile symmetric, facial light touch sensation normal bilaterally VIII: hearing normal bilaterally IX,X: gag reflex present XI: bilateral shoulder shrug XII: midline tongue extension Motor: Generalized weakness 4+/5 b/l Tone and bulk:normal tone throughout; no atrophy noted Sensory: Pinprick and light touch intact throughout, bilaterally Deep Tendon Reflexes: 2+ and symmetric throughout Plantars: Right: downgoing   Left: downgoing Cerebellar: normal finger-to-nose, normal rapid alternating movements and normal heel-to-shin test Gait: no tested       Laboratory Studies:   Basic Metabolic Panel: Recent Labs  Lab 11/04/17 1948 11/05/17 0801  NA 142  --   K 3.0* 4.0  CL 109  --   CO2 28  --   GLUCOSE 112*  --   BUN 9  --   CREATININE 0.61  --   CALCIUM 9.0  --   MG 2.0  --     Liver Function Tests: Recent Labs  Lab 11/04/17 1948  AST 47*  ALT 36  ALKPHOS 105  BILITOT 0.4  PROT 6.7  ALBUMIN 4.0   No results for input(s): LIPASE, AMYLASE in the last 168 hours. No results for input(s): AMMONIA in the last 168 hours.  CBC: Recent Labs  Lab 11/04/17 1948  WBC 6.3  NEUTROABS 3.2  HGB 13.4  HCT 37.9  MCV 85.2  PLT 278    Cardiac Enzymes: Recent Labs  Lab 11/04/17 1948  TROPONINI <0.03    BNP: Invalid input(s): POCBNP  CBG: No results for input(s): GLUCAP in the last 168 hours.  Microbiology: Results for orders placed or performed during the hospital encounter of 06/22/15  MRSA PCR Screening     Status: None   Collection Time: 06/22/15 11:04 PM  Result Value Ref Range Status   MRSA by PCR NEGATIVE NEGATIVE  Final    Comment:        The GeneXpert MRSA Assay (FDA approved for NASAL specimens only), is one component of a comprehensive MRSA colonization surveillance program. It is not intended to diagnose MRSA infection nor to guide or monitor treatment for MRSA infections.     Coagulation Studies: Recent Labs    11/04/17 1948  LABPROT 13.1  INR 1.00    Urinalysis: No results for input(s): COLORURINE, LABSPEC, PHURINE, GLUCOSEU, HGBUR, BILIRUBINUR, KETONESUR, PROTEINUR, UROBILINOGEN, NITRITE, LEUKOCYTESUR in the last 168 hours.  Invalid input(s): APPERANCEUR  Lipid Panel:     Component Value Date/Time   CHOL 166 11/05/2017 0801   TRIG 231 (H) 11/05/2017 0801   HDL 33 (L) 11/05/2017 0801   CHOLHDL 5.0 11/05/2017 0801   VLDL 46 (H) 11/05/2017 0801   LDLCALC 87 11/05/2017 0801    HgbA1C: No results found for: HGBA1C  Urine Drug Screen:      Component Value Date/Time   LABOPIA POSITIVE (A) 10/26/2015 1250   COCAINSCRNUR NONE DETECTED 10/26/2015 1250   COCAINSCRNUR NONE DETECTED 06/22/2015 1313   LABBENZ POSITIVE (A) 10/26/2015 1250  AMPHETMU NONE DETECTED 10/26/2015 1250   THCU NONE DETECTED 10/26/2015 1250   LABBARB NONE DETECTED 10/26/2015 1250    Alcohol Level: No results for input(s): ETH in the last 168 hours.  Other results: EKG: normal EKG, normal sinus rhythm, unchanged from previous tracings.  Imaging: Ct Head Wo Contrast  Result Date: 11/04/2017 CLINICAL DATA:  Patient with right-sided facial and arm numbness. Headache. EXAM: CT HEAD WITHOUT CONTRAST TECHNIQUE: Contiguous axial images were obtained from the base of the skull through the vertex without intravenous contrast. COMPARISON:  Head CT 04/12/2016 FINDINGS: Brain: Ventricles and sulci are appropriate for patient's age. No evidence for acute cortically based infarct, intracranial hemorrhage, mass lesion or mass-effect. Unchanged hypodensity within the left external capsule, potentially sequelae of  chronic small vessel ischemic changes. Vascular: Unremarkable Skull: Intact. Sinuses/Orbits: Paranasal sinuses are well aerated. Mastoid air cells unremarkable. Orbits are unremarkable. Other: None. IMPRESSION: No acute intracranial process. Electronically Signed   By: Lovey Newcomer M.D.   On: 11/04/2017 19:47   Mr Brain Wo Contrast  Result Date: 11/05/2017 CLINICAL DATA:  RIGHT facial and RIGHT arm numbness for a few days, slight RIGHT facial droop. History of migraines, seizure and stroke. EXAM: MRI HEAD WITHOUT CONTRAST TECHNIQUE: Multiplanar, multiecho pulse sequences of the brain and surrounding structures were obtained without intravenous contrast. COMPARISON:  CT HEAD November 04, 2017 FINDINGS: INTRACRANIAL CONTENTS: No reduced diffusion to suggest acute ischemia or hyperacute demyelination. No susceptibility artifact to suggest hemorrhage. The ventricles and sulci are normal for patient's age. A few scattered subcentimeter supratentorial white matter FLAIR T2 hyperintensities predominately in peripheral distribution. No suspicious parenchymal signal, masses, mass effect. No abnormal extra-axial fluid collections. No extra-axial masses. VASCULAR: Normal major intracranial vascular flow voids present at skull base. SKULL AND UPPER CERVICAL SPINE: No abnormal sellar expansion. No suspicious calvarial bone marrow signal. Craniocervical junction maintained. SINUSES/ORBITS: The mastoid air-cells and included paranasal sinuses are well-aerated.The included ocular globes and orbital contents are non-suspicious. OTHER: Patient is edentulous. IMPRESSION: 1. No acute intracranial process. 2. Mild chronic small vessel ischemic changes or possible migrainous related. Atypical distribution for demyelination. Electronically Signed   By: Elon Alas M.D.   On: 11/05/2017 00:21   US Carotid Bilateral (at [facility] Only)  Result Date: 11/05/2017 CLINICAL DATA:  47 year old female with stroke-like symptoms EXAM:  BILATERAL CAROTID DUPLEX ULTRASOUND TECHNIQUE: Pearline Cables scale imaging, color Doppler and duplex ultrasound were performed of bilateral carotid and vertebral arteries in the neck. COMPARISON:  Brain MRI 11/04/2017 FINDINGS: Criteria: Quantification of carotid stenosis is based on velocity parameters that correlate the residual internal carotid diameter with NASCET-based stenosis levels, using the diameter of the distal internal carotid lumen as the denominator for stenosis measurement. The following velocity measurements were obtained: RIGHT ICA: 117/50 cm/sec CCA: 93/81 cm/sec SYSTOLIC ICA/CCA RATIO:  1.4 ECA:  80 cm/sec LEFT ICA: 108/38 cm/sec CCA: 01/75 cm/sec SYSTOLIC ICA/CCA RATIO:  1.3 ECA:  79 cm/sec RIGHT CAROTID ARTERY: No significant atherosclerotic plaque or evidence of stenosis. RIGHT VERTEBRAL ARTERY:  Patent with normal antegrade flow. LEFT CAROTID ARTERY: No significant atherosclerotic plaque or evidence of stenosis. LEFT VERTEBRAL ARTERY:  Patent with normal antegrade flow. IMPRESSION: Negative bilateral carotid duplex ultrasound. Electronically Signed   By: Jacqulynn Cadet M.D.   On: 11/05/2017 09:04     Assessment/Plan: 47 y.o. female with a known history of remote stroke at age 53, without residual symptoms, tobacco/nicotine abuse, migraine headaches, seizures. Patient was brought to the hospital for acute onset  of slurred speech, right facial and right arm numbness which all have improved. Currently complaining of R sided pressure type of headache.  MRI no acute abnormalities.   - Likely complicated migraine - hx of headaches last one about 2 weeks ago - Neurologically intact - Decadron 10 mg now - d/c planning likely today with Magnesium (500-600mg ) and vitamin B complex 1 tab daily  Joaquin Knebel  11/05/2017, 11:03 AM

## 2017-11-05 NOTE — Discharge Instructions (Signed)
Please follow up with your Neurologist in [place] within a week

## 2017-11-05 NOTE — Progress Notes (Signed)
OT Cancellation Note  Patient Details Name: [name] MRN: [id number] DOB: [birth year]   Cancelled Treatment:    Reason Eval/Treat Not Completed: Other (comment). Order received, chart reviewed. Upon attempt, pt with nursing. Pt noted with discharge orders already placed. PT and SLP signed off already. Will re-attempt at later date/time as pt is available and medically appropriate.    Jeni Salles, MPH, MS, OTR/L ascom [phone] 11/05/17, 2:44 PM

## 2017-11-05 NOTE — Consult Note (Signed)
[place] for Electrolyte Monitoring and Replacement   Labs: Recent Labs    11/04/17 1948  WBC 6.3  HGB 13.4  HCT 37.9  PLT 278  APTT 29  CREATININE 0.61  MG 2.0  ALBUMIN 4.0  PROT 6.7  AST 47*  ALT 36  ALKPHOS 105  BILITOT 0.4  . Estimated Creatinine Clearance: 82.2 mL/min (by C-G formula based on SCr of 0.61 mg/dL).  Assessment: Pharmacy consulted for electrolyte monitoring and replacement in 47 yo female admitted for CVA.   Goal of Therapy:  Electrolytes WNL  Plan:  K: 4.0     Pharmacy will continue to follow labs and replace as needed.    Noralee Space, PharmD, BCPS Clinical Pharmacist 11/05/2017 9:41 AM

## 2017-11-05 NOTE — Consult Note (Signed)
PHARMACY CONSULT NOTE - INITIAL   Pharmacy Consult for Electrolyte Monitoring and Replacement   Labs: Recent Labs    11/04/17 1948  WBC 6.3  HGB 13.4  HCT 37.9  PLT 278  APTT 29  CREATININE 0.61  MG 2.0  ALBUMIN 4.0  PROT 6.7  AST 47*  ALT 36  ALKPHOS 105  BILITOT 0.4  . Estimated Creatinine Clearance: 82.2 mL/min (by C-G formula based on SCr of 0.61 mg/dL).  Assessment: Pharmacy consulted for electrolyte monitoring and replacement in 46 yo female admitted for CVA.   Goal of Therapy:  Electrolytes WNL  Plan:  K: 3.0   Mg: 2.0  - KCL 45mEq x 1 doses ordered.  Will recheck K+ @ 0800  Pharmacy will continue to follow labs and replace as needed.   Pernell Dupre, PharmD, BCPS Clinical Pharmacist 11/05/2017 3:03 AM

## 2017-11-07 LAB — HIV ANTIBODY (ROUTINE TESTING W REFLEX): HIV Screen 4th Generation wRfx: NONREACTIVE

## 2017-11-08 ENCOUNTER — Other Ambulatory Visit: Payer: Self-pay | Admitting: *Deleted

## 2017-11-08 NOTE — Patient Outreach (Addendum)
[place] [facility]) Care Management  11/08/2017  [name] [birth year] [id number]   EMMI- general discharge after [facility] admission/discharge 11/04/17 to 11/05/17  RED ON EMMI ALERT Day # 1 Date: 11/07/17 1241  Red Alert Reason: know who to call about changes in condition? No Scheduled follow up? No   Outreach attempt #1 No answer. [facility] RN CM left HIPAA compliant voicemail message along with CM's contact info.   Conditions remote stroke at age 29, without residual symptoms, tobacco/nicotine abuse, migraine headaches, seizures, COPD, Anxiety, GERD, sleep apnea, stroke, bilateral shoulder pain, arthritis, chronic neck pain, depression, nasal septal perforation, hysterectomy  Appointments: primary MD Dr Neldon Mc, [place] Neurologist   Plan: [facility] RN CM sent an unsuccessful outreach letter and scheduled this patient for another call attempt within 4 business days  [place]. Lavina Hamman, RN, BSN, [place] Coordinator Office number [phone] Mobile number [phone]  Main [facility] number [phone] Fax number [phone]

## 2017-11-09 ENCOUNTER — Other Ambulatory Visit: Payer: Self-pay | Admitting: *Deleted

## 2017-11-09 NOTE — Patient Outreach (Signed)
[name]) Care Management  11/09/2017  [name] [birth year] [id number]   EMMI- general discharge after [facility] admission/discharge 11/04/17 to 11/05/17  RED ON EMMI ALERT Day # 1 Date: 11/07/17 1241  Red Alert Reason: know who to call about changes in condition? No Scheduled follow up? No   Outreach attempt #2 successful Patient is able to verify HIPAA [place] Management RN reviewed and addressed red alert with patient [name] confirms her answers were correct She and CM reviewed that her primary MD, Dr Candiss Norse would be the MD she would now call about changes in her condition. She does not have a follow up appointment but has been experiencing migraine headaches and some numbness since her discharge and reports her Topomax is not "working" She has used CBD oil also without resolution.  Cm discussed a referral to [place] to have some of her questions asked and she welcomes this referral  CM also discussed the importance of hospital follow up appointments and she states she will try her MD again next week   Social: She lives with her husband who is very supportive. She reports he is out of work at this time and assists with transportation but she is interested in alternative transportation services to get to a [place] provider once her husband returns to work She states she had medicaid in 2018 and lost the services She is aware that [place] transportation cover is limited (mileage)   Conditions: Medications: Appointments: Advance Directives: Consent: [place] reviewed [facility] services with patient. Patient gave verbal consent for services.  Advised patient that there will be further automated EMMI- post discharge calls to assess how the patient is doing following the recent hospitalization Advised the patient that another call may be received from a nurse if any of their responses were abnormal. Patient voiced understanding and was appreciative of  f/u call.   Conditions remote stroke at age 8, without residual symptoms, tobacco/nicotine abuse, migraine headaches, seizures, COPD, Anxiety, GERD, sleep apnea, stroke, bilateral shoulder pain, arthritis, chronic neck pain, depression, nasal septal perforation, hysterectomy  Appointments: primary MD Dr Neldon Mc none at this time , [facility] [place] Neurologist none at this time  To make appointments next week    Plan: [place] will refr [name] to [place] (questions about migraine medication treatment and side effects)  and [place] (alternative transportation resources to [place])  [place]. Lavina Hamman, RN, BSN, [place] Coordinator Office number [phone] Mobile number [phone]  Main [facility] number [phone] Fax number [phone]

## 2017-11-13 ENCOUNTER — Other Ambulatory Visit: Payer: Self-pay | Admitting: Pharmacist

## 2017-11-13 ENCOUNTER — Other Ambulatory Visit: Payer: Self-pay

## 2017-11-13 ENCOUNTER — Ambulatory Visit: Payer: Self-pay | Admitting: Pharmacist

## 2017-11-13 NOTE — Patient Outreach (Signed)
[place] [facility]) Care Management  11/13/2017  Ruqayya Ventress 04-Nov-1970 [id number]  Successful call to the patient on today's date. BSW introduced self to the patient and the reason for today's call, indicating this BSW recevied a referral to assist with transportation. The patient reports her neurologist is located in [place] and she is concerned about how to get to a needed appointment. The patient has [employer] and is aware of the transportation benefit. Unfortunately, [place] is further than 25 miles which is covered under her plan. The patient reports she used to have Medicaid but lost the coverage due to her husband receiving a small raise.  BSW discussed limited options with transportation from [facility] to [facility]. The patient does not want to switch providers given "I really like him". BSW provided the patient with the contact information to the [employer] bus. BSW explained this bus has routes that go from [employer] to [place]. BSW then explained the patient would have to transfer to another but for a [facility] route. The patient stated "this seems doable". The patient is to contact [employer] to speak with a representative for more information.  BSW to perform a discipline closure as all resources have been provided.  Daneen Schick, BSW, CDP [facility] [phone]

## 2017-11-13 NOTE — Patient Outreach (Signed)
[place] [facility]) Care Management  11/13/2017  [name] 11/14/1970 [id number]  47 yoF referred to [facility] for medication management. PMH history significant for COPD, Migraines, GERD.  Successful outreach call to [name] regarding medication management.  Patient states she has a significant history of migraines, however she has most recently been diagnosed with a complicated migraine (Stroke ruled out).  She states she continues to have numbness.  She was discharged home with magnesium and vitamin B complex and has been using CBD oil with pretty good success.  She recently just started herself back on low dose Topamax with a slow up titration.  There are multiple active stressors in her life.  For abortive therapy, she states triptans do not work and actually make it worse and patient is scared to try it.  Suggested she discuss the next steps with her PCP/neurologist given her complicated history.  Offered patient data about melatonin for migraine PX and suggested she talk to her MDs before starting or stopping any of her regimens.   [facility] Pharmacist called [facility] and booked patient appointment for Thursday The 12th @11am . From there, she can re-establish with her neurologist.  PLAN: -I will f/u with patient within 2 wks to see status  Regina Eck, PharmD, [place]  [phone]

## 2017-11-13 NOTE — Discharge Summary (Signed)
[facility] NAME: [name]    MR#:  [id number]  DATE OF BIRTH:  03-Mar-1971  DATE OF ADMISSION:  11/04/2017 ADMITTING PHYSICIAN: Amelia Jo, MD  DATE OF DISCHARGE: 11/05/2017  3:03 PM  PRIMARY CARE PHYSICIAN: Glendon Axe, MD   ADMISSION DIAGNOSIS:  Syncope, unspecified syncope type [R55] Cerebrovascular accident (CVA), unspecified mechanism ([place]) [I63.9]  DISCHARGE DIAGNOSIS:  Active Problems:   Migraine   SECONDARY DIAGNOSIS:   Past Medical History:  Diagnosis Date  . Anxiety   . Arthritis   . Asthma   . Bilateral shoulder pain   . Chronic neck pain   . Depression   . GERD (gastroesophageal reflux disease)   . Migraines   . Nasal septal perforation   . Seizure ([place])   . Sleep apnea   . Stroke [facility])      ADMITTING HISTORY  HISTORY OF PRESENT ILLNESS: [name]  is a 47 y.o. female with a known history of remote stroke at age 41, without residual symptoms, tobacco/nicotine abuse, migraine headaches, seizures. Patient was brought to the hospital for acute onset of slurred speech, right facial and right arm numbness.  Patient also complains of severe migraine headache, affecting mainly the left side of the head, started even before the slurred speech and numbness.  No vision changes, no focal weakness.  She denies using alcohol or illicit drugs. Currently, during my evaluation in the emergency room, patient feels better status post pain medication.  Her headache is almost completely resolved and her arm numbness is completely resolved.  However, her speech is still slurred and she still complains of right facial numbness. Blood test done in the emergency room, including CBC and CMP are grossly unremarkable, except for low potassium at 3. CT of the head is negative for acute intracranial abnormalities. Patient is admitted for further evaluation and treatment.  HOSPITAL COURSE:   * Complicated  migraine Patient presented with right facial and arm numbness with slurred speech.  Symptoms improved well.  Had some persistence of numbness.  MRI of the brain was checked and normal. Seen by neurology.  Did not think it is stroke.  Patient ambulated well with physical therapy without any problems.  Diagnosed with complicated migraine.  Given dose of Decadron.  Discharged with magnesium and vitamin B complex.  Stable for discharge home  CONSULTS OBTAINED:  Treatment Team:  Leotis Pain, MD  DRUG ALLERGIES:   Allergies  Allergen Reactions  . Bee Venom Anaphylaxis  . Shellfish Allergy Anaphylaxis  . Contrast Media [Iodinated Diagnostic Agents] Other (See Comments)    Unknown  Reaction:  Unknown   . Other Other (See Comments)    Unknown   . Penicillins Other (See Comments)    Reaction:  Seizures  Has patient had a PCN reaction causing immediate rash, facial/tongue/throat swelling, SOB or lightheadedness with hypotension: No Has patient had a PCN reaction causing severe rash involving mucus membranes or skin necrosis: No Has patient had a PCN reaction that required hospitalization Yes Has patient had a PCN reaction occurring within the last 10 years: Yes If all of the above answers are "NO", then may proceed with Cephalosporin use.  . Sulfa Antibiotics Itching    DISCHARGE MEDICATIONS:   Allergies as of 11/05/2017      Reactions   Bee Venom Anaphylaxis   Shellfish Allergy Anaphylaxis   Contrast Media [iodinated Diagnostic Agents] Other (See Comments)   Unknown  Reaction:  Unknown  Other Other (See Comments)   Unknown    Penicillins Other (See Comments)   Reaction:  Seizures  Has patient had a PCN reaction causing immediate rash, facial/tongue/throat swelling, SOB or lightheadedness with hypotension: No Has patient had a PCN reaction causing severe rash involving mucus membranes or skin necrosis: No Has patient had a PCN reaction that required hospitalization Yes Has  patient had a PCN reaction occurring within the last 10 years: Yes If all of the above answers are "NO", then may proceed with Cephalosporin use.   Sulfa Antibiotics Itching      Medication List    STOP taking these medications   prazosin 1 MG capsule Commonly known as:  MINIPRESS     TAKE these medications   acetaminophen 500 MG tablet Commonly known as:  TYLENOL Take 1,000 mg by mouth every 6 (six) hours as needed for mild pain or headache.   albuterol 108 (90 Base) MCG/ACT inhaler Commonly known as:  PROVENTIL HFA;VENTOLIN HFA Inhale 2 puffs into the lungs every 6 (six) hours as needed for wheezing or shortness of breath.   aspirin EC 81 MG tablet Take 81 mg by mouth daily.   b complex vitamins capsule Take 1 capsule by mouth daily.   busPIRone 10 MG tablet Commonly known as:  BUSPAR Take 1 tablet (10 mg total) by mouth 3 (three) times daily.   dicyclomine 10 MG capsule Commonly known as:  BENTYL Take 10 mg by mouth. Reported on 09/09/2015   escitalopram 20 MG tablet Commonly known as:  LEXAPRO Take 1 tablet (20 mg total) by mouth daily.   FLOVENT HFA 110 MCG/ACT inhaler Generic drug:  fluticasone Inhale 1 puff into the lungs 2 (two) times daily.   gabapentin 300 MG capsule Commonly known as:  NEURONTIN Take 1 capsule (300 mg total) by mouth 2 (two) times daily.   Magnesium 500 MG Caps Take 1 capsule (500 mg total) by mouth daily.   meloxicam 7.5 MG tablet Commonly known as:  MOBIC Take 7.5-15 mg by mouth daily as needed for pain.   multivitamin tablet Take 1 tablet by mouth daily.   NON FORMULARY Place 5 drops under the tongue 2 (two) times daily. CBD OIL   pantoprazole 40 MG tablet Commonly known as:  PROTONIX Take 40 mg by mouth daily. Reported on 08/03/2015   topiramate 50 MG tablet Commonly known as:  TOPAMAX Take 1 tablet in the morning, 2 tablets at night until 8/29. Take 2 tablets in the morning and night thereafter.   triamcinolone 0.025 %  ointment Commonly known as:  KENALOG Apply 1 application topically 2 (two) times daily.       Today   VITAL SIGNS:  Blood pressure 115/73, pulse 71, temperature (!) 97.5 F (36.4 C), temperature source Oral, resp. rate 18, height 5\' 3"  (1.6 m), weight 74.4 kg, SpO2 100 %.  I/O:  No intake or output data in the 24 hours ending 11/13/17 1448  PHYSICAL EXAMINATION:  Physical Exam  GENERAL:  47 y.o.-year-old patient lying in the bed with no acute distress.  LUNGS: Normal breath sounds bilaterally, no wheezing, rales,rhonchi or crepitation. No use of accessory muscles of respiration.  CARDIOVASCULAR: S1, S2 normal. No murmurs, rubs, or gallops.  ABDOMEN: Soft, non-tender, non-distended. Bowel sounds present. No organomegaly or mass.  NEUROLOGIC: Moves all 4 extremities. PSYCHIATRIC: The patient is alert and oriented x 3.  SKIN: No obvious rash, lesion, or ulcer.   DATA REVIEW:   CBC No results  for input(s): WBC, HGB, HCT, PLT in the last 168 hours.  Chemistries  No results for input(s): NA, K, CL, CO2, GLUCOSE, BUN, CREATININE, CALCIUM, MG, AST, ALT, ALKPHOS, BILITOT in the last 168 hours.  Invalid input(s): GFRCGP  Cardiac Enzymes No results for input(s): TROPONINI in the last 168 hours.  Microbiology Results  Results for orders placed or performed during the hospital encounter of 06/22/15  MRSA PCR Screening     Status: None   Collection Time: 06/22/15 11:04 PM  Result Value Ref Range Status   MRSA by PCR NEGATIVE NEGATIVE Final    Comment:        The GeneXpert MRSA Assay (FDA approved for NASAL specimens only), is one component of a comprehensive MRSA colonization surveillance program. It is not intended to diagnose MRSA infection nor to guide or monitor treatment for MRSA infections.     RADIOLOGY:  No results found.  Follow up with PCP in 1 week.  Management plans discussed with the patient, family and they are in agreement.  CODE STATUS:  Code  Status History    Date Active Date Inactive Code Status Order ID Comments User Context   11/05/2017 0019 11/05/2017 1827 Full Code [id number]  Amelia Jo, MD Inpatient   10/26/2015 1703 10/28/2015 1950 Full Code [id number]  Riccardo Dubin, MD ED   06/22/2015 1814 06/27/2015 1810 Full Code [id number]  Max Sane, MD Inpatient   05/10/2015 2302 05/12/2015 1445 Full Code [id number]  Vaughan Basta, MD Inpatient   11/03/2014 2320 11/04/2014 1918 Full Code [id number]  Hower, Aaron Mose, MD ED    Advance Directive Documentation     Most Recent Value  Type of Advance Directive  Healthcare Power of Attorney, Living will  Pre-existing out of facility DNR order (yellow form or pink MOST form)  -  "MOST" Form in Place?  -      TOTAL TIME TAKING CARE OF THIS PATIENT ON DAY OF DISCHARGE: more than 30 minutes.   Leia Alf Asencion Guisinger M.D on 11/13/2017 at 2:48 PM  Between 7am to 6pm - Pager - [phone]  After 6pm go to [email or web address] - password [email or web address] [facility]  Office  [phone]  CC: Primary care physician; Glendon Axe, MD  Note: This dictation was prepared with Dragon dictation along with smaller phrase technology. Any transcriptional errors that result from this process are unintentional.

## 2017-11-15 DIAGNOSIS — R748 Abnormal levels of other serum enzymes: Secondary | ICD-10-CM | POA: Diagnosis not present

## 2017-11-15 DIAGNOSIS — L819 Disorder of pigmentation, unspecified: Secondary | ICD-10-CM | POA: Diagnosis not present

## 2017-11-15 DIAGNOSIS — N951 Menopausal and female climacteric states: Secondary | ICD-10-CM | POA: Diagnosis not present

## 2017-11-15 DIAGNOSIS — K219 Gastro-esophageal reflux disease without esophagitis: Secondary | ICD-10-CM | POA: Diagnosis not present

## 2017-11-21 DIAGNOSIS — L989 Disorder of the skin and subcutaneous tissue, unspecified: Secondary | ICD-10-CM | POA: Diagnosis not present

## 2017-11-23 DIAGNOSIS — G43119 Migraine with aura, intractable, without status migrainosus: Secondary | ICD-10-CM | POA: Diagnosis not present

## 2017-11-27 ENCOUNTER — Ambulatory Visit: Payer: Self-pay | Admitting: Pharmacist

## 2017-11-28 DIAGNOSIS — D485 Neoplasm of uncertain behavior of skin: Secondary | ICD-10-CM | POA: Diagnosis not present

## 2017-11-28 DIAGNOSIS — D2121 Benign neoplasm of connective and other soft tissue of right lower limb, including hip: Secondary | ICD-10-CM | POA: Diagnosis not present

## 2017-11-28 DIAGNOSIS — L989 Disorder of the skin and subcutaneous tissue, unspecified: Secondary | ICD-10-CM | POA: Diagnosis not present

## 2017-11-29 ENCOUNTER — Ambulatory Visit: Payer: Self-pay | Admitting: Pharmacist

## 2017-11-29 ENCOUNTER — Other Ambulatory Visit: Payer: Self-pay | Admitting: Pharmacist

## 2017-11-29 NOTE — Patient Outreach (Addendum)
[facility] [facility]) Care Management  11/29/2017  [name] 1970/11/17 [id number]  Unsuccessful outreach to [name].  HIPAA compliant voicemail was left on patient's voicemail encouraging call back.  Incoming call from [name].  Patient was so appreciative that [facility] [facility] Management continues to call.  Patient stated she had a neurologist visit scheduled for 11/30/17.  Patient states her migraines continue to occur.  PLAN: [facility] pharmacist will follow up with patient s/p neurologist visit to see if patient has any additional questions regarding any new starts/deletions    Regina Eck, PharmD, [place]  [phone]

## 2017-11-30 DIAGNOSIS — G43119 Migraine with aura, intractable, without status migrainosus: Secondary | ICD-10-CM | POA: Diagnosis not present

## 2017-12-05 ENCOUNTER — Emergency Department: Payer: Medicare HMO

## 2017-12-05 ENCOUNTER — Other Ambulatory Visit: Payer: Self-pay

## 2017-12-05 ENCOUNTER — Emergency Department
Admission: EM | Admit: 2017-12-05 | Discharge: 2017-12-05 | Disposition: A | Discharge status: Home / Self Care | Payer: Medicare HMO | Attending: Emergency Medicine | Admitting: Emergency Medicine

## 2017-12-05 ENCOUNTER — Ambulatory Visit: Payer: Self-pay | Admitting: Pharmacist

## 2017-12-05 DIAGNOSIS — Z8673 Personal history of transient ischemic attack (TIA), and cerebral infarction without residual deficits: Secondary | ICD-10-CM | POA: Insufficient documentation

## 2017-12-05 DIAGNOSIS — R569 Unspecified convulsions: Secondary | ICD-10-CM | POA: Insufficient documentation

## 2017-12-05 DIAGNOSIS — R4182 Altered mental status, unspecified: Secondary | ICD-10-CM | POA: Diagnosis not present

## 2017-12-05 DIAGNOSIS — J45909 Unspecified asthma, uncomplicated: Secondary | ICD-10-CM | POA: Insufficient documentation

## 2017-12-05 DIAGNOSIS — G40909 Epilepsy, unspecified, not intractable, without status epilepticus: Secondary | ICD-10-CM | POA: Diagnosis not present

## 2017-12-05 DIAGNOSIS — F17290 Nicotine dependence, other tobacco product, uncomplicated: Secondary | ICD-10-CM | POA: Diagnosis not present

## 2017-12-05 DIAGNOSIS — R55 Syncope and collapse: Secondary | ICD-10-CM | POA: Diagnosis not present

## 2017-12-05 DIAGNOSIS — I10 Essential (primary) hypertension: Secondary | ICD-10-CM | POA: Diagnosis not present

## 2017-12-05 LAB — CBC WITH DIFFERENTIAL/PLATELET
Basophils Absolute: 0.1 10*3/uL (ref 0–0.1)
Basophils Relative: 1 %
EOS ABS: 0.1 10*3/uL (ref 0–0.7)
EOS PCT: 1 %
HCT: 39.7 % (ref 35.0–47.0)
Hemoglobin: 14 g/dL (ref 12.0–16.0)
Lymphocytes Relative: 24 %
Lymphs Abs: 1.8 10*3/uL (ref 1.0–3.6)
MCH: 30.1 pg (ref 26.0–34.0)
MCHC: 35.3 g/dL (ref 32.0–36.0)
MCV: 85.4 fL (ref 80.0–100.0)
MONO ABS: 0.6 10*3/uL (ref 0.2–0.9)
MONOS PCT: 9 %
Neutro Abs: 5 10*3/uL (ref 1.4–6.5)
Neutrophils Relative %: 65 %
PLATELETS: 294 10*3/uL (ref 150–440)
RBC: 4.65 MIL/uL (ref 3.80–5.20)
RDW: 12.5 % (ref 11.5–14.5)
WBC: 7.6 10*3/uL (ref 3.6–11.0)

## 2017-12-05 LAB — COMPREHENSIVE METABOLIC PANEL
ALT: 25 U/L (ref 0–44)
AST: 30 U/L (ref 15–41)
Albumin: 4.5 g/dL (ref 3.5–5.0)
Alkaline Phosphatase: 110 U/L (ref 38–126)
Anion gap: 9 (ref 5–15)
BUN: 8 mg/dL (ref 6–20)
CO2: 26 mmol/L (ref 22–32)
CREATININE: 0.79 mg/dL (ref 0.44–1.00)
Calcium: 9.4 mg/dL (ref 8.9–10.3)
Chloride: 104 mmol/L (ref 98–111)
GFR calc Af Amer: >60 mL/min (ref >60)
GLUCOSE: 114 mg/dL — AB (ref 70–99)
Potassium: 3.9 mmol/L (ref 3.5–5.1)
Sodium: 139 mmol/L (ref 135–145)
Total Bilirubin: 0.6 mg/dL (ref 0.3–1.2)
Total Protein: 7.5 g/dL (ref 6.5–8.1)

## 2017-12-05 LAB — TROPONIN I

## 2017-12-05 LAB — GLUCOSE, CAPILLARY: Glucose-Capillary: 104 mg/dL — ABNORMAL HIGH (ref 70–99)

## 2017-12-05 MED ORDER — LORAZEPAM 2 MG/ML IJ SOLN
INTRAMUSCULAR | Status: AC
  Filled 2017-12-05: qty 1

## 2017-12-05 MED ORDER — CLOBAZAM 10 MG PO TABS: 10.0000 mg | ORAL_TABLET | Freq: Every day | ORAL | 1 refills | Status: DC

## 2017-12-05 MED ORDER — LORAZEPAM 2 MG/ML IJ SOLN: 2.0000 mg | Freq: Once | INTRAMUSCULAR | Status: DC

## 2017-12-05 MED ORDER — LORAZEPAM 2 MG/ML IJ SOLN
1.0000 mg | Freq: Once | INTRAMUSCULAR | Status: AC
  Administered 2017-12-05: 1 mg via INTRAVENOUS

## 2017-12-05 MED ORDER — ONDANSETRON HCL 4 MG/2ML IJ SOLN
INTRAMUSCULAR | Status: AC
  Filled 2017-12-05: qty 2

## 2017-12-05 MED ORDER — LEVETIRACETAM IN NACL 1000 MG/100ML IV SOLN
1000.0000 mg | Freq: Once | INTRAVENOUS | Status: DC
  Filled 2017-12-05: qty 100

## 2017-12-05 MED ORDER — LORAZEPAM 2 MG/ML IJ SOLN
2.0000 mg | Freq: Once | INTRAMUSCULAR | Status: AC
  Administered 2017-12-05: 1 mg via INTRAVENOUS

## 2017-12-05 MED ORDER — ONDANSETRON HCL 4 MG/2ML IJ SOLN
4.0000 mg | Freq: Once | INTRAMUSCULAR | Status: AC
  Administered 2017-12-05: 4 mg via INTRAVENOUS

## 2017-12-05 MED ORDER — LORAZEPAM 2 MG/ML IJ SOLN
INTRAMUSCULAR | Status: AC
  Administered 2017-12-05: 1 mg via INTRAVENOUS
  Filled 2017-12-05: qty 1

## 2017-12-05 MED ORDER — LORAZEPAM 2 MG/ML IJ SOLN
1.0000 mg | Freq: Once | INTRAMUSCULAR | Status: AC
  Administered 2017-12-05 (×2): 1 mg via INTRAVENOUS

## 2017-12-05 NOTE — ED Notes (Signed)
Dr Jimmye Norman at bedside and pt is alert and giving full account of medical history and all medications that she has tried for seizures - per Dr Jimmye Norman hold Keppra

## 2017-12-05 NOTE — ED Notes (Signed)
Called to room by patient. Pt states she was trying to move toward end of bed and saw her surgical incision to R thigh re-open. Pt states she had a lesion removed from that site recently and just had sutures removed today. Bleeding controlled. EDP notified.

## 2017-12-05 NOTE — ED Notes (Signed)
Pt noted to be having seizure like activity but can respond and answer questions during seizure and state "I am having seizure"

## 2017-12-05 NOTE — ED Notes (Signed)
Pt noted to be having seizure like activity - Dr Quentin Cornwall notified and reported to bedside - VO given for Ativan 2mg  at this time

## 2017-12-05 NOTE — ED Notes (Addendum)
Dr Jimmye Norman at bedside and VO given to only give Ativan 1mg  at this time - pt is able to open mouth on command to suction - during none of the 3 observed seizure like activity did pt oxygen saturation drop

## 2017-12-05 NOTE — ED Notes (Signed)
Pt is awake and talking to staff and giving account of the events that have happened today - making jokes that "the doctor was so cute I must have passed out" - pt refusing to take [place] stating that it makes her angry - Denice Paradise RN notifying Dr Jimmye Norman

## 2017-12-05 NOTE — ED Notes (Signed)
Pt is able to open her eyes when spoken to and follow with her eyes but is not verbal at this time

## 2017-12-05 NOTE — ED Notes (Signed)
Pt is able to respond to verbal stimuli and speak to this nurse stating "I am sick" - pt can state name - pt is able to track nurse with eye and track from one side of room to the other and turn head to appropriate side

## 2017-12-05 NOTE — ED Notes (Signed)
Due to amount of ativan given to pt, pt requires a ride home. Pt has contacted husband to come give her a ride. Waiting on response.

## 2017-12-05 NOTE — ED Provider Notes (Signed)
[facility] Emergency Department Provider Note       Time seen: ----------------------------------------- 11:12 AM on 12/05/2017 ----------------------------------------- Level V caveat: History/ROS limited by altered mental status  I have reviewed the triage vital signs and the nursing notes.  HISTORY   Chief Complaint Seizures and Loss of Consciousness    HPI [name] is a 47 y.o. female with a history of anxiety, arthritis, asthma, shoulder pain, depression, GERD, seizure, sleep apnea who presents to the ED for possible seizure-like activity.  Patient arrives from [facility] with a report of possible seizure or syncope.  Patient was brought in and seem to be postictal.  She was having sutures removed from her right leg from a recent procedure.  She cannot give any further review of systems or report.  Past Medical History:  Diagnosis Date  . Anxiety   . Arthritis   . Asthma   . Bilateral shoulder pain   . Chronic neck pain   . Depression   . GERD (gastroesophageal reflux disease)   . Migraines   . Nasal septal perforation   . Seizure ([place])   . Sleep apnea   . Stroke [facility])     Patient Active Problem List   Diagnosis Date Noted  . Migraine 11/05/2017  . RAD (reactive airway disease), moderate persistent, uncomplicated 52/84/1324  . Acute cystitis without hematuria   . Change in mental status 10/26/2015  . Nonepileptic episode ([place]) 10/26/2015  . Adult BMI 30+ 10/18/2015  . Atopic rhinitis 10/18/2015  . Reactive airway disease without complication 40/12/2723  . Recurrent major depressive disorder, in partial remission ([place]) 10/18/2015  . Snoring 10/18/2015  . Other epilepsy, intractable, without status epilepticus ([place]) 10/04/2015  . Spells of speech arrest 08/10/2015  . Shaking spells 08/10/2015  . Apneic spell 08/10/2015  . Primary osteoarthritis involving multiple joints 07/25/2015  . Hot flashes 07/25/2015  . Chronic pain  of both shoulders 06/26/2015  . Spondylosis of cervical region without myelopathy or radiculopathy 06/26/2015  . Pseudoseizure   . Status epilepticus due to refractory epilepsy ([place]) 06/22/2015  . Chronic nonintractable headache 06/04/2015  . Blurry vision, right eye 05/18/2015  . Depression, major, single episode, complete remission ([place]) 05/18/2015  . Renal calculus 05/18/2015  . Pseudoseizures 05/11/2015  . Migraines 05/11/2015  . Abdominal pain 05/10/2015  . Allergy to bee sting 12/01/2014  . Pain in toe of left foot 12/01/2014  . Nausea 12/01/2014  . Gastroesophageal reflux disease without esophagitis 12/01/2014  . Difficulty breathing 10/19/2014  . Seizures ([place]) 05/17/2014  . Respiratory failure, acute ([place]) 05/16/2014  . Depression, unspecified depression type 05/14/2014  . Headache disorder 04/13/2014  . Spells 04/13/2014  . Anxiety 01/07/2014  . Depression 01/07/2014  . Acute headache 12/02/2013  . Rotator cuff (capsule) sprain and strain 11/27/2013  . Midline low back pain without sciatica 10/29/2013  . Localized swelling, mass and lump, neck 10/29/2013  . Seizure disorder ([place]) 10/29/2013  . COPD (chronic obstructive pulmonary disease) ([place])   . Emphysema/COPD ([place])   . Sleep apnea   . Stroke ([place])   . Pain, chronic 07/11/2013    Past Surgical History:  Procedure Laterality Date  . ABDOMINAL HYSTERECTOMY     partial  . CESAREAN SECTION  1993  . PARTIAL HYSTERECTOMY  1995  . TONSILLECTOMY      Allergies Bee venom; Shellfish allergy; Contrast media [iodinated diagnostic agents]; Other; Penicillins; and Sulfa antibiotics  Social History Social History   Tobacco Use  .  Smoking status: Current Every Day Smoker    Types: E-cigarettes  . Smokeless tobacco: Never Used  Substance Use Topics  . Alcohol use: No  . Drug use: No   Review of Systems Unknown at this time, patient with altered mental status  All systems negative/normal/unremarkable except as  stated in the HPI  ____________________________________________   PHYSICAL EXAM:  VITAL SIGNS: ED Triage Vitals [12/05/17 1110]  Enc Vitals Group     BP      Pulse      Resp      Temp      Temp src      SpO2      Weight 150 lb (68 kg)     Height 5\' 5"  (1.651 m)     Head Circumference      Peak Flow      Pain Score      Pain Loc      Pain Edu?      Excl. in [place]?    Constitutional: Lethargic, no distress Eyes: Conjunctivae are normal. Normal extraocular movements. ENT   Head: Normocephalic and atraumatic.   Nose: No congestion/rhinnorhea.   Mouth/Throat: Mucous membranes are moist.   Neck: No stridor. Cardiovascular: Normal rate, regular rhythm. No murmurs, rubs, or gallops. Respiratory: Normal respiratory effort without tachypnea nor retractions. Breath sounds are clear and equal bilaterally. No wheezes/rales/rhonchi. Gastrointestinal: Soft and nontender. Normal bowel sounds Musculoskeletal: Nontender with normal range of motion in extremities.  Recent procedure noted to the right upper thigh with sutures in place Neurologic:  Normal speech and language. No gross focal neurologic deficits are appreciated.  Skin:  Skin is warm, dry and intact. No rash noted. Psychiatric: Mood and affect are normal. Speech and behavior are normal.  ____________________________________________  EKG: Interpreted by me.  Sinus rhythm the rate of 74 bpm, normal PR interval, normal QT, likely early repolarization pattern  ____________________________________________  ED COURSE:  As part of my medical decision making, I reviewed the following data within the [place] History obtained from family if available, nursing notes, old chart and ekg, as well as notes from prior ED visits. Patient presented for altered mental status, we will assess with labs and imaging as indicated at this time.   Procedures ____________________________________________   LABS  (pertinent positives/negatives)  Labs Reviewed  COMPREHENSIVE METABOLIC PANEL - Abnormal; Notable for the following components:      Result Value   Glucose, Bld 114 (*)    All other components within normal limits  GLUCOSE, CAPILLARY - Abnormal; Notable for the following components:   Glucose-Capillary 104 (*)    All other components within normal limits  CBC WITH DIFFERENTIAL/PLATELET  TROPONIN I  URINALYSIS, COMPLETE (UACMP) WITH MICROSCOPIC  CBG MONITORING, ED  CBG MONITORING, ED    RADIOLOGY Images were viewed by me  CT head IMPRESSION: No evident acute infarct. No gray matter or white matter lesions are demonstrable on this noncontrast enhanced study. No mass or hemorrhage.  Mucosal thickening noted in several ethmoid air cells. Deviated nasal septum. ____________________________________________  DIFFERENTIAL DIAGNOSIS   Seizure, CVA, hypoglycemia, vasovagal syncope, arrhythmia, MI  FINAL ASSESSMENT AND PLAN  Pseudoseizure   Plan: The patient had presented for altered mental status. Patient's labs are unremarkable. Patient's imaging did not reveal any acute process.  I spent significant time reviewing her chart which did reveal pseudoseizures rather than actual epileptic seizures.  These are impressive in appearance and she does get tachycardic during them but they  do not seem to correlate with any EEG activity as previously noted.  She has been tried on numerous antiepileptics without success.  I did discuss with neurology here who recommended ONFI which I will prescribe.  Patient is in agreement with this plan.  She is under significant stress right now and advised she must find someone to talk to about her current stressors.   Laurence Aly, MD   Note: This note was generated in part or whole with voice recognition software. Voice recognition is usually quite accurate but there are transcription errors that can and very often do occur. I apologize for  any typographical errors that were not detected and corrected.     Earleen Newport, MD 12/05/17 1414

## 2017-12-05 NOTE — ED Notes (Signed)
Patient transported to CT 

## 2017-12-05 NOTE — ED Notes (Signed)
Pt having seizure like activity but when ask "are you ok" she stated "I'm sick" while still having seizure like activity - ask pt "what happened" and pt stated "I was shaking and having a seizure"

## 2017-12-05 NOTE — ED Triage Notes (Addendum)
Pt arrived from [facility] for report of "vagal down" causing her to pass out and then have a seizure - pt is post dictal at this time

## 2017-12-05 NOTE — ED Notes (Addendum)
Pt had another seizure like episode at 1200 lasting approx 45 seconds. Pt rolled to side, suctioned and provided oxygen. Pt currently having another seizure like episode at 1204 for 30 seconds. Dr Jimmye Norman notified and arrived to bedside 1204

## 2017-12-05 NOTE — ED Notes (Signed)

## 2017-12-05 NOTE — ED Notes (Signed)
Pt having seizure like activity noted by elevated heart rate - this nurse with Jinny Blossom RN went to room and pt was holding the side rails with tremors in legs/arms - when spoke to pt she looked at this nurse and stated "I am going to vomit" - emesis bag given to pt and she stopped shaking and took the bag with her left hand and held it without difficulty

## 2017-12-05 NOTE — ED Provider Notes (Signed)
Called into patient's room because of opening of wound to the right medial thigh.  Patient states that she had sutures removed 1 day ago from a biopsy site that was taken 7 days ago.  There is no active bleeding but there appears to have been some bleeding over what appears is healthy granulation tissue.  There is no surrounding erythema or induration.  There is no blood there appears to be dried granulation tissue which is started to heal.  I do not believe it is safe to close this wound after it has been already sutured and 15 days old.  The granulation tissue that is visible does not appear fresh although does not appear acutely infected either.  I feel that closing this lesion at this time will put the patient in a very high risk of infection.  I did counsel the patient that she should continue wound care with antibiotic ointment which she has been using.  We will adhere several Steri-Strips to give some tension to the wound edges were which are now approximately 1/2 cm apart.  Wound is approximately 4 to 5 cm long.   Orbie Pyo, MD 12/05/17 [phone]

## 2017-12-05 NOTE — ED Notes (Signed)
Pt noted to be having seizure like activity by housekeeping - charge RN Colletta Maryland to bedside and VO given by Dr Quentin Cornwall for Ativan 2 mg - when this nurse arrived to room pt had stopped seizure activity - she was able to respond to verbal stimuli and stated name for nurse - Dr Jimmye Norman to bedside and requested that only 1mg  of Ativan be given at this time

## 2017-12-06 ENCOUNTER — Emergency Department
Admission: EM | Admit: 2017-12-06 | Discharge: 2017-12-06 | Disposition: A | Discharge status: Home / Self Care | Payer: Medicare HMO | Attending: Emergency Medicine | Admitting: Emergency Medicine

## 2017-12-06 ENCOUNTER — Emergency Department: Payer: Medicare HMO

## 2017-12-06 ENCOUNTER — Encounter: Payer: Self-pay | Admitting: Emergency Medicine

## 2017-12-06 DIAGNOSIS — T148XXA Other injury of unspecified body region, initial encounter: Secondary | ICD-10-CM

## 2017-12-06 DIAGNOSIS — S199XXA Unspecified injury of neck, initial encounter: Secondary | ICD-10-CM | POA: Diagnosis not present

## 2017-12-06 DIAGNOSIS — G44309 Post-traumatic headache, unspecified, not intractable: Secondary | ICD-10-CM | POA: Diagnosis not present

## 2017-12-06 DIAGNOSIS — G40909 Epilepsy, unspecified, not intractable, without status epilepticus: Secondary | ICD-10-CM | POA: Diagnosis not present

## 2017-12-06 DIAGNOSIS — R569 Unspecified convulsions: Secondary | ICD-10-CM | POA: Diagnosis not present

## 2017-12-06 DIAGNOSIS — S0083XA Contusion of other part of head, initial encounter: Secondary | ICD-10-CM | POA: Diagnosis not present

## 2017-12-06 DIAGNOSIS — R51 Headache: Secondary | ICD-10-CM | POA: Diagnosis not present

## 2017-12-06 DIAGNOSIS — Y939 Activity, unspecified: Secondary | ICD-10-CM | POA: Insufficient documentation

## 2017-12-06 DIAGNOSIS — Z79899 Other long term (current) drug therapy: Secondary | ICD-10-CM | POA: Insufficient documentation

## 2017-12-06 DIAGNOSIS — J449 Chronic obstructive pulmonary disease, unspecified: Secondary | ICD-10-CM | POA: Insufficient documentation

## 2017-12-06 DIAGNOSIS — W19XXXA Unspecified fall, initial encounter: Secondary | ICD-10-CM | POA: Diagnosis not present

## 2017-12-06 DIAGNOSIS — Y92009 Unspecified place in unspecified non-institutional (private) residence as the place of occurrence of the external cause: Secondary | ICD-10-CM | POA: Diagnosis not present

## 2017-12-06 DIAGNOSIS — Z8673 Personal history of transient ischemic attack (TIA), and cerebral infarction without residual deficits: Secondary | ICD-10-CM | POA: Insufficient documentation

## 2017-12-06 DIAGNOSIS — S0993XA Unspecified injury of face, initial encounter: Secondary | ICD-10-CM | POA: Diagnosis not present

## 2017-12-06 DIAGNOSIS — M542 Cervicalgia: Secondary | ICD-10-CM | POA: Diagnosis not present

## 2017-12-06 DIAGNOSIS — F17210 Nicotine dependence, cigarettes, uncomplicated: Secondary | ICD-10-CM | POA: Insufficient documentation

## 2017-12-06 DIAGNOSIS — S0990XA Unspecified injury of head, initial encounter: Secondary | ICD-10-CM | POA: Diagnosis not present

## 2017-12-06 DIAGNOSIS — Z7982 Long term (current) use of aspirin: Secondary | ICD-10-CM | POA: Insufficient documentation

## 2017-12-06 DIAGNOSIS — Y999 Unspecified external cause status: Secondary | ICD-10-CM | POA: Insufficient documentation

## 2017-12-06 LAB — CBC WITH DIFFERENTIAL/PLATELET
BASOS ABS: 0.1 10*3/uL (ref 0–0.1)
Basophils Relative: 1 %
Eosinophils Absolute: 0 10*3/uL (ref 0–0.7)
Eosinophils Relative: 0 %
HEMATOCRIT: 39.4 % (ref 35.0–47.0)
Hemoglobin: 13.9 g/dL (ref 12.0–16.0)
LYMPHS ABS: 1.9 10*3/uL (ref 1.0–3.6)
LYMPHS PCT: 22 %
MCH: 30.6 pg (ref 26.0–34.0)
MCHC: 35.4 g/dL (ref 32.0–36.0)
MCV: 86.5 fL (ref 80.0–100.0)
Monocytes Absolute: 0.7 10*3/uL (ref 0.2–0.9)
Monocytes Relative: 8 %
NEUTROS PCT: 69 %
Neutro Abs: 6 10*3/uL (ref 1.4–6.5)
Platelets: 302 10*3/uL (ref 150–440)
RBC: 4.55 MIL/uL (ref 3.80–5.20)
RDW: 12.9 % (ref 11.5–14.5)
WBC: 8.7 10*3/uL (ref 3.6–11.0)

## 2017-12-06 LAB — COMPREHENSIVE METABOLIC PANEL
ALT: 25 U/L (ref 0–44)
AST: 33 U/L (ref 15–41)
Albumin: 4.6 g/dL (ref 3.5–5.0)
Alkaline Phosphatase: 109 U/L (ref 38–126)
Anion gap: 10 (ref 5–15)
BILIRUBIN TOTAL: 0.7 mg/dL (ref 0.3–1.2)
BUN: 9 mg/dL (ref 6–20)
CO2: 22 mmol/L (ref 22–32)
CREATININE: 0.65 mg/dL (ref 0.44–1.00)
Calcium: 9.2 mg/dL (ref 8.9–10.3)
Chloride: 106 mmol/L (ref 98–111)
GFR calc Af Amer: >60 mL/min (ref >60)
Glucose, Bld: 97 mg/dL (ref 70–99)
Potassium: 3.8 mmol/L (ref 3.5–5.1)
Sodium: 138 mmol/L (ref 135–145)
Total Protein: 7.5 g/dL (ref 6.5–8.1)

## 2017-12-06 NOTE — Discharge Instructions (Addendum)
Please seek medical attention for any high fevers, chest pain, shortness of breath, change in behavior, persistent vomiting, bloody stool or any other new or concerning symptoms.  

## 2017-12-06 NOTE — ED Triage Notes (Signed)
Pt arrived with husband after husband heard a loud noise in other room and went to find wife face down. Husband reports seizure like activity. Husband reports pt used to take seizure medication and took herself off "months ago" and starting using CBD oil for seizures instead. Pt's husband reports pt has been under a lot of stress lately. Pt alert in triage but confused. Pt states appropriate location, orientation to person, oriented to circumstance, but disorientation to date. Pt reports head and neck pain. Pt placed in c collar.  Husband reports nausea and vomiting that started today.

## 2017-12-06 NOTE — ED Provider Notes (Signed)
[facility] Emergency Department Provider Note   ____________________________________________   I have reviewed the triage vital signs and the nursing notes.   HISTORY  Chief Complaint Head and neck pain  History limited by: Not Limited   HPI [name] is a 47 y.o. female who presents to the emergency department today because of concerns for head and neck pain.  She states that she got them after an apparent seizure-like episode.  Apparently her husband found her on the floor.  Patient states that she had felt a little tired today.  She denies any preceding head trauma.  States the pain is all over her head and the pain is in the back of her neck.    Per medical record review patient has a history of recent er visit for seizure like activity.  Past Medical History:  Diagnosis Date  . Anxiety   . Arthritis   . Asthma   . Bilateral shoulder pain   . Chronic neck pain   . Depression   . GERD (gastroesophageal reflux disease)   . Migraines   . Nasal septal perforation   . Seizure ([place])   . Sleep apnea   . Stroke [facility])     Patient Active Problem List   Diagnosis Date Noted  . Migraine 11/05/2017  . RAD (reactive airway disease), moderate persistent, uncomplicated 23/76/2831  . Acute cystitis without hematuria   . Change in mental status 10/26/2015  . Nonepileptic episode ([place]) 10/26/2015  . Adult BMI 30+ 10/18/2015  . Atopic rhinitis 10/18/2015  . Reactive airway disease without complication 51/76/1607  . Recurrent major depressive disorder, in partial remission ([place]) 10/18/2015  . Snoring 10/18/2015  . Other epilepsy, intractable, without status epilepticus ([place]) 10/04/2015  . Spells of speech arrest 08/10/2015  . Shaking spells 08/10/2015  . Apneic spell 08/10/2015  . Primary osteoarthritis involving multiple joints 07/25/2015  . Hot flashes 07/25/2015  . Chronic pain of both shoulders 06/26/2015  . Spondylosis of cervical region  without myelopathy or radiculopathy 06/26/2015  . Pseudoseizure   . Status epilepticus due to refractory epilepsy ([place]) 06/22/2015  . Chronic nonintractable headache 06/04/2015  . Blurry vision, right eye 05/18/2015  . Depression, major, single episode, complete remission ([place]) 05/18/2015  . Renal calculus 05/18/2015  . Pseudoseizures 05/11/2015  . Migraines 05/11/2015  . Abdominal pain 05/10/2015  . Allergy to bee sting 12/01/2014  . Pain in toe of left foot 12/01/2014  . Nausea 12/01/2014  . Gastroesophageal reflux disease without esophagitis 12/01/2014  . Difficulty breathing 10/19/2014  . Seizures ([place]) 05/17/2014  . Respiratory failure, acute ([place]) 05/16/2014  . Depression, unspecified depression type 05/14/2014  . Headache disorder 04/13/2014  . Spells 04/13/2014  . Anxiety 01/07/2014  . Depression 01/07/2014  . Acute headache 12/02/2013  . Rotator cuff (capsule) sprain and strain 11/27/2013  . Midline low back pain without sciatica 10/29/2013  . Localized swelling, mass and lump, neck 10/29/2013  . Seizure disorder ([place]) 10/29/2013  . COPD (chronic obstructive pulmonary disease) ([place])   . Emphysema/COPD ([place])   . Sleep apnea   . Stroke ([place])   . Pain, chronic 07/11/2013    Past Surgical History:  Procedure Laterality Date  . ABDOMINAL HYSTERECTOMY     partial  . CESAREAN SECTION  1993  . PARTIAL HYSTERECTOMY  1995  . TONSILLECTOMY      Prior to Admission medications   Medication Sig Start Date End Date Taking? Authorizing Provider  acetaminophen (TYLENOL) 500 MG tablet  Take 1,000 mg by mouth every 6 (six) hours as needed for mild pain or headache.    [provider]  albuterol (PROVENTIL HFA;VENTOLIN HFA) 108 (90 BASE) MCG/ACT inhaler Inhale 2 puffs into the lungs every 6 (six) hours as needed for wheezing or shortness of breath.     [provider]  aspirin EC 81 MG tablet Take 81 mg by mouth daily.    [provider]  b complex  vitamins capsule Take 1 capsule by mouth daily. 11/05/17   Hillary Bow, MD  cloBAZam (ONFI) 10 MG tablet Take 1 tablet (10 mg total) by mouth at bedtime. 12/05/17   Earleen Newport, MD  dicyclomine (BENTYL) 10 MG capsule Take 10 mg by mouth. Reported on 09/09/2015 07/14/15 07/13/16  [provider]  FLOVENT HFA 110 MCG/ACT inhaler Inhale 1 puff into the lungs 2 (two) times daily. 10/18/15   [provider]  Magnesium 500 MG CAPS Take 1 capsule (500 mg total) by mouth daily. 11/05/17   Hillary Bow, MD  meloxicam (MOBIC) 7.5 MG tablet Take 7.5-15 mg by mouth daily as needed for pain.     [provider]  Multiple Vitamin (MULTIVITAMIN) tablet Take 1 tablet by mouth daily.    [provider]  NON FORMULARY Place 5 drops under the tongue 2 (two) times daily. CBD OIL    [provider]  pantoprazole (PROTONIX) 40 MG tablet Take 40 mg by mouth daily. Reported on 08/03/2015    [provider]  topiramate (TOPAMAX) 50 MG tablet Take 1 tablet in the morning, 2 tablets at night until 8/29. Take 2 tablets in the morning and night thereafter. Patient taking differently: Take 50 mg by mouth 2 (two) times daily. Take 1 tablet in the morning, 2 tablets at night until 8/29. Take 2 tablets in the morning and night thereafter. 10/28/15   Riccardo Dubin, MD  triamcinolone (KENALOG) 0.025 % ointment Apply 1 application topically 2 (two) times daily. 10/26/16   Laban Emperor, PA-C    Allergies Bee venom; Shellfish allergy; Contrast media [iodinated diagnostic agents]; Other; Penicillins; and Sulfa antibiotics  Family History  Problem Relation Age of Onset  . Depression Maternal Grandmother   . Hypertension Maternal Grandmother   . Cancer Maternal Grandfather   . Lung cancer Mother   . Depression Mother   . Learning disabilities Sister   . Depression Sister   . Depression Paternal Uncle   . Hypertension Paternal Uncle   . Diabetes Neg Hx   . Breast cancer  Neg Hx     Social History Social History   Tobacco Use  . Smoking status: Current Every Day Smoker    Types: E-cigarettes  . Smokeless tobacco: Never Used  Substance Use Topics  . Alcohol use: No  . Drug use: No    Review of Systems Constitutional: No fever/chills Eyes: No visual changes. ENT: No sore throat. Cardiovascular: Denies chest pain. Respiratory: Denies shortness of breath. Gastrointestinal: No abdominal pain.  No nausea, no vomiting.  No diarrhea.   Genitourinary: Negative for dysuria. Musculoskeletal: Positive for neck pain. Skin: Negative for rash. Neurological: Positive for headache. ____________________________________________   PHYSICAL EXAM:  VITAL SIGNS: ED Triage Vitals  Enc Vitals Group     BP 12/06/17 1421 117/74     Pulse Rate 12/06/17 1421 (!) 116     Resp 12/06/17 1421 20     Temp 12/06/17 1421 98.7 F (37.1 C)     Temp Source 12/06/17  1421 Oral     SpO2 12/06/17 1421 97 %     Weight 12/06/17 1428 150 lb (68 kg)     Height 12/06/17 1428 5\' 5"  (1.651 m)     Head Circumference --      Peak Flow --      Pain Score 12/06/17 1428 8   Constitutional: Alert and oriented.  Eyes: Conjunctivae are normal.  ENT      Head: Normocephalic. Contusions noted to right cheek and forehead.       Nose: No congestion/rhinnorhea.      Mouth/Throat: Mucous membranes are moist.      Neck: No stridor. Hematological/Lymphatic/Immunilogical: No cervical lymphadenopathy. Cardiovascular: Normal rate, regular rhythm.  No murmurs, rubs, or gallops.  Respiratory: Normal respiratory effort without tachypnea nor retractions. Breath sounds are clear and equal bilaterally. No wheezes/rales/rhonchi. Gastrointestinal: Soft and non tender. No rebound. No guarding.  Genitourinary: Deferred Musculoskeletal: Normal range of motion in all extremities. No lower extremity edema. Neurologic:  Normal speech and language. No gross focal neurologic deficits are appreciated.  Skin:   Skin is warm, dry and intact. No rash noted. Psychiatric: Mood and affect are normal. Speech and behavior are normal. Patient exhibits appropriate insight and judgment.  ____________________________________________    LABS (pertinent positives/negatives)  CBC wnl CMP wnl  ____________________________________________   EKG  None  ____________________________________________    RADIOLOGY  CT head/cervical spine/max face No acute intracranial abnormality. No acute osseous injury. Hematoma.   ____________________________________________   PROCEDURES  Procedures  ____________________________________________   INITIAL IMPRESSION / ASSESSMENT AND PLAN / ED COURSE  Pertinent labs & imaging results that were available during my care of the patient were reviewed by me and considered in my medical decision making (see chart for details).   Patient presented to the emergency department today because of concerns for head injury after apparent seizure-like activity.  CT scans of the head cervical spine max face did not show any concerning injuries.  Discussed this finding with patient.  Patient blood work without concerning electrolyte abnormalities.  Will discharge to follow-up with primary care and neurology.  ___________________________   FINAL CLINICAL IMPRESSION(S) / ED DIAGNOSES  Final diagnoses:  Seizure-like activity ([place])  Hematoma     Note: This dictation was prepared with Dragon dictation. Any transcriptional errors that result from this process are unintentional     Nance Pear, MD 12/06/17 1733

## 2017-12-06 NOTE — ED Notes (Signed)
Unsuccessful attempt at IV. Blood work collected

## 2017-12-06 NOTE — ED Notes (Signed)
Spoke with Kinner over CT head yesterday. Verbal order to d/c Ct at this time and will reevaluate after seeing MD

## 2017-12-07 ENCOUNTER — Other Ambulatory Visit: Payer: Self-pay

## 2017-12-07 NOTE — Patient Outreach (Addendum)
[facility] [facility]) Care Management  12/07/2017  Allice Garro 12/17/1970 [id number]     Transition of Care Referral  Referral Date: 12/07/17 Referral Source: [employer] Discharge Report Date of Admission: 11/04/17 Diagnosis: CVA Date of Discharge: 11/05/17 Facility:[facility] Insurance: [facility]    Referral received. No outreach warranted at this time. TOC will be completed by primary care provider office who will refer to [facility] care mgmt if needed.    Plan: RN CM will close case at this time.    Enzo Montgomery, RN,BSN,CCM [place] Management Telephonic Care Management Coordinator Direct Phone: [phone] Toll Free: [phone] Fax: [phone]

## 2017-12-11 ENCOUNTER — Ambulatory Visit: Payer: Self-pay | Admitting: Pharmacist

## 2017-12-14 ENCOUNTER — Ambulatory Visit: Payer: Self-pay | Admitting: Pharmacist

## 2017-12-14 ENCOUNTER — Other Ambulatory Visit: Payer: Self-pay | Admitting: Pharmacist

## 2017-12-14 NOTE — Patient Outreach (Signed)
[facility]) Care Management  [place]  12/14/2017  [name] [birth year] [id number]  Reason for referral: medication questions  [facility] pharmacy case is being closed due to the following reasons:   Goals have been met. Patient now seeing PCP and Neuro for migraine care.  Patient has been provided [facility] CM contact information if assistance needed in the future.    Thank you for allowing [facility] pharmacy to be involved in this patient's care.      Regina Eck, PharmD, [place]  [phone]

## 2018-01-11 DIAGNOSIS — R748 Abnormal levels of other serum enzymes: Secondary | ICD-10-CM | POA: Diagnosis not present

## 2018-01-11 DIAGNOSIS — N951 Menopausal and female climacteric states: Secondary | ICD-10-CM | POA: Diagnosis not present

## 2018-02-19 DIAGNOSIS — F1510 Other stimulant abuse, uncomplicated: Secondary | ICD-10-CM | POA: Diagnosis not present

## 2018-02-21 DIAGNOSIS — F1510 Other stimulant abuse, uncomplicated: Secondary | ICD-10-CM | POA: Diagnosis not present

## 2018-02-26 DIAGNOSIS — F1510 Other stimulant abuse, uncomplicated: Secondary | ICD-10-CM | POA: Diagnosis not present

## 2018-02-28 DIAGNOSIS — F1510 Other stimulant abuse, uncomplicated: Secondary | ICD-10-CM | POA: Diagnosis not present

## 2018-03-04 DIAGNOSIS — F1510 Other stimulant abuse, uncomplicated: Secondary | ICD-10-CM | POA: Diagnosis not present

## 2018-03-07 DIAGNOSIS — F1510 Other stimulant abuse, uncomplicated: Secondary | ICD-10-CM | POA: Diagnosis not present

## 2018-03-12 DIAGNOSIS — F1510 Other stimulant abuse, uncomplicated: Secondary | ICD-10-CM | POA: Diagnosis not present

## 2018-03-13 DIAGNOSIS — G43119 Migraine with aura, intractable, without status migrainosus: Secondary | ICD-10-CM | POA: Diagnosis not present

## 2018-03-14 DIAGNOSIS — F1510 Other stimulant abuse, uncomplicated: Secondary | ICD-10-CM | POA: Diagnosis not present

## 2018-03-19 DIAGNOSIS — F1510 Other stimulant abuse, uncomplicated: Secondary | ICD-10-CM | POA: Diagnosis not present

## 2018-03-20 DIAGNOSIS — H6501 Acute serous otitis media, right ear: Secondary | ICD-10-CM | POA: Diagnosis not present

## 2018-03-21 DIAGNOSIS — F1510 Other stimulant abuse, uncomplicated: Secondary | ICD-10-CM | POA: Diagnosis not present

## 2018-03-28 DIAGNOSIS — F1510 Other stimulant abuse, uncomplicated: Secondary | ICD-10-CM | POA: Diagnosis not present

## 2018-04-01 ENCOUNTER — Emergency Department: Payer: Medicare HMO | Admitting: Anesthesiology

## 2018-04-01 ENCOUNTER — Encounter: Payer: Self-pay | Admitting: Emergency Medicine

## 2018-04-01 ENCOUNTER — Emergency Department: Payer: Medicare HMO

## 2018-04-01 ENCOUNTER — Observation Stay
Admission: EM | Admit: 2018-04-01 | Discharge: 2018-04-02 | Disposition: A | Discharge status: Home / Self Care | Payer: Medicare HMO | Attending: Surgery | Admitting: Surgery

## 2018-04-01 ENCOUNTER — Encounter
Admission: EM | Disposition: A | Discharge status: Home / Self Care | Payer: Self-pay | Source: Home / Self Care | Attending: Emergency Medicine

## 2018-04-01 ENCOUNTER — Inpatient Hospital Stay: Admit: 2018-04-01 | Payer: Medicare HMO | Admitting: Surgery

## 2018-04-01 ENCOUNTER — Other Ambulatory Visit: Payer: Self-pay

## 2018-04-01 DIAGNOSIS — G4730 Sleep apnea, unspecified: Secondary | ICD-10-CM | POA: Insufficient documentation

## 2018-04-01 DIAGNOSIS — F17290 Nicotine dependence, other tobacco product, uncomplicated: Secondary | ICD-10-CM | POA: Insufficient documentation

## 2018-04-01 DIAGNOSIS — R569 Unspecified convulsions: Secondary | ICD-10-CM | POA: Insufficient documentation

## 2018-04-01 DIAGNOSIS — N200 Calculus of kidney: Secondary | ICD-10-CM | POA: Diagnosis not present

## 2018-04-01 DIAGNOSIS — Z7982 Long term (current) use of aspirin: Secondary | ICD-10-CM | POA: Insufficient documentation

## 2018-04-01 DIAGNOSIS — J439 Emphysema, unspecified: Secondary | ICD-10-CM | POA: Insufficient documentation

## 2018-04-01 DIAGNOSIS — K353 Acute appendicitis with localized peritonitis, without perforation or gangrene: Principal | ICD-10-CM | POA: Insufficient documentation

## 2018-04-01 DIAGNOSIS — F418 Other specified anxiety disorders: Secondary | ICD-10-CM | POA: Diagnosis not present

## 2018-04-01 DIAGNOSIS — K3580 Unspecified acute appendicitis: Secondary | ICD-10-CM | POA: Diagnosis present

## 2018-04-01 DIAGNOSIS — K219 Gastro-esophageal reflux disease without esophagitis: Secondary | ICD-10-CM | POA: Diagnosis not present

## 2018-04-01 DIAGNOSIS — Z79899 Other long term (current) drug therapy: Secondary | ICD-10-CM | POA: Diagnosis not present

## 2018-04-01 DIAGNOSIS — G43909 Migraine, unspecified, not intractable, without status migrainosus: Secondary | ICD-10-CM | POA: Diagnosis not present

## 2018-04-01 DIAGNOSIS — K37 Unspecified appendicitis: Secondary | ICD-10-CM | POA: Diagnosis not present

## 2018-04-01 DIAGNOSIS — Z8673 Personal history of transient ischemic attack (TIA), and cerebral infarction without residual deficits: Secondary | ICD-10-CM | POA: Insufficient documentation

## 2018-04-01 DIAGNOSIS — R109 Unspecified abdominal pain: Secondary | ICD-10-CM | POA: Diagnosis present

## 2018-04-01 HISTORY — PX: LAPAROSCOPIC APPENDECTOMY: SHX408

## 2018-04-01 LAB — CBC
HCT: 40.4 % (ref 36.0–46.0)
Hemoglobin: 13.7 g/dL (ref 12.0–15.0)
MCH: 28.8 pg (ref 26.0–34.0)
MCHC: 33.9 g/dL (ref 30.0–36.0)
MCV: 85.1 fL (ref 80.0–100.0)
Platelets: 309 10*3/uL (ref 150–400)
RBC: 4.75 MIL/uL (ref 3.87–5.11)
RDW: 11.7 % (ref 11.5–15.5)
WBC: 7.9 10*3/uL (ref 4.0–10.5)
nRBC: 0 % (ref 0.0–0.2)

## 2018-04-01 LAB — COMPREHENSIVE METABOLIC PANEL
ALBUMIN: 3.9 g/dL (ref 3.5–5.0)
ALT: 31 U/L (ref 0–44)
AST: 31 U/L (ref 15–41)
Alkaline Phosphatase: 118 U/L (ref 38–126)
Anion gap: 8 (ref 5–15)
BILIRUBIN TOTAL: 0.5 mg/dL (ref 0.3–1.2)
BUN: 10 mg/dL (ref 6–20)
CALCIUM: 9.3 mg/dL (ref 8.9–10.3)
CO2: 24 mmol/L (ref 22–32)
Chloride: 107 mmol/L (ref 98–111)
Creatinine, Ser: 0.61 mg/dL (ref 0.44–1.00)
GFR calc Af Amer: >60 mL/min (ref >60)
GFR calc non Af Amer: >60 mL/min (ref >60)
Glucose, Bld: 108 mg/dL — ABNORMAL HIGH (ref 70–99)
POTASSIUM: 3.3 mmol/L — AB (ref 3.5–5.1)
Sodium: 139 mmol/L (ref 135–145)
TOTAL PROTEIN: 7.2 g/dL (ref 6.5–8.1)

## 2018-04-01 LAB — URINALYSIS, COMPLETE (UACMP) WITH MICROSCOPIC
BILIRUBIN URINE: NEGATIVE
Glucose, UA: NEGATIVE mg/dL
Ketones, ur: NEGATIVE mg/dL
Leukocytes, UA: NEGATIVE
Nitrite: NEGATIVE
Protein, ur: NEGATIVE mg/dL
Specific Gravity, Urine: 1.024 (ref 1.005–1.030)
pH: 5 (ref 5.0–8.0)

## 2018-04-01 LAB — LIPASE, BLOOD: Lipase: 32 U/L (ref 11–51)

## 2018-04-01 SURGERY — APPENDECTOMY, LAPAROSCOPIC
Anesthesia: General

## 2018-04-01 MED ORDER — FENTANYL CITRATE (PF) 100 MCG/2ML IJ SOLN
INTRAMUSCULAR | Status: AC
  Filled 2018-04-01: qty 2

## 2018-04-01 MED ORDER — FENTANYL CITRATE (PF) 100 MCG/2ML IJ SOLN
INTRAMUSCULAR | Status: DC | PRN
  Administered 2018-04-01 (×4): 50 ug via INTRAVENOUS

## 2018-04-01 MED ORDER — BARIUM SULFATE 2.1 % PO SUSP
450.0000 mL | ORAL | Status: DC
  Filled 2018-04-01 (×2): qty 450

## 2018-04-01 MED ORDER — METRONIDAZOLE IN NACL 5-0.79 MG/ML-% IV SOLN
500.0000 mg | Freq: Three times a day (TID) | INTRAVENOUS | Status: AC
  Administered 2018-04-02 (×2): 500 mg via INTRAVENOUS
  Filled 2018-04-01 (×2): qty 100

## 2018-04-01 MED ORDER — ATROPINE SULFATE 0.4 MG/ML IJ SOLN
INTRAMUSCULAR | Status: AC
  Filled 2018-04-01: qty 1

## 2018-04-01 MED ORDER — ONDANSETRON 4 MG PO TBDP: 4.0000 mg | ORAL_TABLET | Freq: Four times a day (QID) | ORAL | Status: DC | PRN

## 2018-04-01 MED ORDER — ENOXAPARIN SODIUM 40 MG/0.4ML ~~LOC~~ SOLN
40.0000 mg | SUBCUTANEOUS | Status: DC
  Administered 2018-04-02: 40 mg via SUBCUTANEOUS
  Filled 2018-04-01: qty 0.4

## 2018-04-01 MED ORDER — SODIUM CHLORIDE 0.9% FLUSH
3.0000 mL | Freq: Once | INTRAVENOUS | Status: AC
  Administered 2018-04-01: 3 mL via INTRAVENOUS

## 2018-04-01 MED ORDER — LIDOCAINE HCL 1 % IJ SOLN
INTRAMUSCULAR | Status: DC | PRN
  Administered 2018-04-01: 20 mL

## 2018-04-01 MED ORDER — DEXAMETHASONE SODIUM PHOSPHATE 10 MG/ML IJ SOLN
INTRAMUSCULAR | Status: AC
  Filled 2018-04-01: qty 1

## 2018-04-01 MED ORDER — EPHEDRINE SULFATE 50 MG/ML IJ SOLN
INTRAMUSCULAR | Status: AC
  Filled 2018-04-01: qty 1

## 2018-04-01 MED ORDER — METRONIDAZOLE IN NACL 5-0.79 MG/ML-% IV SOLN
500.0000 mg | Freq: Three times a day (TID) | INTRAVENOUS | Status: DC
  Administered 2018-04-01: 500 mg via INTRAVENOUS
  Filled 2018-04-01: qty 100

## 2018-04-01 MED ORDER — LACTATED RINGERS IV SOLN
INTRAVENOUS | Status: DC | PRN
  Administered 2018-04-01: 20:00:00 via INTRAVENOUS

## 2018-04-01 MED ORDER — PROPOFOL 10 MG/ML IV BOLUS
INTRAVENOUS | Status: DC | PRN
  Administered 2018-04-01: 200 mg via INTRAVENOUS

## 2018-04-01 MED ORDER — ALBUTEROL SULFATE (2.5 MG/3ML) 0.083% IN NEBU: 2.5000 mg | INHALATION_SOLUTION | Freq: Four times a day (QID) | RESPIRATORY_TRACT | Status: DC | PRN

## 2018-04-01 MED ORDER — MIDAZOLAM HCL 2 MG/2ML IJ SOLN
INTRAMUSCULAR | Status: AC
  Filled 2018-04-01: qty 2

## 2018-04-01 MED ORDER — HYDROMORPHONE HCL 1 MG/ML IJ SOLN
0.5000 mg | INTRAMUSCULAR | Status: DC | PRN
  Administered 2018-04-01 (×2): 0.5 mg via INTRAVENOUS

## 2018-04-01 MED ORDER — OXYCODONE HCL 5 MG PO TABS
5.0000 mg | ORAL_TABLET | ORAL | Status: DC | PRN
  Administered 2018-04-02 (×2): 5 mg via ORAL
  Filled 2018-04-01 (×2): qty 1

## 2018-04-01 MED ORDER — GABAPENTIN 300 MG PO CAPS
300.0000 mg | ORAL_CAPSULE | ORAL | Status: DC
  Filled 2018-04-01: qty 1

## 2018-04-01 MED ORDER — SUCCINYLCHOLINE CHLORIDE 20 MG/ML IJ SOLN
INTRAMUSCULAR | Status: DC | PRN
  Administered 2018-04-01: 120 mg via INTRAVENOUS

## 2018-04-01 MED ORDER — ONDANSETRON HCL 4 MG/2ML IJ SOLN: 4.0000 mg | Freq: Once | INTRAMUSCULAR | Status: DC | PRN

## 2018-04-01 MED ORDER — SODIUM CHLORIDE 0.9 % IV SOLN
2.0000 g | INTRAVENOUS | Status: DC
  Filled 2018-04-01: qty 20

## 2018-04-01 MED ORDER — ACETAMINOPHEN 10 MG/ML IV SOLN
INTRAVENOUS | Status: AC
  Filled 2018-04-01: qty 100

## 2018-04-01 MED ORDER — MORPHINE SULFATE (PF) 2 MG/ML IV SOLN
2.0000 mg | INTRAVENOUS | Status: DC | PRN
  Administered 2018-04-02: 2 mg via INTRAVENOUS
  Filled 2018-04-01: qty 1

## 2018-04-01 MED ORDER — EPHEDRINE SULFATE 50 MG/ML IJ SOLN
INTRAMUSCULAR | Status: DC | PRN
  Administered 2018-04-01: 10 mg via INTRAVENOUS
  Administered 2018-04-01: 5 mg via INTRAVENOUS

## 2018-04-01 MED ORDER — SUGAMMADEX SODIUM 500 MG/5ML IV SOLN
INTRAVENOUS | Status: DC | PRN
  Administered 2018-04-01: 160 mg via INTRAVENOUS

## 2018-04-01 MED ORDER — LACTATED RINGERS IV SOLN
INTRAVENOUS | Status: DC
  Administered 2018-04-01: via INTRAVENOUS

## 2018-04-01 MED ORDER — ONDANSETRON HCL 4 MG/2ML IJ SOLN
4.0000 mg | Freq: Once | INTRAMUSCULAR | Status: AC
  Administered 2018-04-01: 4 mg via INTRAVENOUS
  Filled 2018-04-01: qty 2

## 2018-04-01 MED ORDER — LIDOCAINE HCL (PF) 2 % IJ SOLN
INTRAMUSCULAR | Status: AC
  Filled 2018-04-01: qty 10

## 2018-04-01 MED ORDER — ACETAMINOPHEN 500 MG PO TABS
1000.0000 mg | ORAL_TABLET | Freq: Four times a day (QID) | ORAL | Status: DC
  Administered 2018-04-01 – 2018-04-02 (×3): 1000 mg via ORAL
  Filled 2018-04-01 (×3): qty 2

## 2018-04-01 MED ORDER — LIDOCAINE HCL (CARDIAC) PF 100 MG/5ML IV SOSY
PREFILLED_SYRINGE | INTRAVENOUS | Status: DC | PRN
  Administered 2018-04-01: 100 mg via INTRAVENOUS

## 2018-04-01 MED ORDER — ACETAMINOPHEN 500 MG PO TABS: 1000.0000 mg | ORAL_TABLET | ORAL | Status: DC

## 2018-04-01 MED ORDER — KETOROLAC TROMETHAMINE 30 MG/ML IJ SOLN
30.0000 mg | Freq: Once | INTRAMUSCULAR | Status: AC
  Administered 2018-04-01: 30 mg via INTRAVENOUS
  Filled 2018-04-01: qty 1

## 2018-04-01 MED ORDER — ROCURONIUM BROMIDE 100 MG/10ML IV SOLN
INTRAVENOUS | Status: DC | PRN
  Administered 2018-04-01: 35 mg via INTRAVENOUS
  Administered 2018-04-01: .5 mg via INTRAVENOUS

## 2018-04-01 MED ORDER — PROPOFOL 10 MG/ML IV BOLUS
INTRAVENOUS | Status: AC
  Filled 2018-04-01: qty 20

## 2018-04-01 MED ORDER — PHENYLEPHRINE HCL 10 MG/ML IJ SOLN
INTRAMUSCULAR | Status: AC
  Filled 2018-04-01: qty 1

## 2018-04-01 MED ORDER — ACETAMINOPHEN 10 MG/ML IV SOLN
INTRAVENOUS | Status: DC | PRN
  Administered 2018-04-01: 1000 mg via INTRAVENOUS

## 2018-04-01 MED ORDER — DEXAMETHASONE SODIUM PHOSPHATE 10 MG/ML IJ SOLN
INTRAMUSCULAR | Status: DC | PRN
  Administered 2018-04-01: 10 mg via INTRAVENOUS

## 2018-04-01 MED ORDER — MIDAZOLAM HCL 2 MG/2ML IJ SOLN
INTRAMUSCULAR | Status: DC | PRN
  Administered 2018-04-01: 2 mg via INTRAVENOUS

## 2018-04-01 MED ORDER — CHLORHEXIDINE GLUCONATE CLOTH 2 % EX PADS
6.0000 | MEDICATED_PAD | Freq: Once | CUTANEOUS | Status: DC
  Filled 2018-04-01: qty 6

## 2018-04-01 MED ORDER — ONDANSETRON HCL 4 MG/2ML IJ SOLN
INTRAMUSCULAR | Status: DC | PRN
  Administered 2018-04-01: 4 mg via INTRAVENOUS

## 2018-04-01 MED ORDER — SODIUM CHLORIDE 0.9 % IV SOLN
2.0000 g | INTRAVENOUS | Status: AC
  Administered 2018-04-01: 2 g via INTRAVENOUS
  Filled 2018-04-01: qty 2

## 2018-04-01 MED ORDER — FENTANYL CITRATE (PF) 100 MCG/2ML IJ SOLN
25.0000 ug | INTRAMUSCULAR | Status: AC | PRN
  Administered 2018-04-01 (×6): 25 ug via INTRAVENOUS

## 2018-04-01 MED ORDER — HYDROMORPHONE HCL 1 MG/ML IJ SOLN
INTRAMUSCULAR | Status: AC
  Administered 2018-04-01: 0.5 mg via INTRAVENOUS
  Filled 2018-04-01: qty 1

## 2018-04-01 MED ORDER — KETOROLAC TROMETHAMINE 30 MG/ML IJ SOLN
30.0000 mg | Freq: Four times a day (QID) | INTRAMUSCULAR | Status: DC
  Administered 2018-04-01 – 2018-04-02 (×3): 30 mg via INTRAVENOUS
  Filled 2018-04-01 (×3): qty 1

## 2018-04-01 MED ORDER — AMITRIPTYLINE HCL 50 MG PO TABS
50.0000 mg | ORAL_TABLET | Freq: Every day | ORAL | Status: DC
  Administered 2018-04-01: 50 mg via ORAL
  Filled 2018-04-01 (×2): qty 1

## 2018-04-01 MED ORDER — ONDANSETRON HCL 4 MG/2ML IJ SOLN: 4.0000 mg | Freq: Four times a day (QID) | INTRAMUSCULAR | Status: DC | PRN

## 2018-04-01 SURGICAL SUPPLY — 40 items
BLADE SURG SZ11 CARB STEEL (BLADE) ×2 IMPLANT
CANISTER SUCT 3000ML PPV (MISCELLANEOUS) ×2 IMPLANT
CHLORAPREP W/TINT 26ML (MISCELLANEOUS) ×2 IMPLANT
COVER WAND RF STERILE (DRAPES) ×2 IMPLANT
CUTTER FLEX LINEAR 45M (STAPLE) ×2 IMPLANT
DECANTER SPIKE VIAL GLASS SM (MISCELLANEOUS) ×2 IMPLANT
DERMABOND ADVANCED (GAUZE/BANDAGES/DRESSINGS) ×1
DERMABOND ADVANCED .7 DNX12 (GAUZE/BANDAGES/DRESSINGS) ×1 IMPLANT
ELECT REM PT RETURN 9FT ADLT (ELECTROSURGICAL) ×2
ELECTRODE REM PT RTRN 9FT ADLT (ELECTROSURGICAL) ×1 IMPLANT
GLOVE BIO SURGEON STRL SZ7 (GLOVE) ×2 IMPLANT
GLOVE BIOGEL PI IND STRL 7.5 (GLOVE) ×1 IMPLANT
GLOVE BIOGEL PI INDICATOR 7.5 (GLOVE) ×1
GOWN STRL REUS W/ TWL LRG LVL4 (GOWN DISPOSABLE) ×1 IMPLANT
GOWN STRL REUS W/ TWL XL LVL3 (GOWN DISPOSABLE) ×1 IMPLANT
GOWN STRL REUS W/TWL LRG LVL4 (GOWN DISPOSABLE) ×1
GOWN STRL REUS W/TWL XL LVL3 (GOWN DISPOSABLE) ×1
GRASPER SUT TROCAR 14GX15 (MISCELLANEOUS) ×2 IMPLANT
IRRIGATION STRYKERFLOW (MISCELLANEOUS) IMPLANT
IRRIGATOR STRYKERFLOW (MISCELLANEOUS)
KIT TURNOVER KIT A (KITS) ×2 IMPLANT
NEEDLE HYPO 22GX1.5 SAFETY (NEEDLE) ×2 IMPLANT
NEEDLE INSUFFLATION 14GA 120MM (NEEDLE) ×2 IMPLANT
NS IRRIG 1000ML POUR BTL (IV SOLUTION) ×2 IMPLANT
PACK LAP CHOLECYSTECTOMY (MISCELLANEOUS) ×2 IMPLANT
POUCH SPECIMEN RETRIEVAL 10MM (ENDOMECHANICALS) ×2 IMPLANT
RELOAD 45 VASCULAR/THIN (ENDOMECHANICALS) IMPLANT
RELOAD STAPLE TA45 3.5 REG BLU (ENDOMECHANICALS) ×2 IMPLANT
SET TUBE SMOKE EVAC HIGH FLOW (TUBING) ×2 IMPLANT
SHEARS HARMONIC ACE PLUS 36CM (ENDOMECHANICALS) ×2 IMPLANT
SLEEVE ENDOPATH XCEL 5M (ENDOMECHANICALS) ×2 IMPLANT
SLEEVE SCD COMPRESS THIGH MED (MISCELLANEOUS) ×2 IMPLANT
SOL .9 NS 3000ML IRR  AL (IV SOLUTION)
SOL .9 NS 3000ML IRR UROMATIC (IV SOLUTION) IMPLANT
SUT MNCRL AB 4-0 PS2 18 (SUTURE) ×2 IMPLANT
SUT VICRYL 0 UR6 27IN ABS (SUTURE) ×2 IMPLANT
SUT VICRYL AB 3-0 FS1 BRD 27IN (SUTURE) ×2 IMPLANT
TRAY FOLEY MTR SLVR 16FR STAT (SET/KITS/TRAYS/PACK) ×2 IMPLANT
TROCAR XCEL 12X100 BLDLESS (ENDOMECHANICALS) ×2 IMPLANT
TROCAR XCEL NON-BLD 5MMX100MML (ENDOMECHANICALS) ×2 IMPLANT

## 2018-04-01 NOTE — Transfer of Care (Signed)
Immediate Anesthesia Transfer of Care Note  Patient: [name]  Procedure(s) Performed: APPENDECTOMY LAPAROSCOPIC (N/A )  Patient Location: PACU  Anesthesia Type:General  Level of Consciousness: awake, alert , oriented and patient cooperative  Airway & Oxygen Therapy: Patient Spontanous Breathing and Patient connected to face mask oxygen  Post-op Assessment: Report given to RN and Post -op Vital signs reviewed and stable  Post vital signs: Reviewed and stable  Last Vitals:  Vitals Value Taken Time  BP 96/57 04/01/2018  9:40 PM  Temp    Pulse 101 04/01/2018  9:41 PM  Resp 17 04/01/2018  9:41 PM  SpO2 100 % 04/01/2018  9:41 PM  Vitals shown include unvalidated device data.  Last Pain:  Vitals:   04/01/18 1809  TempSrc:   PainSc: 10-Worst pain ever         Complications: No apparent anesthesia complications

## 2018-04-01 NOTE — ED Triage Notes (Signed)
First Nurse Note;  C/O RLQ abdominal pain x 1 day.  Denies c/o emesis with symptoms, only c/o nausea.  AAOx3.  Skin warm and dry. NAD

## 2018-04-01 NOTE — ED Provider Notes (Signed)
[facility] Emergency Department Provider Note   ____________________________________________    I have reviewed the triage vital signs and the nursing notes.   HISTORY  Chief Complaint Abdominal Pain     HPI [name] is a 48 y.o. female who presents with complaints of abdominal pain.  Patient describes right lower quadrant abdominal pain, initially started as pain around her umbilicus yesterday, has now traveled to her right lower quadrant and is quite severe.  She states that any movement causes pain in the area.  She denies dysuria.  No history of kidney stones.  Positive nausea no vomiting.  No recent travel.  Episode of diarrhea yesterday.  She has had a C-section in the past.  Has not take anything for this.  Past Medical History:  Diagnosis Date  . Anxiety   . Arthritis   . Asthma   . Bilateral shoulder pain   . Chronic neck pain   . Depression   . GERD (gastroesophageal reflux disease)   . Migraines   . Nasal septal perforation   . Seizure ([place])   . Sleep apnea   . Stroke [facility])     Patient Active Problem List   Diagnosis Date Noted  . Migraine 11/05/2017  . RAD (reactive airway disease), moderate persistent, uncomplicated 23/53/6144  . Acute cystitis without hematuria   . Change in mental status 10/26/2015  . Nonepileptic episode ([place]) 10/26/2015  . Adult BMI 30+ 10/18/2015  . Atopic rhinitis 10/18/2015  . Reactive airway disease without complication 31/54/0086  . Recurrent major depressive disorder, in partial remission ([place]) 10/18/2015  . Snoring 10/18/2015  . Other epilepsy, intractable, without status epilepticus ([place]) 10/04/2015  . Spells of speech arrest 08/10/2015  . Shaking spells 08/10/2015  . Apneic spell 08/10/2015  . Primary osteoarthritis involving multiple joints 07/25/2015  . Hot flashes 07/25/2015  . Chronic pain of both shoulders 06/26/2015  . Spondylosis of cervical region without myelopathy or  radiculopathy 06/26/2015  . Pseudoseizure   . Status epilepticus due to refractory epilepsy ([place]) 06/22/2015  . Chronic nonintractable headache 06/04/2015  . Blurry vision, right eye 05/18/2015  . Depression, major, single episode, complete remission ([place]) 05/18/2015  . Renal calculus 05/18/2015  . Pseudoseizures 05/11/2015  . Migraines 05/11/2015  . Abdominal pain 05/10/2015  . Allergy to bee sting 12/01/2014  . Pain in toe of left foot 12/01/2014  . Nausea 12/01/2014  . Gastroesophageal reflux disease without esophagitis 12/01/2014  . Difficulty breathing 10/19/2014  . Seizures ([place]) 05/17/2014  . Respiratory failure, acute ([place]) 05/16/2014  . Depression, unspecified depression type 05/14/2014  . Headache disorder 04/13/2014  . Spells 04/13/2014  . Anxiety 01/07/2014  . Depression 01/07/2014  . Acute headache 12/02/2013  . Rotator cuff (capsule) sprain and strain 11/27/2013  . Midline low back pain without sciatica 10/29/2013  . Localized swelling, mass and lump, neck 10/29/2013  . Seizure disorder ([place]) 10/29/2013  . COPD (chronic obstructive pulmonary disease) ([place])   . Emphysema/COPD ([place])   . Sleep apnea   . Stroke ([place])   . Pain, chronic 07/11/2013    Past Surgical History:  Procedure Laterality Date  . ABDOMINAL HYSTERECTOMY     partial  . CESAREAN SECTION  1993  . PARTIAL HYSTERECTOMY  1995  . TONSILLECTOMY      Prior to Admission medications   Medication Sig Start Date End Date Taking? Authorizing Provider  acetaminophen (TYLENOL) 500 MG tablet Take 1,000 mg by mouth every 6 (six) hours  as needed for mild pain or headache.    [provider]  albuterol (PROVENTIL HFA;VENTOLIN HFA) 108 (90 BASE) MCG/ACT inhaler Inhale 2 puffs into the lungs every 6 (six) hours as needed for wheezing or shortness of breath.     [provider]  aspirin EC 81 MG tablet Take 81 mg by mouth daily.    [provider]  b complex vitamins capsule Take 1  capsule by mouth daily. 11/05/17   Hillary Bow, MD  cloBAZam (ONFI) 10 MG tablet Take 1 tablet (10 mg total) by mouth at bedtime. 12/05/17   Earleen Newport, MD  dicyclomine (BENTYL) 10 MG capsule Take 10 mg by mouth. Reported on 09/09/2015 07/14/15 07/13/16  [provider]  FLOVENT HFA 110 MCG/ACT inhaler Inhale 1 puff into the lungs 2 (two) times daily. 10/18/15   [provider]  Magnesium 500 MG CAPS Take 1 capsule (500 mg total) by mouth daily. 11/05/17   Hillary Bow, MD  meloxicam (MOBIC) 7.5 MG tablet Take 7.5-15 mg by mouth daily as needed for pain.     [provider]  Multiple Vitamin (MULTIVITAMIN) tablet Take 1 tablet by mouth daily.    [provider]  NON FORMULARY Place 5 drops under the tongue 2 (two) times daily. CBD OIL    [provider]  pantoprazole (PROTONIX) 40 MG tablet Take 40 mg by mouth daily. Reported on 08/03/2015    [provider]  topiramate (TOPAMAX) 50 MG tablet Take 1 tablet in the morning, 2 tablets at night until 8/29. Take 2 tablets in the morning and night thereafter. Patient taking differently: Take 50 mg by mouth 2 (two) times daily. Take 1 tablet in the morning, 2 tablets at night until 8/29. Take 2 tablets in the morning and night thereafter. 10/28/15   Riccardo Dubin, MD  triamcinolone (KENALOG) 0.025 % ointment Apply 1 application topically 2 (two) times daily. 10/26/16   Laban Emperor, PA-C     Allergies Bee venom; Shellfish allergy; Contrast media [iodinated diagnostic agents]; Other; Penicillins; and Sulfa antibiotics  Family History  Problem Relation Age of Onset  . Depression Maternal Grandmother   . Hypertension Maternal Grandmother   . Cancer Maternal Grandfather   . Lung cancer Mother   . Depression Mother   . Learning disabilities Sister   . Depression Sister   . Depression Paternal Uncle   . Hypertension Paternal Uncle   . Diabetes Neg Hx   . Breast cancer Neg Hx     Social  History Social History   Tobacco Use  . Smoking status: Current Every Day Smoker    Types: E-cigarettes  . Smokeless tobacco: Never Used  Substance Use Topics  . Alcohol use: No  . Drug use: No    Review of Systems  Constitutional: No fever/chills Eyes: No visual changes.  ENT: No sore throat. Cardiovascular: Denies chest pain. Respiratory: Denies shortness of breath. Gastrointestinal: As above Genitourinary: Negative for dysuria.  No hematuria reported Musculoskeletal: Negative for back pain. Skin: Negative for rash. Neurological: Negative for headaches or weakness   ____________________________________________   PHYSICAL EXAM:  VITAL SIGNS: ED Triage Vitals  Enc Vitals Group     BP 04/01/18 1412 (!) 147/107     Pulse Rate 04/01/18 1412 (!) 116     Resp 04/01/18 1412 18     Temp 04/01/18 1412 98.4 F (36.9 C)     Temp Source 04/01/18 1412 Oral     SpO2 04/01/18  1412 97 %     Weight 04/01/18 1413 77.1 kg (170 lb)     Height 04/01/18 1413 1.6 m (5\' 3" )     Head Circumference --      Peak Flow --      Pain Score 04/01/18 1413 10     Pain Loc --      Pain Edu? --      Excl. in [place]? --     Constitutional: Alert and oriented.   Nose: No congestion/rhinnorhea. Mouth/Throat: Mucous membranes are moist.    Cardiovascular: Normal rate, regular rhythm. Grossly normal heart sounds.  Good peripheral circulation. Respiratory: Normal respiratory effort.  No retractions. Lungs CTAB. Gastrointestinal: Significant tenderness in the right lower quadrant, no distention, no CVA tenderness  Musculoskeletal:  Warm and well perfused Neurologic:  Normal speech and language. No gross focal neurologic deficits are appreciated.  Skin:  Skin is warm, dry and intact. No rash noted. Psychiatric: Mood and affect are normal. Speech and behavior are normal.  ____________________________________________   LABS (all labs ordered are listed, but only abnormal results are  displayed)  Labs Reviewed  COMPREHENSIVE METABOLIC PANEL - Abnormal; Notable for the following components:      Result Value   Potassium 3.3 (*)    Glucose, Bld 108 (*)    All other components within normal limits  URINALYSIS, COMPLETE (UACMP) WITH MICROSCOPIC - Abnormal; Notable for the following components:   Color, Urine YELLOW (*)    APPearance CLEAR (*)    Hgb urine dipstick SMALL (*)    Bacteria, UA FEW (*)    All other components within normal limits  LIPASE, BLOOD  CBC   ____________________________________________  EKG  None ____________________________________________  RADIOLOGY  CT abdomen pelvis ____________________________________________   PROCEDURES  Procedure(s) performed: No  Procedures   Critical Care performed: No ____________________________________________   INITIAL IMPRESSION / ASSESSMENT AND PLAN / ED COURSE  Pertinent labs & imaging results that were available during my care of the patient were reviewed by me and considered in my medical decision making (see chart for details).  Patient presents with right lower quadrant pain, differential includes ureterolithiasis, appendicitis, less likely diverticulitis versus viral illness.  Lab work is overall reassuring however given tenderness and concern for appendicitis patient sent for CT, she has a history of IV contrast allergy so Noncon study ordered  CT scan demonstrates inflammation around the tip of the appendix although no appendiceal swelling, discussed Dr. Rosana Hoes of surgery who will consult on the patient    ____________________________________________   FINAL CLINICAL IMPRESSION(S) / ED DIAGNOSES  Final diagnoses:  Acute appendicitis, unspecified acute appendicitis type        Note:  This document was prepared using Dragon voice recognition software and may include unintentional dictation errors.   Lavonia Drafts, MD 04/01/18 [phone]

## 2018-04-01 NOTE — H&P (Signed)
SURGICAL ADMISSION HISTORY & PHYSICAL  HISTORY OF PRESENT ILLNESS (HPI):  48 y.o. female presented to [facility] ED today for evaluation of acute onset severe RLQ abdominal pain. Patient reports she first began to experience peri-umbilical abdominal pain yesterday, at which time she thought her pain was that of an ovarian cyst she's experienced previously, but when it this morning shifted to her RLQ, she acknowledged that this was different than the pain she's experienced previously. When her pain did not improve, she presented this evening to [facility] ED accordingly. She recently also experienced ~48 hours diarrhea and last ate at 11 am this morning, denies fever/chills, feels nauseous without emesis, and denies CP. She says she occasionally experiences SOB, though is able to walk >1 - 2 blocks and up/down flights of steps without CP or SOB. She denies any prior similar symptoms.  Surgery is consulted by ED physician Dr. Corky Downs in this context for evaluation and management of acute appendicitis.  PAST MEDICAL HISTORY (PMH):  Past Medical History:  Diagnosis Date  . Anxiety   . Arthritis   . Asthma   . Bilateral shoulder pain   . Chronic neck pain   . Depression   . GERD (gastroesophageal reflux disease)   . Migraines   . Nasal septal perforation   . Seizure ([place])   . Sleep apnea   . Stroke [facility])     PAST SURGICAL HISTORY ([place]):  Past Surgical History:  Procedure Laterality Date  . ABDOMINAL HYSTERECTOMY     partial  . CESAREAN SECTION  1993  . PARTIAL HYSTERECTOMY  1995  . TONSILLECTOMY      MEDICATIONS:  Prior to Admission medications   Medication Sig Start Date End Date Taking? Authorizing Provider  acetaminophen (TYLENOL) 500 MG tablet Take 1,000 mg by mouth every 6 (six) hours as needed for mild pain or headache.   Yes [provider]  albuterol (PROVENTIL HFA;VENTOLIN HFA) 108 (90 BASE) MCG/ACT inhaler Inhale 2 puffs into the lungs every 6 (six) hours as needed for wheezing  or shortness of breath.    Yes [provider]  amitriptyline (ELAVIL) 10 MG tablet Take 50 mg by mouth at bedtime. 11/30/17  Yes [provider]  b complex vitamins capsule Take 1 capsule by mouth daily. 11/05/17  Yes Sudini, Alveta Heimlich, MD  magnesium oxide (MAG-OX) 400 MG tablet Take 400 mg by mouth daily.   Yes [provider]  PARoxetine (PAXIL) 10 MG tablet Take 10 mg by mouth daily.   Yes [provider]  EPINEPHrine 0.3 mg/0.3 mL IJ SOAJ injection Inject into the muscle.    [provider]    ALLERGIES:  Allergies  Allergen Reactions  . Bee Venom Anaphylaxis  . Shellfish Allergy Anaphylaxis  . Contrast Media [Iodinated Diagnostic Agents] Other (See Comments)    Unknown  Reaction:  Unknown   . Other Other (See Comments)    Unknown   . Penicillins Other (See Comments)    Reaction:  Seizures  Has patient had a PCN reaction causing immediate rash, facial/tongue/throat swelling, SOB or lightheadedness with hypotension: No Has patient had a PCN reaction causing severe rash involving mucus membranes or skin necrosis: No Has patient had a PCN reaction that required hospitalization Yes Has patient had a PCN reaction occurring within the last 10 years: Yes If all of the above answers are "NO", then may proceed with Cephalosporin use.  . Sulfa Antibiotics Itching    SOCIAL HISTORY:  Social History   Socioeconomic  History  . Marital status: Married    Spouse name: Not on file  . Number of children: Not on file  . Years of education: Not on file  . Highest education level: Not on file  Occupational History  . Not on file  Social Needs  . Financial resource strain: Patient refused  . Food insecurity:    Worry: Patient refused    Inability: Patient refused  . Transportation needs:    Medical: Patient refused    Non-medical: Patient refused  Tobacco Use  . Smoking status: Current Every Day Smoker    Types: E-cigarettes  . Smokeless tobacco:  Never Used  Substance and Sexual Activity  . Alcohol use: No  . Drug use: No  . Sexual activity: Yes    Partners: Male    Birth control/protection: Surgical  Lifestyle  . Physical activity:    Days per week: Patient refused    Minutes per session: Patient refused  . Stress: Patient refused  Relationships  . Social connections:    Talks on phone: Patient refused    Gets together: Patient refused    Attends religious service: Patient refused    Active member of club or organization: Patient refused    Attends meetings of clubs or organizations: Patient refused    Relationship status: Patient refused  . Intimate partner violence:    Fear of current or ex partner: Patient refused    Emotionally abused: Patient refused    Physically abused: Patient refused    Forced sexual activity: Patient refused  Other Topics Concern  . Not on file  Social History Narrative   Patient is married and lives at home with her husband.              The patient currently resides (home / rehab facility / nursing home): Home The patient normally is (ambulatory / bedbound): Ambulatory   FAMILY HISTORY:  Family History  Problem Relation Age of Onset  . Depression Maternal Grandmother   . Hypertension Maternal Grandmother   . Cancer Maternal Grandfather   . Lung cancer Mother   . Depression Mother   . Learning disabilities Sister   . Depression Sister   . Depression Paternal Uncle   . Hypertension Paternal Uncle   . Diabetes Neg Hx   . Breast cancer Neg Hx     REVIEW OF SYSTEMS:  Constitutional: denies weight loss, fever, chills, or sweats  Eyes: denies any other vision changes, history of eye injury  ENT: denies sore throat, hearing problems  Respiratory: denies shortness of breath, wheezing  Cardiovascular: denies chest pain, palpitations  Gastrointestinal: abdominal pain, N/V, and bowel function as per HPI Genitourinary: denies burning with urination or urinary  frequency Musculoskeletal: denies any other joint pains or cramps  Skin: denies any other rashes or skin discolorations  Neurological: denies any other headache, dizziness, weakness  Psychiatric: denies any other depression, anxiety   All other review of systems were negative   VITAL SIGNS:  Temp:  [98.4 F (36.9 C)] 98.4 F (36.9 C) (01/27 1412) Pulse Rate:  [116] 116 (01/27 1412) Resp:  [18] 18 (01/27 1412) BP: (147)/(107) 147/107 (01/27 1412) SpO2:  [97 %] 97 % (01/27 1412) Weight:  [77.1 kg] 77.1 kg (01/27 1413)     Height: 5\' 3"  (160 cm) Weight: 77.1 kg BMI (Calculated): 30.12   INTAKE/OUTPUT:  This shift: No intake/output data recorded.  Last 2 shifts: @IOLAST2SHIFTS @   PHYSICAL EXAM:  Constitutional:  -- Overweight body habitus  --  Awake, alert, and oriented x3, no apparent distress Eyes:  -- Pupils equally round and reactive to light  -- No scleral icterus, B/L no occular discharge Ear, nose, throat: -- Neck is FROM WNL -- No jugular venous distension  Pulmonary:  -- No wheezes or rhales -- Equal breath sounds bilaterally -- Breathing non-labored at rest Cardiovascular:  -- S1, S2 present  -- No pericardial rubs  Gastrointestinal:  -- Abdomen soft and non-distended with focal RLQ abdominal tenderness to palpation, no guarding or rebound tenderness -- No abdominal masses appreciated, pulsatile or otherwise  Musculoskeletal and Integumentary:  -- Wounds or skin discoloration: None appreciated -- Extremities: B/L UE and LE FROM, hands and feet warm, no edema  Neurologic:  -- Motor function: Intact and symmetric -- Sensation: Intact and symmetric Psychiatric:  -- Mood and affect WNL  Labs:  CBC Latest Ref Rng & Units 04/01/2018 12/06/2017 12/05/2017  WBC 4.0 - 10.5 K/uL 7.9 8.7 7.6  Hemoglobin 12.0 - 15.0 g/dL 13.7 13.9 14.0  Hematocrit 36.0 - 46.0 % 40.4 39.4 39.7  Platelets 150 - 400 K/uL 309 302 294   CMP Latest Ref Rng & Units 04/01/2018 12/06/2017  12/05/2017  Glucose 70 - 99 mg/dL 108(H) 97 114(H)  BUN 6 - 20 mg/dL 10 9 8   Creatinine 0.44 - 1.00 mg/dL 0.61 0.65 0.79  Sodium 135 - 145 mmol/L 139 138 139  Potassium 3.5 - 5.1 mmol/L 3.3(L) 3.8 3.9  Chloride 98 - 111 mmol/L 107 106 104  CO2 22 - 32 mmol/L 24 22 26   Calcium 8.9 - 10.3 mg/dL 9.3 9.2 9.4  Total Protein 6.5 - 8.1 g/dL 7.2 7.5 7.5  Total Bilirubin 0.3 - 1.2 mg/dL 0.5 0.7 0.6  Alkaline Phos 38 - 126 U/L 118 109 110  AST 15 - 41 U/L 31 33 30  ALT 0 - 44 U/L 31 25 25    Imaging studies:  CT Abdomen and Pelvis without Contrast (04/01/2018) - personally reviewed and discussed with patient Stomach/Bowel: Unremarkable stomach. Mild haziness within the small bowel mesentery with multiple small lymph nodes, relatively unchanged in number and conspicuity from the prior CT. No transition point. No focal wall thickening.  The appendix is not enlarged. There is hyperdense material just beyond the base of the appendix within the lumen. No focal fluid. There is trace inflammatory change extending from the tip of the appendix into the retro colic fascial plane.  Vascular/Lymphatic: No significant atherosclerotic changes.  Small lymph nodes within the small bowel mesentery and associated haziness of the fat. These changes were present dating to the remote CT of 11/24/2014, relatively unchanged over time.  Reproductive: Hysterectomy Other: Fat containing umbilical hernia  Assessment/Plan: (ICD-10's: K33.80) 48 y.o. female with clinical evidence and radiographic findings consistent with early acute appendicitis, complicated by pertinent comorbidities including asthma, OSA, history of stroke, GERD, osteoarthritis, chronic ongoing tobacco and marijuana abuse (smoking), migraine headaches, chronic neck pain, generalized anxiety disorder, and major depression disorder.   - pain control prn   - NPO for now with IV fluids  - IV antibiotics (ceftriaxone and metronidazole)  - all  risks, benefits, and alternatives to appendectomy (including observation and antibiotics) were discussed with the patient, all of her questions were answered to her expressed satisfaction, patient expresses she wishes to proceed, and informed consent was obtained.   - will plan to proceed with laparoscopic appendectomy pending anesthesia and OR availability  - DVT prophylaxis, ambulation encouraged  All of the above findings and recommendations were  discussed with the patient, and all of patient's questions were answered to her expressed satisfaction.  -- [name], MD, [place]: [facility] - Partnering for exceptional care. Office: [phone]

## 2018-04-01 NOTE — Op Note (Signed)
SURGICAL OPERATIVE REPORT  DATE OF PROCEDURE: 04/01/2018  ATTENDING Surgeon(s): Vickie Epley, MD  ANESTHESIA: GETA (General)  PRE-OPERATIVE DIAGNOSIS: Acute non-perforated appendicitis with localized peritonitis (K35.30)  POST-OPERATIVE DIAGNOSIS: Acute non-perforated appendicitis with localized peritonitis (K35.30)  PROCEDURE(S):  1.) Laparoscopic appendectomy (cpt: 18841)  INTRAOPERATIVE FINDINGS: Mildly inflamed appendiceal tip without surrounding ascites  INTRAVENOUS FLUIDS: 800 mL crystalloid   ESTIMATED BLOOD LOSS: Minimal (<20 mL)  SPECIMENS: Appendix  IMPLANTS: None  DRAINS: None  COMPLICATIONS: None apparent  CONDITION AT END OF PROCEDURE: Hemodynamically stable and extubated  DISPOSITION OF PATIENT: PACU  INDICATIONS FOR PROCEDURE:  Patient is a 48 y.o. female who presented with acute onset of peri-umbilical abdominal pain that progressed to become greatest at the Right lower quadrant. Patient denied any nausea, vomiting, fever/chills, CP, or SOB and reported the pain has been somewhat well-controlled in the Emergency Department. All risks, benefits, and alternatives to appendectomy were discussed with the patient, all of patient's questions were answered to her expressed satisfaction, and informed consent was obtained and documented.  DETAILS OF PROCEDURE: Patient was brought to the operating suite and appropriately identified. General anesthesia was administered along with confirmation of appropriate pre-operative antibiotics, and endotracheal intubation was performed by anesthetist. In supine position, operative site was prepped and draped in usual sterile fashion, and following a brief time out, initial 5 mm incision was made in a natural skin crease just above the umbilicus. Fascia was then elevated, and a Verress needle was inserted and its proper position confirmed using saline meniscus test prior to abdominal insufflation.  Upon insufflation of the  abdominal cavity with carbon dioxide to a well-tolerated pressure of 12-15 mmHg, a 5 mm peri-umbilical port followed by laparoscope were inserted and used to inspect the abdominal cavity and its contents with no injuries from insertion of the first trochar noted. Two additional trocars were inserted, a 12 mm port at the Left lower quadrant position and another 5 mm port at the suprapubic position. The table was then placed in Trendelenburg position with the Right side up, and blunt graspers were gently used to retract the bowel overlying a mildly inflamed appendix at its appendiceal tip without peri-appendiceal inflammation or ascites. The appendix was gently retracted by near its tip, and the base of the appendix and mesoappendix were identified in relation to the cecum. The mesoappendix was dissected from the visceral appendix and hemostasis achieved using a harmonic scalpel. Upon freeing the visceral appendix from the mesoappendix, an endostapler loaded with a standard blue tissue load was advanced across the base of the visceral appendix, which was compressed for several seconds, and the stapler was deployed and removed from the abdominal cavity. Hemostasis was confirmed, and the specimen was extracted from the abdominal cavity in a laparoscopic specimen bag.  The intraperitoneal cavity was inspected with no additional findings. PMI laparoscopic fascial closure device was then used to re-approximate fascia at the 12 mm Left lower quadrant port site. All ports were then removed under direct visualization, and the abdominal cavity was desuflated. All port sites were irrigated/cleaned, additional local anesthetic was injected at each incision, 3-0 Vicryl was used to re-approximate dermis at 12 mm port site(s), and subcuticular 4-0 Monocryl suture was used to re-approximate skin. Skin was then cleaned, dried, and sterile skin glue was applied. Patient was then safely able to be awakened, extubated, and transferred  to PACU for post-operative monitoring and care.   I was present for all aspects of the above procedure, and no operative  complications were apparent.

## 2018-04-01 NOTE — Anesthesia Preprocedure Evaluation (Addendum)
Anesthesia Evaluation  Patient identified by MRN, date of birth, ID band Patient awake    Reviewed: Allergy & Precautions, NPO status , Patient's Chart, lab work & pertinent test results  History of Anesthesia Complications Negative for: history of anesthetic complications  Airway Mallampati: III     Mouth opening: Limited Mouth Opening  Dental   Pulmonary asthma , sleep apnea , COPD,  COPD inhaler, Current Smoker,           Cardiovascular (-) hypertension(-) Past MI and (-) CHF (-) dysrhythmias (-) Valvular Problems/Murmurs     Neuro/Psych Seizures - (none x 1 year), Well Controlled,  Anxiety Depression CVA (R sided weakness, told it was from Birth control pills), No Residual Symptoms    GI/Hepatic Neg liver ROS, GERD  Medicated,  Endo/Other  neg diabetes  Renal/GU Renal disease (stones)     Musculoskeletal   Abdominal   Peds  Hematology   Anesthesia Other Findings   Reproductive/Obstetrics                            Anesthesia Physical Anesthesia Plan  ASA: III and emergent  Anesthesia Plan: General   Post-op Pain Management:    Induction: Intravenous  PONV Risk Score and Plan:   Airway Management Planned: Oral ETT  Additional Equipment:   Intra-op Plan:   Post-operative Plan:   Informed Consent: I have reviewed the patients History and Physical, chart, labs and discussed the procedure including the risks, benefits and alternatives for the proposed anesthesia with the patient or authorized representative who has indicated his/her understanding and acceptance.       Plan Discussed with:   Anesthesia Plan Comments:         Anesthesia Quick Evaluation

## 2018-04-01 NOTE — Consult Note (Signed)
SURGICAL CONSULTATION NOTE (initial) - cpt: [id number]  HISTORY OF PRESENT ILLNESS (HPI):  48 y.o. female presented to [facility] ED today for evaluation of acute onset severe RLQ abdominal pain. Patient reports she first began to experience peri-umbilical abdominal pain yesterday, at which time she thought her pain was that of an ovarian cyst she's experienced previously, but when it this morning shifted to her RLQ, she acknowledged that this was different than the pain she's experienced previously. When her pain did not improve, she presented this evening to [facility] ED accordingly. She recently also experienced ~48 hours diarrhea and last ate at 11 am this morning, denies fever/chills, feels nauseous without emesis, and denies CP. She says she occasionally experiences SOB, though is able to walk >1 - 2 blocks and up/down flights of steps without CP or SOB. She denies any prior similar symptoms.  Surgery is consulted by ED physician Dr. Corky Downs in this context for evaluation and management of acute appendicitis.  PAST MEDICAL HISTORY (PMH):  Past Medical History:  Diagnosis Date  . Anxiety   . Arthritis   . Asthma   . Bilateral shoulder pain   . Chronic neck pain   . Depression   . GERD (gastroesophageal reflux disease)   . Migraines   . Nasal septal perforation   . Seizure ([place])   . Sleep apnea   . Stroke [facility])     PAST SURGICAL HISTORY ([place]):  Past Surgical History:  Procedure Laterality Date  . ABDOMINAL HYSTERECTOMY     partial  . CESAREAN SECTION  1993  . PARTIAL HYSTERECTOMY  1995  . TONSILLECTOMY      MEDICATIONS:  Prior to Admission medications   Medication Sig Start Date End Date Taking? Authorizing Provider  acetaminophen (TYLENOL) 500 MG tablet Take 1,000 mg by mouth every 6 (six) hours as needed for mild pain or headache.   Yes [provider]  albuterol (PROVENTIL HFA;VENTOLIN HFA) 108 (90 BASE) MCG/ACT inhaler Inhale 2 puffs into the lungs every 6 (six) hours as needed  for wheezing or shortness of breath.    Yes [provider]  amitriptyline (ELAVIL) 10 MG tablet Take 50 mg by mouth at bedtime. 11/30/17  Yes [provider]  b complex vitamins capsule Take 1 capsule by mouth daily. 11/05/17  Yes Sudini, Alveta Heimlich, MD  magnesium oxide (MAG-OX) 400 MG tablet Take 400 mg by mouth daily.   Yes [provider]  PARoxetine (PAXIL) 10 MG tablet Take 10 mg by mouth daily.   Yes [provider]  EPINEPHrine 0.3 mg/0.3 mL IJ SOAJ injection Inject into the muscle.    [provider]    ALLERGIES:  Allergies  Allergen Reactions  . Bee Venom Anaphylaxis  . Shellfish Allergy Anaphylaxis  . Contrast Media [Iodinated Diagnostic Agents] Other (See Comments)    Unknown  Reaction:  Unknown   . Other Other (See Comments)    Unknown   . Penicillins Other (See Comments)    Reaction:  Seizures  Has patient had a PCN reaction causing immediate rash, facial/tongue/throat swelling, SOB or lightheadedness with hypotension: No Has patient had a PCN reaction causing severe rash involving mucus membranes or skin necrosis: No Has patient had a PCN reaction that required hospitalization Yes Has patient had a PCN reaction occurring within the last 10 years: Yes If all of the above answers are "NO", then may proceed with Cephalosporin use.  . Sulfa Antibiotics Itching    SOCIAL HISTORY:  Social History  Socioeconomic History  . Marital status: Married    Spouse name: Not on file  . Number of children: Not on file  . Years of education: Not on file  . Highest education level: Not on file  Occupational History  . Not on file  Social Needs  . Financial resource strain: Patient refused  . Food insecurity:    Worry: Patient refused    Inability: Patient refused  . Transportation needs:    Medical: Patient refused    Non-medical: Patient refused  Tobacco Use  . Smoking status: Current Every Day Smoker    Types: E-cigarettes  .  Smokeless tobacco: Never Used  Substance and Sexual Activity  . Alcohol use: No  . Drug use: No  . Sexual activity: Yes    Partners: Male    Birth control/protection: Surgical  Lifestyle  . Physical activity:    Days per week: Patient refused    Minutes per session: Patient refused  . Stress: Patient refused  Relationships  . Social connections:    Talks on phone: Patient refused    Gets together: Patient refused    Attends religious service: Patient refused    Active member of club or organization: Patient refused    Attends meetings of clubs or organizations: Patient refused    Relationship status: Patient refused  . Intimate partner violence:    Fear of current or ex partner: Patient refused    Emotionally abused: Patient refused    Physically abused: Patient refused    Forced sexual activity: Patient refused  Other Topics Concern  . Not on file  Social History Narrative   Patient is married and lives at home with her husband.              The patient currently resides (home / rehab facility / nursing home): Home The patient normally is (ambulatory / bedbound): Ambulatory   FAMILY HISTORY:  Family History  Problem Relation Age of Onset  . Depression Maternal Grandmother   . Hypertension Maternal Grandmother   . Cancer Maternal Grandfather   . Lung cancer Mother   . Depression Mother   . Learning disabilities Sister   . Depression Sister   . Depression Paternal Uncle   . Hypertension Paternal Uncle   . Diabetes Neg Hx   . Breast cancer Neg Hx     REVIEW OF SYSTEMS:  Constitutional: denies weight loss, fever, chills, or sweats  Eyes: denies any other vision changes, history of eye injury  ENT: denies sore throat, hearing problems  Respiratory: denies shortness of breath, wheezing  Cardiovascular: denies chest pain, palpitations  Gastrointestinal: abdominal pain, N/V, and bowel function as per HPI Genitourinary: denies burning with urination or urinary  frequency Musculoskeletal: denies any other joint pains or cramps  Skin: denies any other rashes or skin discolorations  Neurological: denies any other headache, dizziness, weakness  Psychiatric: denies any other depression, anxiety   All other review of systems were negative   VITAL SIGNS:  Temp:  [98.4 F (36.9 C)] 98.4 F (36.9 C) (01/27 1412) Pulse Rate:  [116] 116 (01/27 1412) Resp:  [18] 18 (01/27 1412) BP: (147)/(107) 147/107 (01/27 1412) SpO2:  [97 %] 97 % (01/27 1412) Weight:  [77.1 kg] 77.1 kg (01/27 1413)     Height: 5\' 3"  (160 cm) Weight: 77.1 kg BMI (Calculated): 30.12   INTAKE/OUTPUT:  This shift: No intake/output data recorded.  Last 2 shifts: @IOLAST2SHIFTS @   PHYSICAL EXAM:  Constitutional:  -- Overweight body  habitus  -- Awake, alert, and oriented x3, no apparent distress Eyes:  -- Pupils equally round and reactive to light  -- No scleral icterus, B/L no occular discharge Ear, nose, throat: -- Neck is FROM WNL -- No jugular venous distension  Pulmonary:  -- No wheezes or rhales -- Equal breath sounds bilaterally -- Breathing non-labored at rest Cardiovascular:  -- S1, S2 present  -- No pericardial rubs  Gastrointestinal:  -- Abdomen soft and non-distended with focal RLQ abdominal tenderness to palpation, no guarding or rebound tenderness -- No abdominal masses appreciated, pulsatile or otherwise  Musculoskeletal and Integumentary:  -- Wounds or skin discoloration: None appreciated -- Extremities: B/L UE and LE FROM, hands and feet warm, no edema  Neurologic:  -- Motor function: Intact and symmetric -- Sensation: Intact and symmetric Psychiatric:  -- Mood and affect WNL  Labs:  CBC Latest Ref Rng & Units 04/01/2018 12/06/2017 12/05/2017  WBC 4.0 - 10.5 K/uL 7.9 8.7 7.6  Hemoglobin 12.0 - 15.0 g/dL 13.7 13.9 14.0  Hematocrit 36.0 - 46.0 % 40.4 39.4 39.7  Platelets 150 - 400 K/uL 309 302 294   CMP Latest Ref Rng & Units 04/01/2018 12/06/2017  12/05/2017  Glucose 70 - 99 mg/dL 108(H) 97 114(H)  BUN 6 - 20 mg/dL 10 9 8   Creatinine 0.44 - 1.00 mg/dL 0.61 0.65 0.79  Sodium 135 - 145 mmol/L 139 138 139  Potassium 3.5 - 5.1 mmol/L 3.3(L) 3.8 3.9  Chloride 98 - 111 mmol/L 107 106 104  CO2 22 - 32 mmol/L 24 22 26   Calcium 8.9 - 10.3 mg/dL 9.3 9.2 9.4  Total Protein 6.5 - 8.1 g/dL 7.2 7.5 7.5  Total Bilirubin 0.3 - 1.2 mg/dL 0.5 0.7 0.6  Alkaline Phos 38 - 126 U/L 118 109 110  AST 15 - 41 U/L 31 33 30  ALT 0 - 44 U/L 31 25 25    Imaging studies:  CT Abdomen and Pelvis without Contrast (04/01/2018) - personally reviewed and discussed with patient Stomach/Bowel: Unremarkable stomach. Mild haziness within the small bowel mesentery with multiple small lymph nodes, relatively unchanged in number and conspicuity from the prior CT. No transition point. No focal wall thickening.  The appendix is not enlarged. There is hyperdense material just beyond the base of the appendix within the lumen. No focal fluid. There is trace inflammatory change extending from the tip of the appendix into the retro colic fascial plane.  Vascular/Lymphatic: No significant atherosclerotic changes.  Small lymph nodes within the small bowel mesentery and associated haziness of the fat. These changes were present dating to the remote CT of 11/24/2014, relatively unchanged over time.  Reproductive: Hysterectomy Other: Fat containing umbilical hernia  Assessment/Plan: (ICD-10's: K18.80) 48 y.o. female with clinical evidence and radiographic findings consistent with early acute appendicitis, complicated by pertinent comorbidities including asthma, OSA, history of stroke, GERD, osteoarthritis, chronic ongoing tobacco and marijuana abuse (smoking), migraine headaches, chronic neck pain, generalized anxiety disorder, and major depression disorder.   - pain control prn   - NPO for now with IV fluids  - IV antibiotics (ceftriaxone and metronidazole)  - all  risks, benefits, and alternatives to appendectomy (including observation and antibiotics) were discussed with the patient, all of her questions were answered to her expressed satisfaction, patient expresses she wishes to proceed, and informed consent was obtained.   - will plan to proceed with laparoscopic appendectomy pending anesthesia and OR availability  - DVT prophylaxis, ambulation encouraged  All of the above findings  and recommendations were discussed with the patient, and all of patient's questions were answered to her expressed satisfaction.  Thank you for the opportunity to participate in this patient's care.   -- [name], MD, [place]: [facility] - Partnering for exceptional care. Office: [phone]

## 2018-04-01 NOTE — Anesthesia Procedure Notes (Signed)
Procedure Name: Intubation Date/Time: 04/01/2018 8:12 PM Performed by: Lendon Colonel, CRNA Pre-anesthesia Checklist: Patient identified, Patient being monitored, Timeout performed, Emergency Drugs available and Suction available Patient Re-evaluated:Patient Re-evaluated prior to induction Oxygen Delivery Method: Circle system utilized Preoxygenation: Pre-oxygenation with 100% oxygen Induction Type: IV induction Ventilation: Mask ventilation without difficulty Laryngoscope Size: McGraph, 3 and Mac Grade View: Grade I Tube type: Oral Tube size: 7.0 mm Number of attempts: 1 Airway Equipment and Method: Stylet Placement Confirmation: ETT inserted through vocal cords under direct vision,  positive ETCO2 and breath sounds checked- equal and bilateral Secured at: 21 cm Tube secured with: Tape Dental Injury: Teeth and Oropharynx as per pre-operative assessment

## 2018-04-01 NOTE — ED Notes (Signed)
Dr Davis at bedside.

## 2018-04-01 NOTE — ED Triage Notes (Signed)
Pain R lower abdomen since yesterday. Denies dysuria.

## 2018-04-01 NOTE — Anesthesia Post-op Follow-up Note (Signed)
Anesthesia QCDR form completed.        

## 2018-04-01 NOTE — ED Notes (Signed)
Surgeon states patient to go to OR.  Patient verbalized understanding.  Patient is undressed and placed in gown at this time.

## 2018-04-02 ENCOUNTER — Encounter: Payer: Self-pay | Admitting: Surgery

## 2018-04-02 MED ORDER — OXYCODONE HCL 5 MG PO TABS: 5.0000 mg | ORAL_TABLET | Freq: Four times a day (QID) | ORAL | 0 refills | Status: DC | PRN

## 2018-04-02 NOTE — Discharge Instructions (Signed)
In addition to included general post-operative instructions for laparoscopic appendectomy (appendix removal),  Diet: Resume home diet.   Activity: No heavy lifting >20 pounds (children, pets, laundry, garbage) or strenuous activity until follow-up, but light activity and walking are encouraged. Do not drive or drink alcohol if taking narcotic pain medications.  Wound care: 2 days after surgery (04/03/2018), you may shower/get incision wet with soapy water and pat dry (do not rub incisions), but no baths or submerging incision underwater until follow-up.   Medications: Resume all home medications. For mild to moderate pain: acetaminophen (Tylenol) or ibuprofen/naproxen (if no kidney disease). Combining Tylenol with alcohol can substantially increase your risk of causing liver disease. Narcotic pain medications, if prescribed, can be used for severe pain, though may cause nausea, constipation, and drowsiness. Do not combine Tylenol and Percocet (or similar) within a 6 hour period as Percocet (and similar) contain(s) Tylenol. If you do not need the narcotic pain medication, you do not need to fill the prescription.  Call office [phone] / [phone]) at any time if any questions, worsening pain, fevers/chills, bleeding, drainage from incision site, or other concerns.

## 2018-04-02 NOTE — Discharge Summary (Addendum)
Patient seen and examined as described below with surgical PA-C, [name].  All of the above findings and recommendations were discussed with the patient and patient's family, and all of patient's and family's questions were answered to their expressed satisfaction.  -- [name], MD, [place]: [facility] - Partnering for exceptional care. Office: [phone]  [facility] SURGICAL DISCHARGE SUMMARY  Patient ID: [name] MRN: [id number] DOB/AGE: 11-12-1970 48 y.o.  Admit date: 04/01/2018 Discharge date: 04/02/2018  Discharge Diagnoses Acute Appendicitis  Consultants None  Procedures -- 04/01/2018:  Laparoscopic Appendectomy  HPI: 48 y.o. female presented to [facility] ED today for evaluation of acute onset severe RLQ abdominal pain. Patient reports she first began to experience peri-umbilical abdominal pain yesterday, at which time she thought her pain was that of an ovarian cyst she's experienced previously, but when it this morning shifted to her RLQ, she acknowledged that this was different than the pain she's experienced previously. When her pain did not improve, she presented this evening to [facility] ED accordingly. She recently also experienced ~48 hours diarrhea and last ate at 11 am this morning, denies fever/chills, feels nauseous without emesis, and denies CP. She says she occasionally experiences SOB, though is able to walk >1 - 2 blocks and up/down flights of steps without CP or SOB. She denies any prior similar symptoms.She was admitted to general surgery with plans for laparoscopic appendectomy.  Hospital Course: Informed consent was obtained and documented, and patient underwent uneventful laparoscopic appendectomy (Dr Rosana Hoes, 04/01/2018).  Post-operatively, patient's pain improved/resolved and advancement of patient's diet and ambulation were well-tolerated. The remainder of patient's hospital course was  essentially unremarkable, and discharge planning was initiated accordingly with patient safely able to be discharged home with appropriate discharge instructions, pain control, and outpatient follow-up after all of her and her family's questions were answered to their expressed satisfaction.   Discharge Condition: Good   Physical Examination:  Constitutional: Well appearing female, NAD HEENT: EOM intact, no scleral incterus Pulmonary: No respiratory distress, Normal effort Gastrointestinal: Soft, mild incisional tenderness, non distended Skin: Laparoscopic incisions are CDI, no erythema or drainage.    Allergies as of 04/02/2018      Reactions   Bee Venom Anaphylaxis   Shellfish Allergy Anaphylaxis   Contrast Media [iodinated Diagnostic Agents] Other (See Comments)   Unknown  Reaction:  Unknown    Other Other (See Comments)   Unknown    Penicillins Other (See Comments)   Reaction:  Seizures  Has patient had a PCN reaction causing immediate rash, facial/tongue/throat swelling, SOB or lightheadedness with hypotension: No Has patient had a PCN reaction causing severe rash involving mucus membranes or skin necrosis: No Has patient had a PCN reaction that required hospitalization Yes Has patient had a PCN reaction occurring within the last 10 years: Yes If all of the above answers are "NO", then may proceed with Cephalosporin use.   Sulfa Antibiotics Itching      Medication List    TAKE these medications   acetaminophen 500 MG tablet Commonly known as:  TYLENOL Take 1,000 mg by mouth every 6 (six) hours as needed for mild pain or headache.   albuterol 108 (90 Base) MCG/ACT inhaler Commonly known as:  PROVENTIL HFA;VENTOLIN HFA Inhale 2 puffs into the lungs every 6 (six) hours as needed for wheezing or shortness of breath.   amitriptyline 10 MG tablet Commonly known as:  ELAVIL Take 50 mg by mouth at bedtime.  b complex vitamins capsule Take 1 capsule by mouth daily.    EPINEPHrine 0.3 mg/0.3 mL Soaj injection Commonly known as:  EPI-PEN Inject into the muscle.   magnesium oxide 400 MG tablet Commonly known as:  MAG-OX Take 400 mg by mouth daily.   oxyCODONE 5 MG immediate release tablet Commonly known as:  Oxy IR/ROXICODONE Take 1 tablet (5 mg total) by mouth every 6 (six) hours as needed for severe pain or breakthrough pain.   PARoxetine 10 MG tablet Commonly known as:  PAXIL Take 10 mg by mouth daily.        Follow-up Information    Vickie Epley, MD. Schedule an appointment as soon as possible for a visit in 2 week(s).   Specialty:  General Surgery Why:  s/p lap appy Contact information: [address] [place] [address] [phone]            -- Zachary Schulz , PA-C [facility]  04/02/2018, 9:43 AM [phone] M-F: 7am - 4pm

## 2018-04-02 NOTE — Care Management Obs Status (Signed)
[place] NOTIFICATION   Patient Details  Name: [name] MRN: [id number] Date of Birth: 09-16-1970   Medicare Observation Status Notification Given:  No(admitted obs less thand 24 hours)    Beverly Sessions, RN 04/02/2018, 11:13 AM

## 2018-04-02 NOTE — Anesthesia Postprocedure Evaluation (Signed)
Anesthesia Post Note  Patient: [name]  Procedure(s) Performed: APPENDECTOMY LAPAROSCOPIC (N/A )  Patient location during evaluation: PACU Anesthesia Type: General Level of consciousness: awake and alert Pain management: pain level controlled Vital Signs Assessment: post-procedure vital signs reviewed and stable Respiratory status: spontaneous breathing and respiratory function stable Cardiovascular status: stable Anesthetic complications: no     Last Vitals:  Vitals:   04/01/18 2257 04/01/18 2329  BP: 91/74 108/74  Pulse:  95  Resp: 18 18  Temp: 36.8 C 36.8 C  SpO2: 100% 98%    Last Pain:  Vitals:   04/01/18 2329  TempSrc: Oral  PainSc:                  Dontravious Camille K

## 2018-04-02 NOTE — Progress Notes (Signed)
Pt is being discharged to home. AVS was given and explained to the pt and she verbalized understanding of all information.

## 2018-04-04 ENCOUNTER — Encounter: Payer: Self-pay | Admitting: Emergency Medicine

## 2018-04-04 ENCOUNTER — Other Ambulatory Visit: Payer: Self-pay

## 2018-04-04 ENCOUNTER — Emergency Department: Payer: Medicare HMO

## 2018-04-04 ENCOUNTER — Emergency Department
Admission: EM | Admit: 2018-04-04 | Discharge: 2018-04-04 | Disposition: A | Discharge status: Home / Self Care | Payer: Medicare HMO | Attending: Emergency Medicine | Admitting: Emergency Medicine

## 2018-04-04 DIAGNOSIS — H9191 Unspecified hearing loss, right ear: Secondary | ICD-10-CM | POA: Diagnosis not present

## 2018-04-04 DIAGNOSIS — N289 Disorder of kidney and ureter, unspecified: Secondary | ICD-10-CM | POA: Diagnosis not present

## 2018-04-04 DIAGNOSIS — Z09 Encounter for follow-up examination after completed treatment for conditions other than malignant neoplasm: Secondary | ICD-10-CM | POA: Diagnosis not present

## 2018-04-04 DIAGNOSIS — Z79899 Other long term (current) drug therapy: Secondary | ICD-10-CM | POA: Insufficient documentation

## 2018-04-04 DIAGNOSIS — F17290 Nicotine dependence, other tobacco product, uncomplicated: Secondary | ICD-10-CM | POA: Insufficient documentation

## 2018-04-04 DIAGNOSIS — Z9049 Acquired absence of other specified parts of digestive tract: Secondary | ICD-10-CM | POA: Diagnosis not present

## 2018-04-04 DIAGNOSIS — R109 Unspecified abdominal pain: Secondary | ICD-10-CM | POA: Insufficient documentation

## 2018-04-04 DIAGNOSIS — D7389 Other diseases of spleen: Secondary | ICD-10-CM | POA: Diagnosis not present

## 2018-04-04 DIAGNOSIS — G8918 Other acute postprocedural pain: Secondary | ICD-10-CM

## 2018-04-04 DIAGNOSIS — R590 Localized enlarged lymph nodes: Secondary | ICD-10-CM | POA: Diagnosis not present

## 2018-04-04 DIAGNOSIS — J449 Chronic obstructive pulmonary disease, unspecified: Secondary | ICD-10-CM | POA: Diagnosis not present

## 2018-04-04 DIAGNOSIS — Z8673 Personal history of transient ischemic attack (TIA), and cerebral infarction without residual deficits: Secondary | ICD-10-CM | POA: Insufficient documentation

## 2018-04-04 DIAGNOSIS — Z9089 Acquired absence of other organs: Secondary | ICD-10-CM | POA: Insufficient documentation

## 2018-04-04 DIAGNOSIS — R42 Dizziness and giddiness: Secondary | ICD-10-CM | POA: Insufficient documentation

## 2018-04-04 DIAGNOSIS — R531 Weakness: Secondary | ICD-10-CM | POA: Diagnosis not present

## 2018-04-04 DIAGNOSIS — N2889 Other specified disorders of kidney and ureter: Secondary | ICD-10-CM | POA: Diagnosis not present

## 2018-04-04 LAB — CBC WITH DIFFERENTIAL/PLATELET
Abs Immature Granulocytes: 0.06 10*3/uL (ref 0.00–0.07)
Basophils Absolute: 0.1 10*3/uL (ref 0.0–0.1)
Basophils Relative: 1 %
Eosinophils Absolute: 0.1 10*3/uL (ref 0.0–0.5)
Eosinophils Relative: 1 %
HCT: 41.6 % (ref 36.0–46.0)
Hemoglobin: 13.8 g/dL (ref 12.0–15.0)
Immature Granulocytes: 1 %
Lymphocytes Relative: 26 %
Lymphs Abs: 2.9 10*3/uL (ref 0.7–4.0)
MCH: 28.9 pg (ref 26.0–34.0)
MCHC: 33.2 g/dL (ref 30.0–36.0)
MCV: 87.2 fL (ref 80.0–100.0)
Monocytes Absolute: 1.1 10*3/uL — ABNORMAL HIGH (ref 0.1–1.0)
Monocytes Relative: 10 %
Neutro Abs: 7.2 10*3/uL (ref 1.7–7.7)
Neutrophils Relative %: 61 %
PLATELETS: 280 10*3/uL (ref 150–400)
RBC: 4.77 MIL/uL (ref 3.87–5.11)
RDW: 11.8 % (ref 11.5–15.5)
WBC: 11.4 10*3/uL — ABNORMAL HIGH (ref 4.0–10.5)
nRBC: 0 % (ref 0.0–0.2)

## 2018-04-04 LAB — URINALYSIS, COMPLETE (UACMP) WITH MICROSCOPIC
Bilirubin Urine: NEGATIVE
Glucose, UA: NEGATIVE mg/dL
Ketones, ur: NEGATIVE mg/dL
Leukocytes, UA: NEGATIVE
Nitrite: NEGATIVE
Protein, ur: NEGATIVE mg/dL
Specific Gravity, Urine: 1.008 (ref 1.005–1.030)
pH: 7 (ref 5.0–8.0)

## 2018-04-04 LAB — LIPASE, BLOOD: LIPASE: 24 U/L (ref 11–51)

## 2018-04-04 LAB — COMPREHENSIVE METABOLIC PANEL
ALT: 25 U/L (ref 0–44)
ANION GAP: 9 (ref 5–15)
AST: 29 U/L (ref 15–41)
Albumin: 3.9 g/dL (ref 3.5–5.0)
Alkaline Phosphatase: 104 U/L (ref 38–126)
BUN: 7 mg/dL (ref 6–20)
CO2: 27 mmol/L (ref 22–32)
Calcium: 8.4 mg/dL — ABNORMAL LOW (ref 8.9–10.3)
Chloride: 103 mmol/L (ref 98–111)
Creatinine, Ser: 0.63 mg/dL (ref 0.44–1.00)
GFR calc non Af Amer: >60 mL/min (ref >60)
Glucose, Bld: 94 mg/dL (ref 70–99)
Potassium: 3 mmol/L — ABNORMAL LOW (ref 3.5–5.1)
Sodium: 139 mmol/L (ref 135–145)
Total Bilirubin: 0.7 mg/dL (ref 0.3–1.2)
Total Protein: 6.9 g/dL (ref 6.5–8.1)

## 2018-04-04 MED ORDER — SODIUM CHLORIDE 0.9 % IV BOLUS
1000.0000 mL | Freq: Once | INTRAVENOUS | Status: AC
  Administered 2018-04-04: 1000 mL via INTRAVENOUS

## 2018-04-04 MED ORDER — POTASSIUM CHLORIDE CRYS ER 20 MEQ PO TBCR
40.0000 meq | EXTENDED_RELEASE_TABLET | Freq: Once | ORAL | Status: AC
  Administered 2018-04-04: 40 meq via ORAL
  Filled 2018-04-04: qty 2

## 2018-04-04 MED ORDER — POLYETHYLENE GLYCOL 3350 17 G PO PACK: 17.0000 g | PACK | Freq: Every day | ORAL | 0 refills | Status: DC

## 2018-04-04 NOTE — ED Triage Notes (Signed)
PT arrives from [facility]. Pt has appendectomy Monday and went for follow up appt today. PT states she felt dizzy at appt and they checked her blood pressure with a reading of 80/60. In triage pt's blood pressure 103/68 and pt reports she continues to feel weak.

## 2018-04-04 NOTE — ED Notes (Signed)
EDP in to see pt

## 2018-04-04 NOTE — ED Notes (Signed)
Arrives to ER via wheelchair from [facility]. States that she was there for a post op followup appt from appendectomy on Monday. Reports she began to feel dizzy and had drop in BP. Pt in NAD. RR even and unlabored. Pt appears pale.

## 2018-04-04 NOTE — Discharge Instructions (Addendum)
Drink plenty of fluids, follow closely with your surgeon as indicated return for any new or worsening symptoms good lightheadedness, abdominal pain fever vomiting or other concerns

## 2018-04-04 NOTE — ED Provider Notes (Addendum)
[facility] Emergency Department Provider Note  ____________________________________________   I have reviewed the triage vital signs and the nursing notes. Where available I have reviewed prior notes and, if possible and indicated, outside hospital notes.    HISTORY  Chief Complaint Post-op Problem    HPI [name] is a 48 y.o. female with a history of anxiety, arthritis, chronic neck pain, depression, migraines, reflux disease, hearing loss, who had an appendectomy on Monday, laparoscopic, January 27.  She states that she has been doing okay since that time.  To have passing bowel movements, no fevers, taking Tylenol and or Percocet for the pain.  States that she did not take a pain pill this morning.  States that her abdominal pain seems to be somewhat worse today than yesterday.  She is had no vomiting.  No melena no bright red blood per rectum, she has had no cough, no dysuria no urinary frequency, however, she was at her doctor's appointment today for routine follow-up and she sat on a chair and felt very lightheaded.  She states that for a few minutes she felt a little presyncopal although she did not pass out.  They checked her pressure at that time it was 80.  Patient states she has not had much to drink today, she did, however, have part of a muffin for breakfast.  States at this time besides abdominal pain which is again somewhat pronounced today, a change that she attributes to not having taken a pain pill this morning, she feels pretty good at this moment.  Last bowel movement was this morning.  Patient does vape, she is not however vaping anything unusual for her, she has been on the same vaping solution for some time.  She did vape today.  She denies any other herbal or medication changes recently.   Past Medical History:  Diagnosis Date  . Anxiety   . Arthritis   . Asthma   . Bilateral shoulder pain   . Chronic neck pain   . Depression   . GERD  (gastroesophageal reflux disease)   . Migraines   . Nasal septal perforation   . Seizure ([place])   . Sleep apnea   . Stroke [facility])     Patient Active Problem List   Diagnosis Date Noted  . Acute appendicitis 04/01/2018  . Migraine 11/05/2017  . RAD (reactive airway disease), moderate persistent, uncomplicated 30/11/2328  . Acute cystitis without hematuria   . Change in mental status 10/26/2015  . Nonepileptic episode ([place]) 10/26/2015  . Adult BMI 30+ 10/18/2015  . Atopic rhinitis 10/18/2015  . Reactive airway disease without complication 07/62/2633  . Recurrent major depressive disorder, in partial remission ([place]) 10/18/2015  . Snoring 10/18/2015  . Other epilepsy, intractable, without status epilepticus ([place]) 10/04/2015  . Spells of speech arrest 08/10/2015  . Shaking spells 08/10/2015  . Apneic spell 08/10/2015  . Primary osteoarthritis involving multiple joints 07/25/2015  . Hot flashes 07/25/2015  . Chronic pain of both shoulders 06/26/2015  . Spondylosis of cervical region without myelopathy or radiculopathy 06/26/2015  . Pseudoseizure   . Status epilepticus due to refractory epilepsy ([place]) 06/22/2015  . Chronic nonintractable headache 06/04/2015  . Blurry vision, right eye 05/18/2015  . Depression, major, single episode, complete remission ([place]) 05/18/2015  . Renal calculus 05/18/2015  . Pseudoseizures 05/11/2015  . Migraines 05/11/2015  . Abdominal pain 05/10/2015  . Allergy to bee sting 12/01/2014  . Pain in toe of left foot 12/01/2014  .  Nausea 12/01/2014  . Gastroesophageal reflux disease without esophagitis 12/01/2014  . Difficulty breathing 10/19/2014  . Seizures ([place]) 05/17/2014  . Respiratory failure, acute ([place]) 05/16/2014  . Depression, unspecified depression type 05/14/2014  . Headache disorder 04/13/2014  . Spells 04/13/2014  . Anxiety 01/07/2014  . Depression 01/07/2014  . Acute headache 12/02/2013  . Rotator cuff (capsule) sprain and strain  11/27/2013  . Midline low back pain without sciatica 10/29/2013  . Localized swelling, mass and lump, neck 10/29/2013  . Seizure disorder ([place]) 10/29/2013  . COPD (chronic obstructive pulmonary disease) ([place])   . Emphysema/COPD ([place])   . Sleep apnea   . Stroke ([place])   . Pain, chronic 07/11/2013    Past Surgical History:  Procedure Laterality Date  . ABDOMINAL HYSTERECTOMY     partial  . CESAREAN SECTION  1993  . LAPAROSCOPIC APPENDECTOMY N/A 04/01/2018   Procedure: APPENDECTOMY LAPAROSCOPIC;  Surgeon: Vickie Epley, MD;  Location: [facility] ORS;  Service: General;  Laterality: N/A;  . PARTIAL HYSTERECTOMY  1995  . TONSILLECTOMY      Prior to Admission medications   Medication Sig Start Date End Date Taking? Authorizing Provider  acetaminophen (TYLENOL) 500 MG tablet Take 1,000 mg by mouth every 6 (six) hours as needed for mild pain or headache.    [provider]  albuterol (PROVENTIL HFA;VENTOLIN HFA) 108 (90 BASE) MCG/ACT inhaler Inhale 2 puffs into the lungs every 6 (six) hours as needed for wheezing or shortness of breath.     [provider]  amitriptyline (ELAVIL) 10 MG tablet Take 50 mg by mouth at bedtime. 11/30/17   [provider]  b complex vitamins capsule Take 1 capsule by mouth daily. 11/05/17   Sudini, Alveta Heimlich, MD  EPINEPHrine 0.3 mg/0.3 mL IJ SOAJ injection Inject into the muscle.    [provider]  magnesium oxide (MAG-OX) 400 MG tablet Take 400 mg by mouth daily.    [provider]  oxyCODONE (OXY IR/ROXICODONE) 5 MG immediate release tablet Take 1 tablet (5 mg total) by mouth every 6 (six) hours as needed for severe pain or breakthrough pain. 04/02/18   Tylene Fantasia, PA-C  PARoxetine (PAXIL) 10 MG tablet Take 10 mg by mouth daily.    [provider]    Allergies Bee venom; Shellfish allergy; Contrast media [iodinated diagnostic agents]; Other; Penicillins; and Sulfa antibiotics  Family History  Problem  Relation Age of Onset  . Depression Maternal Grandmother   . Hypertension Maternal Grandmother   . Cancer Maternal Grandfather   . Lung cancer Mother   . Depression Mother   . Learning disabilities Sister   . Depression Sister   . Depression Paternal Uncle   . Hypertension Paternal Uncle   . Diabetes Neg Hx   . Breast cancer Neg Hx     Social History Social History   Tobacco Use  . Smoking status: Current Every Day Smoker    Types: E-cigarettes  . Smokeless tobacco: Never Used  Substance Use Topics  . Alcohol use: No  . Drug use: No    Review of Systems Constitutional: No fever/chills Eyes: No visual changes. ENT: No sore throat. No stiff neck no neck pain Cardiovascular: Denies chest pain. Respiratory: Denies shortness of breath. Gastrointestinal:   no vomiting.  No diarrhea.  No constipation. Genitourinary: Negative for dysuria. Musculoskeletal: Negative lower extremity swelling Skin: Negative for rash. Neurological: Negative for severe headaches, focal weakness or numbness.   ____________________________________________   PHYSICAL EXAM:  VITAL SIGNS: ED Triage Vitals  Enc Vitals Group     BP 04/04/18 1120 103/68     Pulse Rate 04/04/18 1120 88     Resp 04/04/18 1120 14     Temp 04/04/18 1120 97.8 F (36.6 C)     Temp Source 04/04/18 1120 Oral     SpO2 04/04/18 1120 97 %     Weight 04/04/18 1120 165 lb (74.8 kg)     Height 04/04/18 1120 5\' 3"  (1.6 m)     Head Circumference --      Peak Flow --      Pain Score 04/04/18 1127 7     Pain Loc --      Pain Edu? --      Excl. in [place]? --     Constitutional: Alert and oriented. Well appearing and in no acute distress. Eyes: Conjunctivae are normal Head: Atraumatic HEENT: No congestion/rhinnorhea. Mucous membranes are dry.  Oropharynx non-erythematous Neck:   Nontender with no meningismus, no masses, no stridor Cardiovascular: Normal rate, regular rhythm. Grossly normal heart sounds.  Good peripheral  circulation. Respiratory: Normal respiratory effort.  No retractions. Lungs CTAB. Abdominal: Surgical sites are clean dry and intact, patient does have significant tenderness on the left side, but her abdomen is soft, no pronounced surgical signs at this time.  Bowel sounds are intact.  No distention. No guarding no rebound Back:  There is no focal tenderness or step off.  there is no midline tenderness there are no lesions noted. there is no CVA tenderness Musculoskeletal: No lower extremity tenderness, no upper extremity tenderness. No joint effusions, no DVT signs strong distal pulses no edema Neurologic:  Normal speech and language. No gross focal neurologic deficits are appreciated.  Skin:  Skin is warm, dry and intact. No rash noted. Psychiatric: Mood and affect are normal. Speech and behavior are normal.  ____________________________________________   LABS (all labs ordered are listed, but only abnormal results are displayed)  Labs Reviewed  CBC WITH DIFFERENTIAL/PLATELET  COMPREHENSIVE METABOLIC PANEL  LIPASE, BLOOD  URINALYSIS, COMPLETE (UACMP) WITH MICROSCOPIC    Pertinent labs  results that were available during my care of the patient were reviewed by me and considered in my medical decision making (see chart for details). ____________________________________________  EKG  I personally interpreted any EKGs ordered by me or triage Normal sinus rhythm rate 80 bpm no acute ST elevation or depression normal axis unremarkable EKG ____________________________________________  RADIOLOGY  Pertinent labs & imaging results that were available during my care of the patient were reviewed by me and considered in my medical decision making (see chart for details). If possible, patient and/or family made aware of any abnormal findings.  No results found. ____________________________________________    PROCEDURES  Procedure(s) performed: None  Procedures  Critical Care  performed: None  ____________________________________________   INITIAL IMPRESSION / ASSESSMENT AND PLAN / ED COURSE  Pertinent labs & imaging results that were available during my care of the patient were reviewed by me and considered in my medical decision making (see chart for details).  Patient here with a few moments of lightheadedness in the context of the postoperative period for an appendix.  Her abdomen is nonsurgical but does seem to be quite tender, it is hard to tell if this is routine postop tenderness or if there is something else going on.  EG is normal and I think this was a event from a cardiac dysrhythmia.  In the postoperative period pneumonia is possible  but her lungs appear clear we will get a chest x-ray, will check a urine for urinalysis, basic blood work including H&H, we will give her hydration after checking orthostatics, and I think in her best interest would be a CT scan although she is allergic to contrast dye.  I will talk to surgery about whether they would like him.  They may wish also to see the patient decide whether they want her scanned or not.  Certainly a few minutes of lightheadedness on its own if not overly concerning she has had decreased p.o. and looks a little bit dehydrated.  ----------------------------------------- 1:54 PM on 04/04/2018 -----------------------------------------  Patient in no acute distress, her work-up is entirely reassuring here.  Patient states that she thinks she "overdid it" because she had not really been out about since her surgery and she has not had much to drink today.  I have encouraged her good p.o.  She was not orthostatic prior to fluids.  Even though she felt fine when she got here she states she feels even better now abdomen is nonsurgical I discussed with Dr. Rosana Hoes who agrees with discharge, return precautions were given and understood.  There is trace bacteria in the patient's urine we will send a culture around that  there is any indication for treatment at this time, and potassium is slightly low which we will replenish but I do not think caused her symptoms.  Patient is having bowel movements today and yesterday, she has some evidence of mild constipation I will give her something for that.     ____________________________________________   FINAL CLINICAL IMPRESSION(S) / ED DIAGNOSES  Final diagnoses:  None      This chart was dictated using voice recognition software.  Despite best efforts to proofread,  errors can occur which can change meaning.      Schuyler Amor, MD 04/04/18 1150    Schuyler Amor, MD 04/04/18 1235    Schuyler Amor, MD 04/04/18 1356

## 2018-04-05 LAB — URINE CULTURE: Culture: NO GROWTH

## 2018-04-05 LAB — SURGICAL PATHOLOGY

## 2018-04-16 ENCOUNTER — Other Ambulatory Visit: Payer: Self-pay

## 2018-04-16 ENCOUNTER — Encounter: Payer: Self-pay | Admitting: Surgery

## 2018-04-16 ENCOUNTER — Ambulatory Visit (INDEPENDENT_AMBULATORY_CARE_PROVIDER_SITE_OTHER): Payer: Medicare HMO | Admitting: Surgery

## 2018-04-16 VITALS — BP 125/83 | HR 120 | Temp 97.7°F | Resp 16 | Ht 63.0 in | Wt 173.8 lb

## 2018-04-16 DIAGNOSIS — Z4889 Encounter for other specified surgical aftercare: Secondary | ICD-10-CM

## 2018-04-16 DIAGNOSIS — K3580 Unspecified acute appendicitis: Secondary | ICD-10-CM

## 2018-04-16 NOTE — Patient Instructions (Signed)
Return as needed.The patient is aware to call back for any questions or concerns.  

## 2018-04-16 NOTE — Progress Notes (Signed)
Surgical Clinic Progress/Follow-up Note   HPI:  [name] presents to clinic for post-op follow-up 2 weeks s/p laparoscopic appendectomy Rosana Hoes, 04/01/2018). Patient reports complete resolution of pre-operative pain and has been tolerating regular diet with +flatus and normal BM's, denies N/V, fever/chills, CP, or SOB. Shortly after surgery, she presented to [facility] ED for what appears to have been constipation that has since resolved.  Review of Systems:  Constitutional: denies fever/chills  Respiratory: denies shortness of breath, wheezing  Cardiovascular: denies chest pain, palpitations  Gastrointestinal: abdominal pain, N/V, and bowel function as per interval history Skin: Denies any other rashes or skin discolorations except post-surgical wounds as per interval history  Vital Signs:  BP 125/83   Pulse (!) 120   Temp 97.7 F (36.5 C) (Temporal)   Resp 16   Ht 5\' 3"  (1.6 m)   Wt 173 lb 12.8 oz (78.8 kg)   LMP  (LMP Unknown)   SpO2 98%   BMI 30.79 kg/m    Physical Exam:  Constitutional:  -- Overweight non-obese body habitus  -- Awake, alert, and oriented x3  Pulmonary:  -- No crackles -- Equal breath sounds bilaterally -- Breathing non-labored at rest Cardiovascular:  -- S1, S2 present  -- No pericardial rubs  Gastrointestinal:  -- Soft and non-distended, non-tender to palpation, no guarding/rebound tenderness -- Post-surgical incisions all well-approximated without any peri-incisional erythema or drainage -- No abdominal masses appreciated, pulsatile or otherwise  Musculoskeletal / Integumentary:  -- Wounds or skin discoloration: None appreciated except post-surgical incisions as described above (GI) -- Extremities: B/L UE and LE FROM, hands and feet warm, no edema   Laboratory studies:  CBC Latest Ref Rng & Units 04/04/2018 04/01/2018 12/06/2017  WBC 4.0 - 10.5 K/uL 11.4(H) 7.9 8.7  Hemoglobin 12.0 - 15.0 g/dL 13.8 13.7 13.9  Hematocrit 36.0 - 46.0 % 41.6 40.4  39.4  Platelets 150 - 400 K/uL 280 309 302   CMP Latest Ref Rng & Units 04/04/2018 04/01/2018 12/06/2017  Glucose 70 - 99 mg/dL 94 108(H) 97  BUN 6 - 20 mg/dL 7 10 9   Creatinine 0.44 - 1.00 mg/dL 0.63 0.61 0.65  Sodium 135 - 145 mmol/L 139 139 138  Potassium 3.5 - 5.1 mmol/L 3.0(L) 3.3(L) 3.8  Chloride 98 - 111 mmol/L 103 107 106  CO2 22 - 32 mmol/L 27 24 22   Calcium 8.9 - 10.3 mg/dL 8.4(L) 9.3 9.2  Total Protein 6.5 - 8.1 g/dL 6.9 7.2 7.5  Total Bilirubin 0.3 - 1.2 mg/dL 0.7 0.5 0.7  Alkaline Phos 38 - 126 U/L 104 118 109  AST 15 - 41 U/L 29 31 33  ALT 0 - 44 U/L 25 31 25    Imaging:  CT Abdomen and Pelvis without Contrast (04/04/2018) - personally reviewed and discussed with patient Mild expected postoperative changes identified in the right lower quadrant. No abscess is noted.  Moderate bowel content is identified throughout the colon which can be seen in constipation.  Otherwise no acute abnormality identified in the abdomen and pelvis.  Assessment:  48 y.o. yo Female with a problem list including...  Patient Active Problem List   Diagnosis Date Noted  . Acute appendicitis 04/01/2018  . Migraine 11/05/2017  . RAD (reactive airway disease), moderate persistent, uncomplicated 62/13/0865  . Acute cystitis without hematuria   . Change in mental status 10/26/2015  . Nonepileptic episode ([place]) 10/26/2015  . Adult BMI 30+ 10/18/2015  . Atopic rhinitis 10/18/2015  . Reactive airway disease without complication 78/46/9629  .  Recurrent major depressive disorder, in partial remission ([place]) 10/18/2015  . Snoring 10/18/2015  . Other epilepsy, intractable, without status epilepticus ([place]) 10/04/2015  . Spells of speech arrest 08/10/2015  . Shaking spells 08/10/2015  . Apneic spell 08/10/2015  . Primary osteoarthritis involving multiple joints 07/25/2015  . Hot flashes 07/25/2015  . Chronic pain of both shoulders 06/26/2015  . Spondylosis of cervical region without myelopathy or  radiculopathy 06/26/2015  . Pseudoseizure   . Status epilepticus due to refractory epilepsy ([place]) 06/22/2015  . Chronic nonintractable headache 06/04/2015  . Blurry vision, right eye 05/18/2015  . Depression, major, single episode, complete remission ([place]) 05/18/2015  . Renal calculus 05/18/2015  . Pseudoseizures 05/11/2015  . Migraines 05/11/2015  . Abdominal pain 05/10/2015  . Allergy to bee sting 12/01/2014  . Pain in toe of left foot 12/01/2014  . Nausea 12/01/2014  . Gastroesophageal reflux disease without esophagitis 12/01/2014  . Difficulty breathing 10/19/2014  . Seizures ([place]) 05/17/2014  . Respiratory failure, acute ([place]) 05/16/2014  . Depression, unspecified depression type 05/14/2014  . Headache disorder 04/13/2014  . Spells 04/13/2014  . Anxiety 01/07/2014  . Depression 01/07/2014  . Acute headache 12/02/2013  . Rotator cuff (capsule) sprain and strain 11/27/2013  . Midline low back pain without sciatica 10/29/2013  . Localized swelling, mass and lump, neck 10/29/2013  . Seizure disorder ([place]) 10/29/2013  . COPD (chronic obstructive pulmonary disease) ([place])   . Emphysema/COPD ([place])   . Sleep apnea   . Stroke ([place])   . Pain, chronic 07/11/2013    presents to clinic for post-op follow-up evaluation, doing well 2 weeks s/p laparoscopic appendectomy Rosana Hoes, 04/01/2018).  Plan:              - advance diet as tolerated              - okay to submerge incisions under water (baths, swimming) prn             - gradually resume all activities without restrictions over next 2 weeks             - apply sunblock particularly to incisions with sun exposure to reduce pigmentation of scars             - return to clinic as needed, instructed to call office if any questions or concerns  All of the above recommendations were discussed with the patient, and all of patient's questions were answered to her expressed satisfaction.  -- [name], MD, [place]: [facility] - Partnering for exceptional care. Office: [phone]

## 2018-06-12 DIAGNOSIS — G43119 Migraine with aura, intractable, without status migrainosus: Secondary | ICD-10-CM | POA: Diagnosis not present

## 2018-06-20 DIAGNOSIS — K219 Gastro-esophageal reflux disease without esophagitis: Secondary | ICD-10-CM | POA: Diagnosis not present

## 2018-06-20 DIAGNOSIS — R232 Flushing: Secondary | ICD-10-CM | POA: Diagnosis not present

## 2018-06-20 DIAGNOSIS — G40909 Epilepsy, unspecified, not intractable, without status epilepticus: Secondary | ICD-10-CM | POA: Diagnosis not present

## 2018-06-20 DIAGNOSIS — R7309 Other abnormal glucose: Secondary | ICD-10-CM | POA: Diagnosis not present

## 2018-06-20 DIAGNOSIS — G43119 Migraine with aura, intractable, without status migrainosus: Secondary | ICD-10-CM | POA: Diagnosis not present

## 2018-06-20 DIAGNOSIS — Z0000 Encounter for general adult medical examination without abnormal findings: Secondary | ICD-10-CM | POA: Diagnosis not present

## 2018-06-20 DIAGNOSIS — R221 Localized swelling, mass and lump, neck: Secondary | ICD-10-CM | POA: Diagnosis not present

## 2018-06-20 DIAGNOSIS — J4540 Moderate persistent asthma, uncomplicated: Secondary | ICD-10-CM | POA: Diagnosis not present

## 2018-06-20 DIAGNOSIS — Z1211 Encounter for screening for malignant neoplasm of colon: Secondary | ICD-10-CM | POA: Diagnosis not present

## 2018-06-21 ENCOUNTER — Other Ambulatory Visit: Payer: Self-pay | Admitting: Family Medicine

## 2018-06-21 DIAGNOSIS — N2889 Other specified disorders of kidney and ureter: Secondary | ICD-10-CM

## 2018-06-21 DIAGNOSIS — R221 Localized swelling, mass and lump, neck: Secondary | ICD-10-CM

## 2018-07-03 DIAGNOSIS — Z1211 Encounter for screening for malignant neoplasm of colon: Secondary | ICD-10-CM | POA: Diagnosis not present

## 2018-07-03 DIAGNOSIS — J4540 Moderate persistent asthma, uncomplicated: Secondary | ICD-10-CM | POA: Diagnosis not present

## 2018-07-03 DIAGNOSIS — K219 Gastro-esophageal reflux disease without esophagitis: Secondary | ICD-10-CM | POA: Diagnosis not present

## 2018-07-03 DIAGNOSIS — R748 Abnormal levels of other serum enzymes: Secondary | ICD-10-CM | POA: Diagnosis not present

## 2018-07-03 DIAGNOSIS — G40909 Epilepsy, unspecified, not intractable, without status epilepticus: Secondary | ICD-10-CM | POA: Diagnosis not present

## 2018-07-03 DIAGNOSIS — R51 Headache: Secondary | ICD-10-CM | POA: Diagnosis not present

## 2018-07-03 DIAGNOSIS — R000 Tachycardia, unspecified: Secondary | ICD-10-CM | POA: Diagnosis not present

## 2018-07-03 DIAGNOSIS — R072 Precordial pain: Secondary | ICD-10-CM | POA: Diagnosis not present

## 2018-07-03 DIAGNOSIS — F419 Anxiety disorder, unspecified: Secondary | ICD-10-CM | POA: Diagnosis not present

## 2018-07-17 DIAGNOSIS — R079 Chest pain, unspecified: Secondary | ICD-10-CM | POA: Diagnosis not present

## 2018-07-17 DIAGNOSIS — R0789 Other chest pain: Secondary | ICD-10-CM | POA: Diagnosis not present

## 2018-07-17 DIAGNOSIS — R7989 Other specified abnormal findings of blood chemistry: Secondary | ICD-10-CM | POA: Diagnosis not present

## 2018-07-23 ENCOUNTER — Ambulatory Visit
Admission: RE | Admit: 2018-07-23 | Discharge: 2018-07-23 | Disposition: A | Discharge status: Home / Self Care | Payer: Medicare HMO | Source: Ambulatory Visit | Attending: Family Medicine | Admitting: Family Medicine

## 2018-07-23 ENCOUNTER — Other Ambulatory Visit: Payer: Self-pay

## 2018-07-23 DIAGNOSIS — E041 Nontoxic single thyroid nodule: Secondary | ICD-10-CM | POA: Diagnosis not present

## 2018-07-23 DIAGNOSIS — N2889 Other specified disorders of kidney and ureter: Secondary | ICD-10-CM | POA: Insufficient documentation

## 2018-07-23 DIAGNOSIS — H9201 Otalgia, right ear: Secondary | ICD-10-CM | POA: Diagnosis not present

## 2018-07-23 DIAGNOSIS — R221 Localized swelling, mass and lump, neck: Secondary | ICD-10-CM

## 2018-07-23 DIAGNOSIS — G40909 Epilepsy, unspecified, not intractable, without status epilepticus: Secondary | ICD-10-CM | POA: Diagnosis not present

## 2018-07-23 DIAGNOSIS — N289 Disorder of kidney and ureter, unspecified: Secondary | ICD-10-CM | POA: Diagnosis not present

## 2018-07-23 DIAGNOSIS — R222 Localized swelling, mass and lump, trunk: Secondary | ICD-10-CM | POA: Diagnosis not present

## 2018-07-23 MED ORDER — GADOBUTROL 1 MMOL/ML IV SOLN
7.0000 mL | Freq: Once | INTRAVENOUS | Status: AC | PRN
  Administered 2018-07-23: 7 mL via INTRAVENOUS

## 2018-08-07 DIAGNOSIS — E041 Nontoxic single thyroid nodule: Secondary | ICD-10-CM | POA: Diagnosis not present

## 2018-08-09 ENCOUNTER — Ambulatory Visit: Payer: Medicare HMO

## 2018-08-09 ENCOUNTER — Other Ambulatory Visit: Payer: Self-pay

## 2018-08-15 DIAGNOSIS — E041 Nontoxic single thyroid nodule: Secondary | ICD-10-CM | POA: Diagnosis not present

## 2018-08-16 DIAGNOSIS — E042 Nontoxic multinodular goiter: Secondary | ICD-10-CM | POA: Diagnosis not present

## 2018-08-28 DIAGNOSIS — E042 Nontoxic multinodular goiter: Secondary | ICD-10-CM | POA: Diagnosis not present

## 2018-08-28 DIAGNOSIS — E041 Nontoxic single thyroid nodule: Secondary | ICD-10-CM | POA: Diagnosis not present

## 2018-09-03 DIAGNOSIS — Z7951 Long term (current) use of inhaled steroids: Secondary | ICD-10-CM | POA: Diagnosis not present

## 2018-09-03 DIAGNOSIS — E041 Nontoxic single thyroid nodule: Secondary | ICD-10-CM | POA: Diagnosis not present

## 2018-09-03 DIAGNOSIS — Z8673 Personal history of transient ischemic attack (TIA), and cerebral infarction without residual deficits: Secondary | ICD-10-CM | POA: Diagnosis not present

## 2018-09-03 DIAGNOSIS — F17210 Nicotine dependence, cigarettes, uncomplicated: Secondary | ICD-10-CM | POA: Diagnosis not present

## 2018-09-03 DIAGNOSIS — R1310 Dysphagia, unspecified: Secondary | ICD-10-CM | POA: Diagnosis not present

## 2018-09-03 DIAGNOSIS — G40909 Epilepsy, unspecified, not intractable, without status epilepticus: Secondary | ICD-10-CM | POA: Diagnosis not present

## 2018-09-03 DIAGNOSIS — J45909 Unspecified asthma, uncomplicated: Secondary | ICD-10-CM | POA: Diagnosis not present

## 2018-09-03 DIAGNOSIS — E042 Nontoxic multinodular goiter: Secondary | ICD-10-CM | POA: Diagnosis not present

## 2018-09-03 DIAGNOSIS — Z91041 Radiographic dye allergy status: Secondary | ICD-10-CM | POA: Diagnosis not present

## 2018-09-03 DIAGNOSIS — Z882 Allergy status to sulfonamides status: Secondary | ICD-10-CM | POA: Diagnosis not present

## 2018-09-06 DIAGNOSIS — Z1159 Encounter for screening for other viral diseases: Secondary | ICD-10-CM | POA: Diagnosis not present

## 2018-09-09 DIAGNOSIS — R1310 Dysphagia, unspecified: Secondary | ICD-10-CM | POA: Diagnosis not present

## 2018-09-09 DIAGNOSIS — J9859 Other diseases of mediastinum, not elsewhere classified: Secondary | ICD-10-CM | POA: Diagnosis not present

## 2018-09-09 DIAGNOSIS — E042 Nontoxic multinodular goiter: Secondary | ICD-10-CM | POA: Diagnosis not present

## 2018-09-09 DIAGNOSIS — C73 Malignant neoplasm of thyroid gland: Secondary | ICD-10-CM | POA: Diagnosis not present

## 2018-09-09 DIAGNOSIS — Z87891 Personal history of nicotine dependence: Secondary | ICD-10-CM | POA: Diagnosis not present

## 2018-09-09 DIAGNOSIS — G40909 Epilepsy, unspecified, not intractable, without status epilepticus: Secondary | ICD-10-CM | POA: Diagnosis not present

## 2018-09-09 DIAGNOSIS — Z8673 Personal history of transient ischemic attack (TIA), and cerebral infarction without residual deficits: Secondary | ICD-10-CM | POA: Diagnosis not present

## 2018-09-09 DIAGNOSIS — E049 Nontoxic goiter, unspecified: Secondary | ICD-10-CM | POA: Diagnosis not present

## 2018-09-09 DIAGNOSIS — Z7951 Long term (current) use of inhaled steroids: Secondary | ICD-10-CM | POA: Diagnosis not present

## 2018-09-09 DIAGNOSIS — J45909 Unspecified asthma, uncomplicated: Secondary | ICD-10-CM | POA: Diagnosis not present

## 2018-09-26 DIAGNOSIS — C73 Malignant neoplasm of thyroid gland: Secondary | ICD-10-CM | POA: Diagnosis not present

## 2018-09-27 DIAGNOSIS — J029 Acute pharyngitis, unspecified: Secondary | ICD-10-CM | POA: Diagnosis not present

## 2018-09-27 DIAGNOSIS — R05 Cough: Secondary | ICD-10-CM | POA: Diagnosis not present

## 2018-10-11 DIAGNOSIS — R635 Abnormal weight gain: Secondary | ICD-10-CM | POA: Diagnosis not present

## 2018-10-11 DIAGNOSIS — M5489 Other dorsalgia: Secondary | ICD-10-CM | POA: Diagnosis not present

## 2018-10-11 DIAGNOSIS — E039 Hypothyroidism, unspecified: Secondary | ICD-10-CM | POA: Diagnosis not present

## 2018-10-11 DIAGNOSIS — M47814 Spondylosis without myelopathy or radiculopathy, thoracic region: Secondary | ICD-10-CM | POA: Diagnosis not present

## 2018-11-05 DIAGNOSIS — M62830 Muscle spasm of back: Secondary | ICD-10-CM | POA: Diagnosis not present

## 2018-11-05 DIAGNOSIS — M9901 Segmental and somatic dysfunction of cervical region: Secondary | ICD-10-CM | POA: Diagnosis not present

## 2018-11-05 DIAGNOSIS — M9902 Segmental and somatic dysfunction of thoracic region: Secondary | ICD-10-CM | POA: Diagnosis not present

## 2018-11-05 DIAGNOSIS — M546 Pain in thoracic spine: Secondary | ICD-10-CM | POA: Diagnosis not present

## 2018-11-06 DIAGNOSIS — M546 Pain in thoracic spine: Secondary | ICD-10-CM | POA: Diagnosis not present

## 2018-11-06 DIAGNOSIS — M9901 Segmental and somatic dysfunction of cervical region: Secondary | ICD-10-CM | POA: Diagnosis not present

## 2018-11-06 DIAGNOSIS — M62830 Muscle spasm of back: Secondary | ICD-10-CM | POA: Diagnosis not present

## 2018-11-06 DIAGNOSIS — M9902 Segmental and somatic dysfunction of thoracic region: Secondary | ICD-10-CM | POA: Diagnosis not present

## 2018-11-07 DIAGNOSIS — M9902 Segmental and somatic dysfunction of thoracic region: Secondary | ICD-10-CM | POA: Diagnosis not present

## 2018-11-07 DIAGNOSIS — M9901 Segmental and somatic dysfunction of cervical region: Secondary | ICD-10-CM | POA: Diagnosis not present

## 2018-11-07 DIAGNOSIS — M62830 Muscle spasm of back: Secondary | ICD-10-CM | POA: Diagnosis not present

## 2018-11-07 DIAGNOSIS — M546 Pain in thoracic spine: Secondary | ICD-10-CM | POA: Diagnosis not present

## 2018-11-08 DIAGNOSIS — Z111 Encounter for screening for respiratory tuberculosis: Secondary | ICD-10-CM | POA: Diagnosis not present

## 2018-11-08 DIAGNOSIS — E890 Postprocedural hypothyroidism: Secondary | ICD-10-CM | POA: Diagnosis not present

## 2018-11-12 DIAGNOSIS — M62830 Muscle spasm of back: Secondary | ICD-10-CM | POA: Diagnosis not present

## 2018-11-12 DIAGNOSIS — M9902 Segmental and somatic dysfunction of thoracic region: Secondary | ICD-10-CM | POA: Diagnosis not present

## 2018-11-12 DIAGNOSIS — M9901 Segmental and somatic dysfunction of cervical region: Secondary | ICD-10-CM | POA: Diagnosis not present

## 2018-11-12 DIAGNOSIS — M546 Pain in thoracic spine: Secondary | ICD-10-CM | POA: Diagnosis not present

## 2018-11-13 DIAGNOSIS — M62830 Muscle spasm of back: Secondary | ICD-10-CM | POA: Diagnosis not present

## 2018-11-13 DIAGNOSIS — M546 Pain in thoracic spine: Secondary | ICD-10-CM | POA: Diagnosis not present

## 2018-11-13 DIAGNOSIS — M9901 Segmental and somatic dysfunction of cervical region: Secondary | ICD-10-CM | POA: Diagnosis not present

## 2018-11-13 DIAGNOSIS — M9902 Segmental and somatic dysfunction of thoracic region: Secondary | ICD-10-CM | POA: Diagnosis not present

## 2018-11-14 DIAGNOSIS — M62830 Muscle spasm of back: Secondary | ICD-10-CM | POA: Diagnosis not present

## 2018-11-14 DIAGNOSIS — M9902 Segmental and somatic dysfunction of thoracic region: Secondary | ICD-10-CM | POA: Diagnosis not present

## 2018-11-14 DIAGNOSIS — M546 Pain in thoracic spine: Secondary | ICD-10-CM | POA: Diagnosis not present

## 2018-11-14 DIAGNOSIS — M9901 Segmental and somatic dysfunction of cervical region: Secondary | ICD-10-CM | POA: Diagnosis not present

## 2018-11-18 DIAGNOSIS — M9902 Segmental and somatic dysfunction of thoracic region: Secondary | ICD-10-CM | POA: Diagnosis not present

## 2018-11-18 DIAGNOSIS — M62830 Muscle spasm of back: Secondary | ICD-10-CM | POA: Diagnosis not present

## 2018-11-18 DIAGNOSIS — M9901 Segmental and somatic dysfunction of cervical region: Secondary | ICD-10-CM | POA: Diagnosis not present

## 2018-11-18 DIAGNOSIS — M546 Pain in thoracic spine: Secondary | ICD-10-CM | POA: Diagnosis not present

## 2018-11-25 DIAGNOSIS — M9902 Segmental and somatic dysfunction of thoracic region: Secondary | ICD-10-CM | POA: Diagnosis not present

## 2018-11-25 DIAGNOSIS — M62830 Muscle spasm of back: Secondary | ICD-10-CM | POA: Diagnosis not present

## 2018-11-25 DIAGNOSIS — M546 Pain in thoracic spine: Secondary | ICD-10-CM | POA: Diagnosis not present

## 2018-11-25 DIAGNOSIS — M9901 Segmental and somatic dysfunction of cervical region: Secondary | ICD-10-CM | POA: Diagnosis not present

## 2018-11-26 DIAGNOSIS — R748 Abnormal levels of other serum enzymes: Secondary | ICD-10-CM | POA: Diagnosis not present

## 2018-11-26 DIAGNOSIS — Z01818 Encounter for other preprocedural examination: Secondary | ICD-10-CM | POA: Diagnosis not present

## 2018-11-26 DIAGNOSIS — R1084 Generalized abdominal pain: Secondary | ICD-10-CM | POA: Diagnosis not present

## 2018-11-26 DIAGNOSIS — R072 Precordial pain: Secondary | ICD-10-CM | POA: Diagnosis not present

## 2018-11-26 DIAGNOSIS — K219 Gastro-esophageal reflux disease without esophagitis: Secondary | ICD-10-CM | POA: Diagnosis not present

## 2018-11-26 DIAGNOSIS — J4540 Moderate persistent asthma, uncomplicated: Secondary | ICD-10-CM | POA: Diagnosis not present

## 2018-11-26 DIAGNOSIS — F419 Anxiety disorder, unspecified: Secondary | ICD-10-CM | POA: Diagnosis not present

## 2018-11-26 DIAGNOSIS — Z1211 Encounter for screening for malignant neoplasm of colon: Secondary | ICD-10-CM | POA: Diagnosis not present

## 2018-11-27 DIAGNOSIS — M546 Pain in thoracic spine: Secondary | ICD-10-CM | POA: Diagnosis not present

## 2018-11-27 DIAGNOSIS — M9901 Segmental and somatic dysfunction of cervical region: Secondary | ICD-10-CM | POA: Diagnosis not present

## 2018-11-27 DIAGNOSIS — M62830 Muscle spasm of back: Secondary | ICD-10-CM | POA: Diagnosis not present

## 2018-11-27 DIAGNOSIS — M9902 Segmental and somatic dysfunction of thoracic region: Secondary | ICD-10-CM | POA: Diagnosis not present

## 2018-11-28 DIAGNOSIS — M546 Pain in thoracic spine: Secondary | ICD-10-CM | POA: Diagnosis not present

## 2018-11-28 DIAGNOSIS — M9902 Segmental and somatic dysfunction of thoracic region: Secondary | ICD-10-CM | POA: Diagnosis not present

## 2018-11-28 DIAGNOSIS — M9901 Segmental and somatic dysfunction of cervical region: Secondary | ICD-10-CM | POA: Diagnosis not present

## 2018-11-28 DIAGNOSIS — M62830 Muscle spasm of back: Secondary | ICD-10-CM | POA: Diagnosis not present

## 2018-12-02 DIAGNOSIS — M62830 Muscle spasm of back: Secondary | ICD-10-CM | POA: Diagnosis not present

## 2018-12-02 DIAGNOSIS — M9902 Segmental and somatic dysfunction of thoracic region: Secondary | ICD-10-CM | POA: Diagnosis not present

## 2018-12-02 DIAGNOSIS — M546 Pain in thoracic spine: Secondary | ICD-10-CM | POA: Diagnosis not present

## 2018-12-02 DIAGNOSIS — M9901 Segmental and somatic dysfunction of cervical region: Secondary | ICD-10-CM | POA: Diagnosis not present

## 2018-12-04 DIAGNOSIS — M62830 Muscle spasm of back: Secondary | ICD-10-CM | POA: Diagnosis not present

## 2018-12-04 DIAGNOSIS — M9901 Segmental and somatic dysfunction of cervical region: Secondary | ICD-10-CM | POA: Diagnosis not present

## 2018-12-04 DIAGNOSIS — M9902 Segmental and somatic dysfunction of thoracic region: Secondary | ICD-10-CM | POA: Diagnosis not present

## 2018-12-04 DIAGNOSIS — M546 Pain in thoracic spine: Secondary | ICD-10-CM | POA: Diagnosis not present

## 2018-12-09 DIAGNOSIS — Z01818 Encounter for other preprocedural examination: Secondary | ICD-10-CM | POA: Diagnosis not present

## 2018-12-12 DIAGNOSIS — K228 Other specified diseases of esophagus: Secondary | ICD-10-CM | POA: Diagnosis not present

## 2018-12-12 DIAGNOSIS — K640 First degree hemorrhoids: Secondary | ICD-10-CM | POA: Diagnosis not present

## 2018-12-12 DIAGNOSIS — K311 Adult hypertrophic pyloric stenosis: Secondary | ICD-10-CM | POA: Diagnosis not present

## 2018-12-12 DIAGNOSIS — Z1211 Encounter for screening for malignant neoplasm of colon: Secondary | ICD-10-CM | POA: Diagnosis not present

## 2018-12-12 DIAGNOSIS — K635 Polyp of colon: Secondary | ICD-10-CM | POA: Diagnosis not present

## 2018-12-12 DIAGNOSIS — K2100 Gastro-esophageal reflux disease with esophagitis, without bleeding: Secondary | ICD-10-CM | POA: Diagnosis not present

## 2018-12-12 DIAGNOSIS — K5730 Diverticulosis of large intestine without perforation or abscess without bleeding: Secondary | ICD-10-CM | POA: Diagnosis not present

## 2018-12-12 DIAGNOSIS — K222 Esophageal obstruction: Secondary | ICD-10-CM | POA: Diagnosis not present

## 2018-12-12 DIAGNOSIS — K2950 Unspecified chronic gastritis without bleeding: Secondary | ICD-10-CM | POA: Diagnosis not present

## 2018-12-12 DIAGNOSIS — D125 Benign neoplasm of sigmoid colon: Secondary | ICD-10-CM | POA: Diagnosis not present

## 2018-12-16 DIAGNOSIS — M9901 Segmental and somatic dysfunction of cervical region: Secondary | ICD-10-CM | POA: Diagnosis not present

## 2018-12-16 DIAGNOSIS — M62830 Muscle spasm of back: Secondary | ICD-10-CM | POA: Diagnosis not present

## 2018-12-16 DIAGNOSIS — M9902 Segmental and somatic dysfunction of thoracic region: Secondary | ICD-10-CM | POA: Diagnosis not present

## 2018-12-16 DIAGNOSIS — M546 Pain in thoracic spine: Secondary | ICD-10-CM | POA: Diagnosis not present

## 2018-12-23 DIAGNOSIS — M9901 Segmental and somatic dysfunction of cervical region: Secondary | ICD-10-CM | POA: Diagnosis not present

## 2018-12-23 DIAGNOSIS — M546 Pain in thoracic spine: Secondary | ICD-10-CM | POA: Diagnosis not present

## 2018-12-23 DIAGNOSIS — M62830 Muscle spasm of back: Secondary | ICD-10-CM | POA: Diagnosis not present

## 2018-12-23 DIAGNOSIS — M9902 Segmental and somatic dysfunction of thoracic region: Secondary | ICD-10-CM | POA: Diagnosis not present

## 2019-01-01 DIAGNOSIS — M546 Pain in thoracic spine: Secondary | ICD-10-CM | POA: Diagnosis not present

## 2019-01-01 DIAGNOSIS — M62830 Muscle spasm of back: Secondary | ICD-10-CM | POA: Diagnosis not present

## 2019-01-01 DIAGNOSIS — M9901 Segmental and somatic dysfunction of cervical region: Secondary | ICD-10-CM | POA: Diagnosis not present

## 2019-01-01 DIAGNOSIS — M9902 Segmental and somatic dysfunction of thoracic region: Secondary | ICD-10-CM | POA: Diagnosis not present

## 2019-01-09 DIAGNOSIS — E039 Hypothyroidism, unspecified: Secondary | ICD-10-CM | POA: Diagnosis not present

## 2019-01-13 DIAGNOSIS — K2950 Unspecified chronic gastritis without bleeding: Secondary | ICD-10-CM | POA: Diagnosis not present

## 2019-01-13 DIAGNOSIS — R748 Abnormal levels of other serum enzymes: Secondary | ICD-10-CM | POA: Diagnosis not present

## 2019-01-13 DIAGNOSIS — K3189 Other diseases of stomach and duodenum: Secondary | ICD-10-CM | POA: Diagnosis not present

## 2019-01-13 DIAGNOSIS — G40909 Epilepsy, unspecified, not intractable, without status epilepticus: Secondary | ICD-10-CM | POA: Diagnosis not present

## 2019-01-13 DIAGNOSIS — K2100 Gastro-esophageal reflux disease with esophagitis, without bleeding: Secondary | ICD-10-CM | POA: Diagnosis not present

## 2019-01-13 DIAGNOSIS — F419 Anxiety disorder, unspecified: Secondary | ICD-10-CM | POA: Diagnosis not present

## 2019-02-18 DIAGNOSIS — R200 Anesthesia of skin: Secondary | ICD-10-CM | POA: Diagnosis not present

## 2019-02-18 DIAGNOSIS — G43109 Migraine with aura, not intractable, without status migrainosus: Secondary | ICD-10-CM | POA: Diagnosis not present

## 2019-02-18 DIAGNOSIS — Z8669 Personal history of other diseases of the nervous system and sense organs: Secondary | ICD-10-CM | POA: Diagnosis not present

## 2019-02-23 DIAGNOSIS — Z209 Contact with and (suspected) exposure to unspecified communicable disease: Secondary | ICD-10-CM | POA: Diagnosis not present

## 2019-02-23 DIAGNOSIS — Z03818 Encounter for observation for suspected exposure to other biological agents ruled out: Secondary | ICD-10-CM | POA: Diagnosis not present

## 2019-05-16 ENCOUNTER — Other Ambulatory Visit: Payer: Self-pay

## 2019-05-16 ENCOUNTER — Emergency Department
Admission: EM | Admit: 2019-05-16 | Discharge: 2019-05-16 | Disposition: A | Discharge status: Home / Self Care | Payer: Medicare Other | Attending: Emergency Medicine | Admitting: Emergency Medicine

## 2019-05-16 ENCOUNTER — Emergency Department: Payer: Medicare Other

## 2019-05-16 ENCOUNTER — Encounter: Payer: Self-pay | Admitting: Emergency Medicine

## 2019-05-16 DIAGNOSIS — R0602 Shortness of breath: Secondary | ICD-10-CM | POA: Diagnosis not present

## 2019-05-16 DIAGNOSIS — Z79899 Other long term (current) drug therapy: Secondary | ICD-10-CM | POA: Insufficient documentation

## 2019-05-16 DIAGNOSIS — F17290 Nicotine dependence, other tobacco product, uncomplicated: Secondary | ICD-10-CM | POA: Insufficient documentation

## 2019-05-16 DIAGNOSIS — J449 Chronic obstructive pulmonary disease, unspecified: Secondary | ICD-10-CM | POA: Insufficient documentation

## 2019-05-16 MED ORDER — ALBUTEROL SULFATE (2.5 MG/3ML) 0.083% IN NEBU
5.0000 mg | INHALATION_SOLUTION | Freq: Once | RESPIRATORY_TRACT | Status: AC
  Administered 2019-05-16: 5 mg via RESPIRATORY_TRACT
  Filled 2019-05-16: qty 6

## 2019-05-16 MED ORDER — ALBUTEROL SULFATE (2.5 MG/3ML) 0.083% IN NEBU: 2.5000 mg | INHALATION_SOLUTION | Freq: Four times a day (QID) | RESPIRATORY_TRACT | 1 refills | Status: AC | PRN

## 2019-05-16 MED ORDER — COMPRESSOR/NEBULIZER MISC: 0 refills | Status: AC

## 2019-05-16 NOTE — ED Notes (Signed)
Pt reports feeling much better and ready to go home.

## 2019-05-16 NOTE — ED Notes (Signed)
Pt completed her breathing treatment and states that it did help and she is no longer wheezing

## 2019-05-16 NOTE — ED Triage Notes (Deleted)
Pt with shortness of breath that started at 1410. Pt took 2 puffs of ventolin inhaler prior to coming. Pt with hx asthma.

## 2019-05-16 NOTE — ED Provider Notes (Signed)
[facility] Emergency Department Provider Note  Time seen: 4:35 PM  I have reviewed the triage vital signs and the nursing notes.   HISTORY  Chief Complaint Shortness of Breath   HPI [name] is a 49 y.o. female with a past medical history of anxiety, arthritis, asthma, gastric reflux,  presents to the emergency department  with elevated blood pressure and shortness of breath.  According to the patient she saw her doctor this morning around 11:00 her blood pressure was normal at that time.  She went to [facility] to pick up her prescriptions and she began feeling somewhat unwell, she was worried her blood pressure could be low so she checked it at the machine and it was [id number] systolic.  Patient became concerned because it had risen 10 points since she left her doctor so she called her doctor and went back to the doctor's office.  States when she got back to the doctor's office it was 150 and the patient was complaining of shortness of breath.  Patient used her inhaler and came to the emergency department.  Here the patient appears well.  States she is feeling much better.  Denies any shortness of breath at this time.  Patient denies any chest pain or pressure.  Overall the patient appears well, no distress.  Past Medical History:  Diagnosis Date  . Anxiety   . Arthritis   . Asthma   . Bilateral shoulder pain   . Chronic neck pain   . Depression   . GERD (gastroesophageal reflux disease)   . Migraines   . Nasal septal perforation   . Seizure ([place])   . Sleep apnea   . Stroke [facility])     Patient Active Problem List   Diagnosis Date Noted  . Migraine 11/05/2017  . RAD (reactive airway disease), moderate persistent, uncomplicated [id number]  . Acute cystitis without hematuria   . Change in mental status 10/26/2015  . Nonepileptic episode ([place]) 10/26/2015  . Adult BMI 30+ 10/18/2015  . Atopic rhinitis 10/18/2015  . Reactive airway disease without complication  [id number]  . Recurrent major depressive disorder, in partial remission ([place]) 10/18/2015  . Snoring 10/18/2015  . Other epilepsy, intractable, without status epilepticus ([place]) 10/04/2015  . Spells of speech arrest 08/10/2015  . Shaking spells 08/10/2015  . Apneic spell 08/10/2015  . Primary osteoarthritis involving multiple joints 07/25/2015  . Hot flashes 07/25/2015  . Chronic pain of both shoulders 06/26/2015  . Spondylosis of cervical region without myelopathy or radiculopathy 06/26/2015  . Pseudoseizure   . Status epilepticus due to refractory epilepsy ([place]) 06/22/2015  . Chronic nonintractable headache 06/04/2015  . Blurry vision, right eye 05/18/2015  . Depression, major, single episode, complete remission ([place]) 05/18/2015  . Renal calculus 05/18/2015  . Pseudoseizures 05/11/2015  . Migraines 05/11/2015  . Abdominal pain 05/10/2015  . Allergy to bee sting 12/01/2014  . Pain in toe of left foot 12/01/2014  . Nausea 12/01/2014  . Gastroesophageal reflux disease without esophagitis 12/01/2014  . Difficulty breathing 10/19/2014  . Seizures ([place]) 05/17/2014  . Respiratory failure, acute ([place]) 05/16/2014  . Depression, unspecified depression type 05/14/2014  . Headache disorder 04/13/2014  . Spells 04/13/2014  . Anxiety 01/07/2014  . Depression 01/07/2014  . Acute headache 12/02/2013  . Rotator cuff (capsule) sprain and strain 11/27/2013  . Midline low back pain without sciatica 10/29/2013  . Localized swelling, mass and lump, neck 10/29/2013  . Seizure disorder ([place]) 10/29/2013  . COPD (chronic obstructive  pulmonary disease) ([place])   . Emphysema/COPD ([place])   . Sleep apnea   . Stroke ([place])   . Pain, chronic 07/11/2013    Past Surgical History:  Procedure Laterality Date  . ABDOMINAL HYSTERECTOMY     partial  . CESAREAN SECTION  1993  . LAPAROSCOPIC APPENDECTOMY N/A 04/01/2018   Procedure: APPENDECTOMY LAPAROSCOPIC;  Surgeon: Vickie Epley, MD;  Location: [facility] ORS;   Service: General;  Laterality: N/A;  . PARTIAL HYSTERECTOMY  1995  . TONSILLECTOMY      Prior to Admission medications   Medication Sig Start Date End Date Taking? Authorizing Provider  acetaminophen (TYLENOL) 500 MG tablet Take 1,000 mg by mouth every 6 (six) hours as needed for mild pain or headache.    [provider]  albuterol (PROVENTIL HFA;VENTOLIN HFA) 108 (90 BASE) MCG/ACT inhaler Inhale 2 puffs into the lungs every 6 (six) hours as needed for wheezing or shortness of breath.     [provider]  amitriptyline (ELAVIL) 10 MG tablet Take 50 mg by mouth at bedtime. 11/30/17   [provider]  b complex vitamins capsule Take 1 capsule by mouth daily. 11/05/17   Sudini, Alveta Heimlich, MD  EPINEPHrine 0.3 mg/0.3 mL IJ SOAJ injection Inject into the muscle.    [provider]  magnesium oxide (MAG-OX) 400 MG tablet Take 400 mg by mouth daily.    [provider]  NON FORMULARY Use CBD oil    [provider]  oxyCODONE (OXY IR/ROXICODONE) 5 MG immediate release tablet Take 1 tablet (5 mg total) by mouth every 6 (six) hours as needed for severe pain or breakthrough pain. 04/02/18   Tylene Fantasia, PA-C  oxycodone (OXY-IR) 5 MG capsule Take by mouth.    [provider]  PARoxetine (PAXIL) 10 MG tablet Take 10 mg by mouth daily.    [provider]  polyethylene glycol (MIRALAX) packet Take 17 g by mouth daily. 04/04/18   Schuyler Amor, MD    Allergies  Allergen Reactions  . Bee Venom Anaphylaxis  . Shellfish Allergy Anaphylaxis  . Contrast Media [Iodinated Diagnostic Agents] Other (See Comments)    Unknown  Reaction:  Unknown   . Other Other (See Comments)    Unknown   . Penicillins Other (See Comments)    Reaction:  Seizures  Has patient had a PCN reaction causing immediate rash, facial/tongue/throat swelling, SOB or lightheadedness with hypotension: No Has patient had a PCN reaction causing severe rash involving mucus  membranes or skin necrosis: No Has patient had a PCN reaction that required hospitalization Yes Has patient had a PCN reaction occurring within the last 10 years: Yes If all of the above answers are "NO", then may proceed with Cephalosporin use.  . Sulfa Antibiotics Itching    Family History  Problem Relation Age of Onset  . Depression Maternal Grandmother   . Hypertension Maternal Grandmother   . Cancer Maternal Grandfather   . Lung cancer Mother   . Depression Mother   . Learning disabilities Sister   . Depression Sister   . Depression Paternal Uncle   . Hypertension Paternal Uncle   . Diabetes Neg Hx   . Breast cancer Neg Hx     Social History Social History   Tobacco Use  . Smoking status: Current Every Day Smoker    Types: E-cigarettes  . Smokeless tobacco: Never Used  Substance Use Topics  . Alcohol use: No  . Drug use: No  Review of Systems Constitutional: Negative for fever. Cardiovascular: Negative for chest pain. Respiratory: Negative for shortness of breath. Gastrointestinal: Negative for abdominal pain Musculoskeletal: Negative for musculoskeletal complaints Neurological: Negative for headache All other ROS negative  ____________________________________________   PHYSICAL EXAM:  VITAL SIGNS: ED Triage Vitals  Enc Vitals Group     BP 05/16/19 1442 117/81     Pulse Rate 05/16/19 1442 (!) 112     Resp --      Temp 05/16/19 1442 98 F (36.7 C)     Temp Source 05/16/19 1442 Oral     SpO2 05/16/19 1442 99 %     Weight 05/16/19 1443 183 lb (83 kg)     Height 05/16/19 1443 5\' 3"  (1.6 m)     Head Circumference --      Peak Flow --      Pain Score 05/16/19 1443 0     Pain Loc --      Pain Edu? --      Excl. in [place]? --    Constitutional: Alert and oriented. Well appearing and in no distress. Eyes: Normal exam ENT      Head: Normocephalic and atraumatic.      Mouth/Throat: Mucous membranes are moist. Cardiovascular: Normal rate, regular  rhythm Respiratory: Normal respiratory effort without tachypnea nor retractions. Breath sounds are clear Gastrointestinal: Soft and nontender. No distention. Musculoskeletal: Nontender with normal range of motion in all extremities. Neurologic:  Normal speech and language. No gross focal neurologic deficits  Skin:  Skin is warm, dry and intact.  Psychiatric: Mood and affect are normal.   ____________________________________________    EKG  EKG viewed and interpreted by myself shows a normal sinus rhythm at 93 bpm with a narrow QRS, normal axis, normal intervals, no concerning ST changes.  ____________________________________________    RADIOLOGY  Chest x-ray is clear  ____________________________________________   INITIAL IMPRESSION / ASSESSMENT AND PLAN / ED COURSE  Pertinent labs & imaging results that were available during my care of the patient were reviewed by me and considered in my medical decision making (see chart for details).   Patient presents to the emergency department for difficulty breathing and high blood pressure.  Overall patient appears well, no distress.  Clear lung sounds currently with a reassuring physical exam.  Does appear mildly anxious but otherwise well.  Patient states she feels well.  She states she is ready to go home.  They were unable to obtain blood work out front.  Patient does not wish to be stuck again.  As the patient appears well with overall reassuring physical exam reassuring vitals clear lung sounds normal EKG and chest x-ray believe the patient would be safe for discharge home with PCP follow-up.  Joya Sulkowski was evaluated in Emergency Department on 05/16/2019 for the symptoms described in the history of present illness. She was evaluated in the context of the global COVID-19 pandemic, which necessitated consideration that the patient might be at risk for infection with the SARS-CoV-2 virus that causes COVID-19. Institutional protocols  and algorithms that pertain to the evaluation of patients at risk for COVID-19 are in a state of rapid change based on information released by regulatory bodies including the CDC and federal and state organizations. These policies and algorithms were followed during the patient's care in the ED.  ____________________________________________   FINAL CLINICAL IMPRESSION(S) / ED DIAGNOSES  Dyspnea Hypertension   Harvest Dark, MD 05/16/19 1639

## 2019-05-16 NOTE — ED Triage Notes (Signed)
Pt with sob that started at 1415 today. Pt took 2 puffs of ventolin inhaler. Pt with hx of asthma. sats 99% in triage on RA.

## 2019-08-25 ENCOUNTER — Encounter: Payer: Self-pay | Admitting: Emergency Medicine

## 2019-08-25 ENCOUNTER — Other Ambulatory Visit: Payer: Self-pay

## 2019-08-25 ENCOUNTER — Emergency Department
Admission: EM | Admit: 2019-08-25 | Discharge: 2019-08-25 | Disposition: A | Discharge status: Home / Self Care | Payer: Medicare Other | Attending: Emergency Medicine | Admitting: Emergency Medicine

## 2019-08-25 ENCOUNTER — Emergency Department: Payer: Medicare Other

## 2019-08-25 DIAGNOSIS — R55 Syncope and collapse: Secondary | ICD-10-CM | POA: Diagnosis present

## 2019-08-25 DIAGNOSIS — Z5321 Procedure and treatment not carried out due to patient leaving prior to being seen by health care provider: Secondary | ICD-10-CM | POA: Insufficient documentation

## 2019-08-25 LAB — CBC
HCT: 37.5 % (ref 36.0–46.0)
Hemoglobin: 12.9 g/dL (ref 12.0–15.0)
MCH: 29.6 pg (ref 26.0–34.0)
MCHC: 34.4 g/dL (ref 30.0–36.0)
MCV: 86 fL (ref 80.0–100.0)
Platelets: 332 10*3/uL (ref 150–400)
RBC: 4.36 MIL/uL (ref 3.87–5.11)
RDW: 11.9 % (ref 11.5–15.5)
WBC: 6.6 10*3/uL (ref 4.0–10.5)
nRBC: 0 % (ref 0.0–0.2)

## 2019-08-25 LAB — BASIC METABOLIC PANEL
Anion gap: 7 (ref 5–15)
BUN: 14 mg/dL (ref 6–20)
CO2: 27 mmol/L (ref 22–32)
Calcium: 9 mg/dL (ref 8.9–10.3)
Chloride: 104 mmol/L (ref 98–111)
Creatinine, Ser: 0.87 mg/dL (ref 0.44–1.00)
GFR calc Af Amer: >60 mL/min (ref >60)
GFR calc non Af Amer: >60 mL/min (ref >60)
Glucose, Bld: 123 mg/dL — ABNORMAL HIGH (ref 70–99)
Potassium: 3.5 mmol/L (ref 3.5–5.1)
Sodium: 138 mmol/L (ref 135–145)

## 2019-08-25 LAB — TROPONIN I (HIGH SENSITIVITY): Troponin I (High Sensitivity): 2 ng/L (ref <18)

## 2019-08-25 NOTE — ED Triage Notes (Addendum)
Patient to ER for c/o syncopal episode while at [facility]. Patient states she has neck pain, but has been working for [employer] lately and has been driving a lot. Patient states she felt nauseated prior to syncopal episode, did not hit head with episode. Patient also reports taking a sliver of husband's Suboxone this am.  Patient states she has been seeing vision changes when attempting to read her book since this am (like letters in words stand out or there are shapes in the words).

## 2020-04-15 ENCOUNTER — Ambulatory Visit: Payer: Medicare Other | Admitting: Dermatology

## 2020-07-30 ENCOUNTER — Other Ambulatory Visit: Payer: Self-pay

## 2020-07-30 ENCOUNTER — Encounter: Payer: Self-pay | Admitting: Emergency Medicine

## 2020-07-30 ENCOUNTER — Ambulatory Visit: Admission: RE | Admit: 2020-07-30 | Payer: Self-pay | Source: Ambulatory Visit

## 2020-07-30 ENCOUNTER — Emergency Department
Admission: EM | Admit: 2020-07-30 | Discharge: 2020-07-30 | Disposition: A | Discharge status: Home / Self Care | Payer: 59 | Attending: Emergency Medicine | Admitting: Emergency Medicine

## 2020-07-30 ENCOUNTER — Emergency Department: Payer: 59

## 2020-07-30 DIAGNOSIS — M549 Dorsalgia, unspecified: Secondary | ICD-10-CM | POA: Insufficient documentation

## 2020-07-30 DIAGNOSIS — Z5321 Procedure and treatment not carried out due to patient leaving prior to being seen by health care provider: Secondary | ICD-10-CM | POA: Insufficient documentation

## 2020-07-30 DIAGNOSIS — R519 Headache, unspecified: Secondary | ICD-10-CM | POA: Insufficient documentation

## 2020-07-30 DIAGNOSIS — H538 Other visual disturbances: Secondary | ICD-10-CM | POA: Diagnosis not present

## 2020-07-30 DIAGNOSIS — R110 Nausea: Secondary | ICD-10-CM | POA: Insufficient documentation

## 2020-07-30 NOTE — ED Triage Notes (Signed)
Pt comes into the ED via POV c/o post op problem.  Pt had craniotomy completed on 07/20/20.  Pt states that 2 days ago she started having neck pain, right side facial pressure, and headaches.  Pt admits to some blurred vision and some nausea.  Pt states that she has called the neurologist and was supposed to f/u on the 5th.  Pt A&Ox4 at this time and is ambulatory to triage at this time.

## 2020-07-30 NOTE — ED Notes (Signed)
Pt informed front desk staff that she was leaving facility.  Unable to get e-signature obtained.  Pt seen ambulating well outside facility.

## 2020-10-27 ENCOUNTER — Other Ambulatory Visit: Payer: Self-pay | Admitting: Family Medicine

## 2020-10-27 DIAGNOSIS — Z1231 Encounter for screening mammogram for malignant neoplasm of breast: Secondary | ICD-10-CM

## 2020-12-01 ENCOUNTER — Other Ambulatory Visit: Payer: Self-pay | Admitting: Family Medicine

## 2020-12-01 DIAGNOSIS — G8929 Other chronic pain: Secondary | ICD-10-CM

## 2020-12-01 DIAGNOSIS — M62838 Other muscle spasm: Secondary | ICD-10-CM

## 2020-12-01 DIAGNOSIS — M25511 Pain in right shoulder: Secondary | ICD-10-CM

## 2020-12-01 DIAGNOSIS — M5030 Other cervical disc degeneration, unspecified cervical region: Secondary | ICD-10-CM

## 2020-12-01 DIAGNOSIS — M5134 Other intervertebral disc degeneration, thoracic region: Secondary | ICD-10-CM

## 2020-12-14 ENCOUNTER — Other Ambulatory Visit: Payer: Self-pay

## 2020-12-14 ENCOUNTER — Ambulatory Visit
Admission: RE | Admit: 2020-12-14 | Discharge: 2020-12-14 | Disposition: A | Discharge status: Home / Self Care | Payer: 59 | Source: Ambulatory Visit | Attending: Family Medicine | Admitting: Family Medicine

## 2020-12-14 DIAGNOSIS — M5134 Other intervertebral disc degeneration, thoracic region: Secondary | ICD-10-CM | POA: Diagnosis present

## 2020-12-14 DIAGNOSIS — M5030 Other cervical disc degeneration, unspecified cervical region: Secondary | ICD-10-CM

## 2020-12-14 DIAGNOSIS — M62838 Other muscle spasm: Secondary | ICD-10-CM

## 2020-12-14 DIAGNOSIS — G8929 Other chronic pain: Secondary | ICD-10-CM | POA: Diagnosis present

## 2020-12-14 DIAGNOSIS — M25511 Pain in right shoulder: Secondary | ICD-10-CM

## 2020-12-14 DIAGNOSIS — M25512 Pain in left shoulder: Secondary | ICD-10-CM | POA: Diagnosis present

## 2020-12-22 ENCOUNTER — Other Ambulatory Visit: Payer: Self-pay | Admitting: Orthopedic Surgery

## 2020-12-22 DIAGNOSIS — M75102 Unspecified rotator cuff tear or rupture of left shoulder, not specified as traumatic: Secondary | ICD-10-CM

## 2020-12-27 ENCOUNTER — Other Ambulatory Visit: Payer: Self-pay

## 2020-12-27 ENCOUNTER — Ambulatory Visit
Admission: RE | Admit: 2020-12-27 | Discharge: 2020-12-27 | Disposition: A | Discharge status: Home / Self Care | Payer: 59 | Source: Ambulatory Visit | Attending: Orthopedic Surgery | Admitting: Orthopedic Surgery

## 2020-12-27 DIAGNOSIS — M75102 Unspecified rotator cuff tear or rupture of left shoulder, not specified as traumatic: Secondary | ICD-10-CM | POA: Diagnosis present

## 2021-05-10 ENCOUNTER — Other Ambulatory Visit: Payer: Self-pay | Admitting: Family Medicine

## 2021-05-10 DIAGNOSIS — R221 Localized swelling, mass and lump, neck: Secondary | ICD-10-CM

## 2021-05-24 ENCOUNTER — Ambulatory Visit
Admission: RE | Admit: 2021-05-24 | Discharge: 2021-05-24 | Disposition: A | Discharge status: Home / Self Care | Payer: 59 | Source: Ambulatory Visit | Attending: Family Medicine | Admitting: Family Medicine

## 2021-05-24 DIAGNOSIS — R221 Localized swelling, mass and lump, neck: Secondary | ICD-10-CM | POA: Diagnosis present

## 2021-06-09 ENCOUNTER — Other Ambulatory Visit: Payer: Self-pay

## 2021-06-09 ENCOUNTER — Emergency Department
Admission: EM | Admit: 2021-06-09 | Discharge: 2021-06-09 | Disposition: A | Discharge status: Home / Self Care | Payer: 59 | Attending: Student in an Organized Health Care Education/Training Program | Admitting: Student in an Organized Health Care Education/Training Program

## 2021-06-09 ENCOUNTER — Emergency Department: Payer: 59

## 2021-06-09 DIAGNOSIS — N202 Calculus of kidney with calculus of ureter: Secondary | ICD-10-CM | POA: Diagnosis not present

## 2021-06-09 DIAGNOSIS — R109 Unspecified abdominal pain: Secondary | ICD-10-CM | POA: Diagnosis present

## 2021-06-09 DIAGNOSIS — N200 Calculus of kidney: Secondary | ICD-10-CM

## 2021-06-09 LAB — CBC
HCT: 41.8 % (ref 36.0–46.0)
Hemoglobin: 14 g/dL (ref 12.0–15.0)
MCH: 28 pg (ref 26.0–34.0)
MCHC: 33.5 g/dL (ref 30.0–36.0)
MCV: 83.6 fL (ref 80.0–100.0)
Platelets: 350 10*3/uL (ref 150–400)
RBC: 5 MIL/uL (ref 3.87–5.11)
RDW: 12.4 % (ref 11.5–15.5)
WBC: 8.3 10*3/uL (ref 4.0–10.5)
nRBC: 0 % (ref 0.0–0.2)

## 2021-06-09 LAB — POC URINE PREG, ED: Preg Test, Ur: NEGATIVE

## 2021-06-09 LAB — COMPREHENSIVE METABOLIC PANEL
ALT: 24 U/L (ref 0–44)
AST: 32 U/L (ref 15–41)
Albumin: 4.2 g/dL (ref 3.5–5.0)
Alkaline Phosphatase: 133 U/L — ABNORMAL HIGH (ref 38–126)
Anion gap: 12 (ref 5–15)
BUN: 16 mg/dL (ref 6–20)
CO2: 20 mmol/L — ABNORMAL LOW (ref 22–32)
Calcium: 9.4 mg/dL (ref 8.9–10.3)
Chloride: 106 mmol/L (ref 98–111)
Creatinine, Ser: 0.96 mg/dL (ref 0.44–1.00)
GFR, Estimated: >60 mL/min (ref >60)
Glucose, Bld: 163 mg/dL — ABNORMAL HIGH (ref 70–99)
Potassium: 4.2 mmol/L (ref 3.5–5.1)
Sodium: 138 mmol/L (ref 135–145)
Total Bilirubin: 0.5 mg/dL (ref 0.3–1.2)
Total Protein: 7.6 g/dL (ref 6.5–8.1)

## 2021-06-09 LAB — URINALYSIS, ROUTINE W REFLEX MICROSCOPIC
Bilirubin Urine: NEGATIVE
Glucose, UA: NEGATIVE mg/dL
Hgb urine dipstick: NEGATIVE
Ketones, ur: NEGATIVE mg/dL
Leukocytes,Ua: NEGATIVE
Nitrite: NEGATIVE
Protein, ur: 30 mg/dL — AB
Specific Gravity, Urine: 1.025 (ref 1.005–1.030)
pH: 6 (ref 5.0–8.0)

## 2021-06-09 LAB — LIPASE, BLOOD: Lipase: 47 U/L (ref 11–51)

## 2021-06-09 MED ORDER — KETOROLAC TROMETHAMINE 30 MG/ML IJ SOLN
15.0000 mg | Freq: Once | INTRAMUSCULAR | Status: AC
  Administered 2021-06-09: 15 mg via INTRAVENOUS
  Filled 2021-06-09: qty 1

## 2021-06-09 MED ORDER — HYDROMORPHONE HCL 1 MG/ML IJ SOLN
0.5000 mg | INTRAMUSCULAR | Status: DC | PRN
  Administered 2021-06-09: 0.5 mg via INTRAVENOUS
  Filled 2021-06-09: qty 1

## 2021-06-09 MED ORDER — HYDROMORPHONE HCL 1 MG/ML IJ SOLN
1.0000 mg | INTRAMUSCULAR | Status: DC | PRN
  Administered 2021-06-09: 0.5 mg via INTRAVENOUS
  Filled 2021-06-09: qty 1

## 2021-06-09 MED ORDER — TAMSULOSIN HCL 0.4 MG PO CAPS: 0.4000 mg | ORAL_CAPSULE | Freq: Every day | ORAL | 0 refills | Status: AC

## 2021-06-09 MED ORDER — ONDANSETRON 4 MG PO TBDP: 4.0000 mg | ORAL_TABLET | Freq: Three times a day (TID) | ORAL | 0 refills | Status: DC | PRN

## 2021-06-09 MED ORDER — OXYCODONE-ACETAMINOPHEN 5-325 MG PO TABS: 1.0000 | ORAL_TABLET | ORAL | 0 refills | Status: DC | PRN

## 2021-06-09 MED ORDER — SODIUM CHLORIDE 0.9 % IV BOLUS
1000.0000 mL | Freq: Once | INTRAVENOUS | Status: AC
  Administered 2021-06-09: 1000 mL via INTRAVENOUS

## 2021-06-09 MED ORDER — ONDANSETRON HCL 4 MG/2ML IJ SOLN
4.0000 mg | Freq: Once | INTRAMUSCULAR | Status: AC
  Administered 2021-06-09: 4 mg via INTRAVENOUS
  Filled 2021-06-09: qty 2

## 2021-06-09 NOTE — ED Notes (Signed)
Another 0.'5mg'$  of dilaudid given via IV as seen on MAR since 0.5 given in initial dose about 20 minutes ago.  ?

## 2021-06-09 NOTE — ED Notes (Signed)
AVS with prescriptions provided to and discussed with patient. Pt verbalizes understanding of discharge instructions and denies any questions or concerns at this time. Pt has ride home. Pt ambulated out of department independently with steady gait.  

## 2021-06-09 NOTE — ED Provider Notes (Signed)
? ?[facility] ?Provider Note ? ? ? Event Date/Time  ? First MD Initiated Contact with Patient 06/09/21 1310   ?  (approximate) ? ? ?History  ? ?Abdominal Pain ? ? ?HPI ? ?[name] is a 51 y.o. female   with a history of anxiety and chronic pain presents to the ER for evaluation of cute onset left flank pain abdominal pain.  Foley was sudden onset.  Having trouble standing still due to pain.  She is currently standing up bent over the ER bed yelling and discomfort.  Has never had symptoms like this before.  No fevers or chills.  Does feel nauseated related to the pain.  Denies any dysuria states is little bit darker than usual.  Denies any chest pain or shortness of breath. ? ?  ? ? ?Physical Exam  ? ?Triage Vital Signs: ?ED Triage Vitals [06/09/21 1254]  ?Enc Vitals Group  ?   BP (!) 141/86  ?   Pulse Rate 80  ?   Resp 20  ?   Temp 98.3 ?F (36.8 ?C)  ?   Temp src   ?   SpO2 96 %  ?   Weight 180 lb (81.6 kg)  ?   Height '5\' 3"'$  (1.6 m)  ?   Head Circumference   ?   Peak Flow   ?   Pain Score 10  ?   Pain Loc   ?   Pain Edu?   ?   Excl. in [place]?   ? ? ?Most recent vital signs: ?Vitals:  ? 06/09/21 1530 06/09/21 1600  ?BP: 140/75 (!) 137/95  ?Pulse: 96 78  ?Resp:  18  ?Temp:  98 ?F (36.7 ?C)  ?SpO2:  96%  ? ? ? ?Constitutional: Alert, uncomfortable appearing ?Eyes: Conjunctivae are normal.  ?Head: Atraumatic. ?Nose: No congestion/rhinnorhea. ?Mouth/Throat: Mucous membranes are moist.   ?Neck: Painless ROM.  ?Cardiovascular:   Good peripheral circulation. No m/g/r ?Respiratory: Normal respiratory effort.  No retractions. No crackles, no wheeze ?Gastrointestinal: Soft and nontender. + left cva ttp ?Musculoskeletal:  no deformity ?Neurologic:  MAE spontaneously. No gross focal neurologic deficits are appreciated.  ?Skin:  Skin is warm, dry and intact. No rash noted. ?Psychiatric: Mood and affect are normal. Speech and behavior are normal. ? ? ? ?ED Results / Procedures / Treatments   ? ?Labs ?(all labs ordered are listed, but only abnormal results are displayed) ?Labs Reviewed  ?COMPREHENSIVE METABOLIC PANEL - Abnormal; Notable for the following components:  ?    Result Value  ? CO2 20 (*)   ? Glucose, Bld 163 (*)   ? Alkaline Phosphatase 133 (*)   ? All other components within normal limits  ?URINALYSIS, ROUTINE W REFLEX MICROSCOPIC - Abnormal; Notable for the following components:  ? Color, Urine YELLOW (*)   ? APPearance CLOUDY (*)   ? Protein, ur 30 (*)   ? Bacteria, UA RARE (*)   ? All other components within normal limits  ?LIPASE, BLOOD  ?CBC  ?POC URINE PREG, ED  ?POC URINE PREG, ED  ? ? ? ?EKG ? ?ED ECG REPORT ?I, Merlyn Lot, the attending physician, personally viewed and interpreted this ECG. ? ? Date: 06/09/2021 ? EKG Time: 13:07 ? Rate: 80 ? Rhythm: sinus ? Axis: normal ? Intervals: normal ? ST&T Change: no stemi, no depressions ? ? ? ?RADIOLOGY ?Please see ED Course for my review and interpretation. ? ?I personally reviewed all radiographic images ordered to evaluate  for the above acute complaints and reviewed radiology reports and findings.  These findings were personally discussed with the patient.  Please see medical record for radiology report. ? ? ? ?PROCEDURES: ? ?Critical Care performed: No ? ?Procedures ? ? ?MEDICATIONS ORDERED IN ED: ?Medications  ?HYDROmorphone (DILAUDID) injection 1 mg (0.5 mg Intravenous Given 06/09/21 1359)  ?ondansetron [facility]) injection 4 mg (4 mg Intravenous Given 06/09/21 1337)  ?sodium chloride 0.9 % bolus 1,000 mL (0 mLs Intravenous Stopped 06/09/21 1607)  ?ketorolac (TORADOL) 30 MG/ML injection 15 mg (15 mg Intravenous Given 06/09/21 1502)  ? ? ? ?IMPRESSION / MDM / ASSESSMENT AND PLAN / ED COURSE  ?I reviewed the triage vital signs and the nursing notes. ?             ?               ? ?Differential diagnosis includes, but is not limited to, stone, diverticulitis, colitis, SBO, perforation, AAA, musculoskeletal ? ?Patient presenting to the ER  for evaluation symptoms as described above.  Clinically appears very uncomfortable will give IV Dilaudid will give IV fluids we will check blood work.  Have a high suspicion for stone will order CT renal. ? ? ?Clinical Course as of 06/09/21 1611  ?Thu Jun 09, 2021  ?1430 CT imaging on my review and interpretation appears to show evidence of left ureterolithiasis. [PR]  ?1513 Total Protein: 7.6 [PR]  ?1557 Patient reassessed feeling significantly improved.  Do anticipate patient will be appropriate for outpatient follow-up. [PR]  ?  ?Clinical Course User Index ?[PR] Merlyn Lot, MD  ? ? ? ?FINAL CLINICAL IMPRESSION(S) / ED DIAGNOSES  ? ?Final diagnoses:  ?Kidney stone  ? ? ? ?Rx / DC Orders  ? ?ED Discharge Orders   ? ?      Ordered  ?  oxyCODONE-acetaminophen (PERCOCET) 5-325 MG tablet  Every 4 hours PRN       ? 06/09/21 1604  ?  tamsulosin (FLOMAX) 0.4 MG CAPS capsule  Daily after supper       ? 06/09/21 1604  ?  ondansetron (ZOFRAN-ODT) 4 MG disintegrating tablet  Every 8 hours PRN       ? 06/09/21 1604  ? ?  ?  ? ?  ? ? ? ?Note:  This document was prepared using Dragon voice recognition software and may include unintentional dictation errors. ? ?  ?Merlyn Lot, MD ?06/09/21 1611 ? ?

## 2021-06-09 NOTE — ED Notes (Signed)
Pt leaving for CT.  

## 2021-06-09 NOTE — ED Notes (Signed)
Pt has urine specimen cup and is heading to restroom to try and provide sample. Pt offered wheelchair but pt declined. Pt groaning.  ?

## 2021-06-09 NOTE — ED Notes (Signed)
See triage note. Pt denies changes to urination or diarrhea. Pt reports tenderness and pain at mid L abdomen, L flank, and at L lower back. Pt reports pain and nausea since this morning. Pt grimacing and shifting back and forth in bed. Visitor at bedside. Pt's skin dry; resp irregular as pt breathing shallow at times and more heavily at other times due to pain. Pt placed on monitor.  ?

## 2021-06-09 NOTE — ED Triage Notes (Signed)
Pt with sudden onset LLQ pain this am that radiates to lower back. Pt with N/V. Pt denies urinary symptoms.  ?

## 2021-06-09 NOTE — ED Triage Notes (Signed)
Pt given 100 mcg of fentanyl and 4 mg of zofran IV by EMS ?

## 2021-06-09 NOTE — ED Notes (Signed)
Provider Quentin Cornwall notified via secure chat that pt denies change in pain level and states it has only made her sleepy. Awaiting reply.  ?

## 2021-06-09 NOTE — ED Notes (Signed)
Pt provided with ginger ale for PO challenge per MD request. Tolerating well.  ?

## 2021-06-13 ENCOUNTER — Other Ambulatory Visit: Payer: Self-pay

## 2021-06-13 DIAGNOSIS — N200 Calculus of kidney: Secondary | ICD-10-CM

## 2021-06-14 ENCOUNTER — Ambulatory Visit
Admission: RE | Admit: 2021-06-14 | Discharge: 2021-06-14 | Disposition: A | Discharge status: Home / Self Care | Payer: 59 | Source: Ambulatory Visit | Attending: Urology | Admitting: Urology

## 2021-06-14 ENCOUNTER — Other Ambulatory Visit
Admission: RE | Admit: 2021-06-14 | Discharge: 2021-06-14 | Disposition: A | Discharge status: Home / Self Care | Payer: 59 | Source: Home / Self Care | Attending: Urology | Admitting: Urology

## 2021-06-14 ENCOUNTER — Ambulatory Visit (INDEPENDENT_AMBULATORY_CARE_PROVIDER_SITE_OTHER): Payer: 59 | Admitting: Urology

## 2021-06-14 ENCOUNTER — Encounter: Payer: Self-pay | Admitting: Urology

## 2021-06-14 ENCOUNTER — Ambulatory Visit
Admission: RE | Admit: 2021-06-14 | Discharge: 2021-06-14 | Disposition: A | Discharge status: Home / Self Care | Payer: 59 | Attending: Urology | Admitting: Urology

## 2021-06-14 VITALS — BP 116/79 | HR 111 | Ht 63.0 in | Wt 187.0 lb

## 2021-06-14 DIAGNOSIS — N200 Calculus of kidney: Secondary | ICD-10-CM

## 2021-06-14 LAB — URINALYSIS, COMPLETE (UACMP) WITH MICROSCOPIC
Bilirubin Urine: NEGATIVE
Glucose, UA: 100 mg/dL — AB
Ketones, ur: NEGATIVE mg/dL
Leukocytes,Ua: NEGATIVE
Nitrite: NEGATIVE
Specific Gravity, Urine: 1.025 (ref 1.005–1.030)
pH: 5.5 (ref 5.0–8.0)

## 2021-06-14 MED ORDER — KETOROLAC TROMETHAMINE 10 MG PO TABS: 10.0000 mg | ORAL_TABLET | Freq: Four times a day (QID) | ORAL | 0 refills | Status: AC | PRN

## 2021-06-14 NOTE — Patient Instructions (Signed)
Kidney Stones ?Kidney stones are solid, rock-like deposits that form inside of the kidneys. The kidneys are a pair of organs that make urine. A kidney stone may form in a kidney and move into other parts of the urinary tract, including the tubes that connect the kidneys to the bladder (ureters), the bladder, and the tube that carries urine out of the body (urethra). As the stone moves through these areas, it can cause intense pain and block the flow of urine. ?Kidney stones are created when high levels of certain minerals are found in the urine. The stones are usually passed out of the body through urination, but in some cases, medical treatment may be needed to remove them. ?What are the causes? ?Kidney stones may be caused by: ?A condition in which certain glands produce too much parathyroid hormone (primary hyperparathyroidism), which causes too much calcium buildup in the blood. ?A buildup of uric acid crystals in the bladder (hyperuricosuria). Uric acid is a chemical that the body produces when you eat certain foods. It usually exits the body in the urine. ?Narrowing (stricture) of one or both of the ureters. ?A kidney blockage that is present at birth (congenital obstruction). ?Past surgery on the kidney or the ureters, such as gastric bypass surgery. ?What increases the risk? ?The following factors may make you more likely to develop this condition: ?Having had a kidney stone in the past. ?Having a family history of kidney stones. ?Not drinking enough water. ?Eating a diet that is high in protein, salt (sodium), or sugar. ?Being overweight or obese. ?What are the signs or symptoms? ?Symptoms of a kidney stone may include: ?Pain in the side of the abdomen, right below the ribs (flank pain). Pain usually spreads (radiates) to the groin. ?Needing to urinate frequently or urgently. ?Painful urination. ?Blood in the urine (hematuria). ?Nausea. ?Vomiting. ?Fever and chills. ?How is this diagnosed? ?This condition  may be diagnosed based on: ?Your symptoms and medical history. ?A physical exam. ?Blood tests. ?Urine tests. These may be done before and after the stone passes out of your body through urination. ?Imaging tests, such as a CT scan, abdominal X-ray, or ultrasound. ?A procedure to examine the inside of the bladder (cystoscopy). ?How is this treated? ?Treatment for kidney stones depends on the size, location, and makeup of the stones. Kidney stones will often pass out of the body through urination. You may need to: ?Increase your fluid intake to help pass the stone. In some cases, you may be given fluids through an IV and may need to be monitored at the hospital. ?Take medicine for pain. ?Make changes in your diet to help prevent kidney stones from coming back. ?Sometimes, medical procedures are needed to remove a kidney stone. This may involve: ?A procedure to break up kidney stones using: ?A focused beam of light (laser therapy). ?Shock waves (extracorporeal shock wave lithotripsy). ?Surgery to remove kidney stones. This may be needed if you have severe pain or have stones that block your urinary tract. ?Follow these instructions at home: ?Medicines ?Take over-the-counter and prescription medicines only as told by your health care provider. ?Ask your health care provider if the medicine prescribed to you requires you to avoid driving or using heavy machinery. ?Eating and drinking ?Drink enough fluid to keep your urine pale yellow. You may be instructed to drink at least 8-10 glasses of water each day. This will help you pass the kidney stone. ?If directed, change your diet. This may include: ?Limiting how much sodium  you eat. ?Eating more fruits and vegetables. ?Limiting how much animal protein--such as red meat, poultry, fish, and eggs--you eat. ?Follow instructions from your health care provider about eating or drinking restrictions. ?General instructions ?Collect urine samples as told by your health care provider.  You may need to collect a urine sample: ?24 hours after you pass the stone. ?8-12 weeks after passing the kidney stone, and every 6-12 months after that. ?Strain your urine every time you urinate, for as long as directed. Use the strainer that your health care provider recommends. ?Do not throw out the kidney stone after passing it. Keep the stone so it can be tested by your health care provider. Testing the makeup of your kidney stone may help prevent you from getting kidney stones in the future. ?Keep all follow-up visits as told by your health care provider. This is important. You may need follow-up X-rays or ultrasounds to make sure that your stone has passed. ?How is this prevented? ?To prevent another kidney stone: ?Drink enough fluid to keep your urine pale yellow. This is the best way to prevent kidney stones. ?Eat a healthy diet and follow recommendations from your health care provider about foods to avoid. You may be instructed to eat a low-protein diet. Recommendations vary depending on the type of kidney stone that you have. ?Maintain a healthy weight. ?Where to find more information ?[facility]): www.kidney.org ?[facility]): www.urologyhealth.org ?Contact a health care provider if: ?You have pain that gets worse or does not get better with medicine. ?Get help right away if: ?You have a fever or chills. ?You develop severe pain. ?You develop new abdominal pain. ?You faint. ?You are unable to urinate. ?Summary ?Kidney stones are solid, rock-like deposits that form inside of the kidneys. ?Kidney stones can cause nausea, vomiting, blood in the urine, abdominal pain, and the urge to urinate frequently. ?Treatment for kidney stones depends on the size, location, and makeup of the stones. Kidney stones will often pass out of the body through urination. ?Kidney stones can be prevented by drinking enough fluids, eating a healthy diet, and maintaining a healthy weight. ?This  information is not intended to replace advice given to you by your health care provider. Make sure you discuss any questions you have with your health care provider. ?Document Revised: 07/05/2018 Document Reviewed: 07/09/2018 ?Elsevier Patient Education ? [place]. ? ?

## 2021-06-14 NOTE — Progress Notes (Signed)
? ?06/14/21 ?12:59 PM  ? ?[name] ?06/12/1970 ?[id number] ? ?CC: Left UPJ stone ? ?HPI: ?51 year old female who presented to the ER on 06/09/2021 with left-sided flank pain and nausea.  CT showed a 3 mm left proximal ureteral stone with hydronephrosis, and urinalysis showed 6-10 RBCs and was contaminated with squamous cells but had no obvious evidence of infection.  She was discharged with Flomax and medical expulsive therapy. ? ?She continues to have some intermittent left-sided discomfort, but much better than when she went to the ER.  She denies any fevers, chills, or urinary symptoms ? ?Urinalysis today benign with 20-50 squamous cells, 11-20 WBCs, 0-5 RBCs, many bacteria, nitrite negative, no leukocytes. ? ? ?PMH: ?Past Medical History:  ?Diagnosis Date  ? Anxiety   ? Arthritis   ? Asthma   ? Bilateral shoulder pain   ? Chronic neck pain   ? Depression   ? GERD (gastroesophageal reflux disease)   ? Migraines   ? Nasal septal perforation   ? Seizure ([place])   ? Sleep apnea   ? Stroke [facility])   ? ? ?Surgical History: ?Past Surgical History:  ?Procedure Laterality Date  ? ABDOMINAL HYSTERECTOMY    ? partial  ? [place]  ? LAPAROSCOPIC APPENDECTOMY N/A 04/01/2018  ? Procedure: APPENDECTOMY LAPAROSCOPIC;  Surgeon: Vickie Epley, MD;  Location: [facility] ORS;  Service: General;  Laterality: N/A;  ? PARTIAL HYSTERECTOMY  1995  ? TONSILLECTOMY    ? ? ? ?Family History: ?Family History  ?Problem Relation Age of Onset  ? Depression Maternal Grandmother   ? Hypertension Maternal Grandmother   ? Cancer Maternal Grandfather   ? Lung cancer Mother   ? Depression Mother   ? Learning disabilities Sister   ? Depression Sister   ? Depression Paternal Uncle   ? Hypertension Paternal Uncle   ? Diabetes Neg Hx   ? Breast cancer Neg Hx   ? ? ?Social History:  reports that she has been smoking e-cigarettes. She has never used smokeless tobacco. She reports that she does not drink alcohol and does not use  drugs. ? ?Physical Exam: ?LMP  (LMP Unknown)   ? ?Constitutional:  Alert and oriented, No acute distress. ?Cardiovascular: No clubbing, cyanosis, or edema. ?Respiratory: Normal respiratory effort, no increased work of breathing. ?GI: Abdomen is soft, nontender, nondistended, no abdominal masses ? ?Laboratory Data: ?Reviewed, see HPI ? ?Pertinent Imaging: ?I have personally viewed and interpreted the CT and KUB with the CT showing a 3 mm left proximal ureteral stone, no obvious evidence of ureteral stones on KUB today, numerous pelvic calcifications most likely representing phleboliths ? ?Assessment & Plan:   ?51 year old female who presented to the ER with left-sided flank pain and nausea and CT showed a 3 mm left proximal ureteral stone with mild hydronephrosis.  Urinalysis was noninfected and she was discharged with medical expulsive therapy.  She has felt better since discharge.  Urinalysis today benign with no microscopic hematuria, and no definite evidence of previously visualized proximal ureteral stone on KUB today.  Unclear if she has passed her stone already, or if this has migrated distally, and is unable to be visualized on KUB secondary to the number of phleboliths in the pelvis. ? ?We discussed options at length including continuing medical expulsive therapy or ureteroscopy, laser lithotripsy, and stent placement.  Risk and benefits discussed at length.  She would like to continue medical expulsive therapy, and knows to call if she has refractory pain.  Stone not visible on KUB, so shockwave lithotripsy not an option. ? ?-Continue Flomax, Toradol sent to pharmacy for any refractory pain ?-Return precautions discussed at length ?-Discussed 90% + spontaneous passage rate for small stone, and that this is no longer seen in the proximal ureter on KUB ?-RTC 6 months KUB, sooner if problems ? ?Nickolas Madrid, MD ?06/14/2021 ? ?[facility] ?[address], [address] ?[place], [place]  [address] ?[phone] ? ? ?

## 2021-10-30 ENCOUNTER — Other Ambulatory Visit: Payer: Self-pay

## 2021-10-30 ENCOUNTER — Emergency Department (HOSPITAL_COMMUNITY): Payer: Medicare Other

## 2021-10-30 ENCOUNTER — Emergency Department (HOSPITAL_COMMUNITY)
Admission: EM | Admit: 2021-10-30 | Discharge: 2021-10-31 | Disposition: A | Discharge status: Home / Self Care | Payer: Medicare Other | Attending: Emergency Medicine | Admitting: Emergency Medicine

## 2021-10-30 DIAGNOSIS — N12 Tubulo-interstitial nephritis, not specified as acute or chronic: Secondary | ICD-10-CM

## 2021-10-30 DIAGNOSIS — R109 Unspecified abdominal pain: Secondary | ICD-10-CM | POA: Diagnosis present

## 2021-10-30 DIAGNOSIS — R000 Tachycardia, unspecified: Secondary | ICD-10-CM | POA: Diagnosis not present

## 2021-10-30 LAB — URINALYSIS, ROUTINE W REFLEX MICROSCOPIC
Bilirubin Urine: NEGATIVE
Glucose, UA: 50 mg/dL — AB
Ketones, ur: NEGATIVE mg/dL
Nitrite: NEGATIVE
Protein, ur: 100 mg/dL — AB
Specific Gravity, Urine: 1.027 (ref 1.005–1.030)
WBC, UA: >50 WBC/hpf — ABNORMAL HIGH (ref 0–5)
pH: 5 (ref 5.0–8.0)

## 2021-10-30 LAB — CBC WITH DIFFERENTIAL/PLATELET
Abs Immature Granulocytes: 0.06 10*3/uL (ref 0.00–0.07)
Basophils Absolute: 0.1 10*3/uL (ref 0.0–0.1)
Basophils Relative: 0 %
Eosinophils Absolute: 0 10*3/uL (ref 0.0–0.5)
Eosinophils Relative: 0 %
HCT: 39.2 % (ref 36.0–46.0)
Hemoglobin: 13.3 g/dL (ref 12.0–15.0)
Immature Granulocytes: 1 %
Lymphocytes Relative: 8 %
Lymphs Abs: 1 10*3/uL (ref 0.7–4.0)
MCH: 28.8 pg (ref 26.0–34.0)
MCHC: 33.9 g/dL (ref 30.0–36.0)
MCV: 84.8 fL (ref 80.0–100.0)
Monocytes Absolute: 0.9 10*3/uL (ref 0.1–1.0)
Monocytes Relative: 8 %
Neutro Abs: 9.6 10*3/uL — ABNORMAL HIGH (ref 1.7–7.7)
Neutrophils Relative %: 83 %
Platelets: 250 10*3/uL (ref 150–400)
RBC: 4.62 MIL/uL (ref 3.87–5.11)
RDW: 12.9 % (ref 11.5–15.5)
WBC: 11.6 10*3/uL — ABNORMAL HIGH (ref 4.0–10.5)
nRBC: 0 % (ref 0.0–0.2)

## 2021-10-30 LAB — COMPREHENSIVE METABOLIC PANEL
ALT: 23 U/L (ref 0–44)
AST: 26 U/L (ref 15–41)
Albumin: 3.4 g/dL — ABNORMAL LOW (ref 3.5–5.0)
Alkaline Phosphatase: 107 U/L (ref 38–126)
Anion gap: 10 (ref 5–15)
BUN: 7 mg/dL (ref 6–20)
CO2: 24 mmol/L (ref 22–32)
Calcium: 8.3 mg/dL — ABNORMAL LOW (ref 8.9–10.3)
Chloride: 102 mmol/L (ref 98–111)
Creatinine, Ser: 1.01 mg/dL — ABNORMAL HIGH (ref 0.44–1.00)
GFR, Estimated: >60 mL/min (ref >60)
Glucose, Bld: 162 mg/dL — ABNORMAL HIGH (ref 70–99)
Potassium: 2.8 mmol/L — ABNORMAL LOW (ref 3.5–5.1)
Sodium: 136 mmol/L (ref 135–145)
Total Bilirubin: 0.4 mg/dL (ref 0.3–1.2)
Total Protein: 6.8 g/dL (ref 6.5–8.1)

## 2021-10-30 MED ORDER — ONDANSETRON 4 MG PO TBDP
4.0000 mg | ORAL_TABLET | Freq: Once | ORAL | Status: AC
  Administered 2021-10-30: 4 mg via ORAL
  Filled 2021-10-30: qty 1

## 2021-10-30 MED ORDER — HYDROMORPHONE HCL 1 MG/ML IJ SOLN
0.5000 mg | Freq: Once | INTRAMUSCULAR | Status: AC
  Administered 2021-10-31: 0.5 mg via INTRAVENOUS
  Filled 2021-10-30: qty 1

## 2021-10-30 MED ORDER — CIPROFLOXACIN IN D5W 400 MG/200ML IV SOLN
400.0000 mg | Freq: Once | INTRAVENOUS | Status: AC
  Administered 2021-10-31: 400 mg via INTRAVENOUS
  Filled 2021-10-30: qty 200

## 2021-10-30 MED ORDER — LACTATED RINGERS IV BOLUS
1000.0000 mL | Freq: Once | INTRAVENOUS | Status: AC
  Administered 2021-10-31: 1000 mL via INTRAVENOUS

## 2021-10-30 MED ORDER — ONDANSETRON HCL 4 MG/2ML IJ SOLN
4.0000 mg | Freq: Once | INTRAMUSCULAR | Status: AC
  Administered 2021-10-31: 4 mg via INTRAVENOUS
  Filled 2021-10-30: qty 2

## 2021-10-30 MED ORDER — ACETAMINOPHEN 325 MG PO TABS
650.0000 mg | ORAL_TABLET | Freq: Four times a day (QID) | ORAL | Status: DC | PRN
  Administered 2021-10-30: 650 mg via ORAL
  Filled 2021-10-30: qty 2

## 2021-10-30 NOTE — ED Notes (Signed)
Triage RN notified about patient temperature

## 2021-10-30 NOTE — ED Provider Notes (Incomplete)
[facility] EMERGENCY DEPARTMENT Provider Note   CSN: [id number] Arrival date & time: 10/30/21  1348     History {Add pertinent medical, surgical, social history, OB history to HPI:1} Chief Complaint  Patient presents with  . Flank Pain    [name] is a 51 y.o. female.  51 yo F here with about a week of left flank pain, malodorous dark urine and just not feeling well. Has h/o kidney stones, thought that might be the problem but had a family death so was not able to be seen.    Flank Pain       Home Medications Prior to Admission medications   Medication Sig Start Date End Date Taking? Authorizing Provider  acetaminophen (TYLENOL) 500 MG tablet Take 1,000 mg by mouth every 6 (six) hours as needed for mild pain or headache.    [provider]  albuterol (PROVENTIL HFA;VENTOLIN HFA) 108 (90 BASE) MCG/ACT inhaler Inhale 2 puffs into the lungs every 6 (six) hours as needed for wheezing or shortness of breath.     [provider]  albuterol (PROVENTIL) (2.5 MG/3ML) 0.083% nebulizer solution Take 3 mLs (2.5 mg total) by nebulization every 6 (six) hours as needed for wheezing or shortness of breath. 05/16/19   Harvest Dark, MD  amitriptyline (ELAVIL) 10 MG tablet Take 50 mg by mouth at bedtime. 11/30/17   [provider]  b complex vitamins capsule Take 1 capsule by mouth daily. 11/05/17   Hillary Bow, MD  busPIRone (BUSPAR) 5 MG tablet Take 1 tablet by mouth 2 (two) times daily. 06/02/21 06/02/22  [provider]  carbamazepine (CARBATROL) 100 MG 12 hr capsule Take 1 po q HS X 1 wk, then 2 po q HS X 1 wk, then 3 po q HS 05/05/21   [provider]  EPINEPHrine 0.3 mg/0.3 mL IJ SOAJ injection Inject into the muscle.    [provider]  Erenumab-aooe (AIMOVIG) 140 MG/ML SOAJ Inject into the skin. 05/05/21   [provider]  ketorolac (TORADOL) 10 MG tablet Take 1 tablet (10 mg total) by mouth every 6  (six) hours as needed. 06/14/21   Billey Co, MD  levothyroxine (SYNTHROID) 88 MCG tablet Take 1 tablet by mouth 30-60 mins before breakfast 02/15/21   [provider]  lisdexamfetamine (VYVANSE) 30 MG capsule Take by mouth. 08/26/19 06/29/21  [provider]  magnesium oxide (MAG-OX) 400 MG tablet Take 400 mg by mouth daily.    [provider]  Nebulizers (COMPRESSOR/NEBULIZER) MISC Please use nebulizer machine to administer nebulizer treatments as prescribed. 05/16/19   Harvest Dark, MD  NON FORMULARY Use CBD oil    [provider]  ondansetron (ZOFRAN-ODT) 4 MG disintegrating tablet Take 1 tablet (4 mg total) by mouth every 8 (eight) hours as needed for nausea or vomiting. 06/09/21   Merlyn Lot, MD  oxyCODONE-acetaminophen (PERCOCET) 5-325 MG tablet Take 1 tablet by mouth every 4 (four) hours as needed for severe pain. 06/09/21 06/09/22  Merlyn Lot, MD  pantoprazole (PROTONIX) 40 MG tablet Take 1 tablet by mouth 2 (two) times daily before a meal. 08/12/19   [provider]  PARoxetine (PAXIL) 10 MG tablet Take 10 mg by mouth daily.    [provider]  pregabalin (LYRICA) 75 MG capsule Take 1 capsule by mouth 3 (three) times daily. 08/27/19   [provider]  promethazine (PHENERGAN) 25 MG tablet Take 1 tablet by mouth every 6 (six) hours as needed.  04/19/21   [provider]  rizatriptan (MAXALT) 10 MG tablet Take 1 tab by mouth at the onset of migraine, may repeat in 2 hrs if needed. max 3/day & 2-3 days/wk 04/19/21   [provider]  tamsulosin (FLOMAX) 0.4 MG CAPS capsule Take 1 capsule (0.4 mg total) by mouth daily after supper. 06/09/21   Merlyn Lot, MD  venlafaxine XR (EFFEXOR-XR) 37.5 MG 24 hr capsule Take 37.5 mg by mouth daily. 05/18/21   [provider]  venlafaxine XR (EFFEXOR-XR) 75 MG 24 hr capsule Take 75 mg by mouth daily. 05/18/21   [provider]      Allergies     Bee venom, Shellfish allergy, Iodine, Contrast media [iodinated contrast media], Other, Penicillins, Doxycycline, and Sulfa antibiotics    Review of Systems   Review of Systems  Genitourinary:  Positive for flank pain.    Physical Exam Updated Vital Signs BP 96/71 (BP Location: Left Arm)   Pulse (!) 104   Temp 99.8 F (37.7 C) (Oral)   Resp 18   LMP  (LMP Unknown)   SpO2 95%  Physical Exam  ED Results / Procedures / Treatments   Labs (all labs ordered are listed, but only abnormal results are displayed) Labs Reviewed  COMPREHENSIVE METABOLIC PANEL - Abnormal; Notable for the following components:      Result Value   Potassium 2.8 (*)    Glucose, Bld 162 (*)    Creatinine, Ser 1.01 (*)    Calcium 8.3 (*)    Albumin 3.4 (*)    All other components within normal limits  CBC WITH DIFFERENTIAL/PLATELET - Abnormal; Notable for the following components:   WBC 11.6 (*)    Neutro Abs 9.6 (*)    All other components within normal limits  URINALYSIS, ROUTINE W REFLEX MICROSCOPIC - Abnormal; Notable for the following components:   Color, Urine AMBER (*)    APPearance CLOUDY (*)    Glucose, UA 50 (*)    Hgb urine dipstick MODERATE (*)    Protein, ur 100 (*)    Leukocytes,Ua MODERATE (*)    WBC, UA >50 (*)    Bacteria, UA FEW (*)    Non Squamous Epithelial 0-5 (*)    All other components within normal limits  URINE CULTURE    EKG None  Radiology CT Renal Stone Study  Result Date: 10/30/2021 CLINICAL DATA:  Left flank pain for 1 week. Nausea. Chills. Dysuria. Nephrolithiasis. EXAM: CT ABDOMEN AND PELVIS WITHOUT CONTRAST TECHNIQUE: Multidetector CT imaging of the abdomen and pelvis was performed following the standard protocol without IV contrast. RADIATION DOSE REDUCTION: This exam was performed according to the departmental dose-optimization program which includes automated exposure control, adjustment of the mA and/or kV according to patient size and/or use of iterative  reconstruction technique. COMPARISON:  06/09/2021 FINDINGS: Lower chest: No acute findings. Hepatobiliary: Moderate diffuse hepatic steatosis is again demonstrated. No mass visualized on this unenhanced exam. Gallbladder is unremarkable. No evidence of biliary ductal dilatation. Pancreas: No mass or inflammatory process visualized on this unenhanced exam. Spleen: Within normal limits in size. Benign peripherally calcified cyst in the spleen measuring 3 cm is stable. Adrenals/Urinary tract: A few tiny less than 5 mm left renal calculi are again seen. No evidence of ureteral calculi or hydronephrosis. A 2.6 cm subcapsular lesion is seen in the posterior lower pole of the right kidney which shows slight increase in size and has higher than fluid attenuation. This could represent a proteinaceous cyst  or solid renal mass cannot be characterized on this noncontrast exam. Urinary bladder is empty. Stomach/Bowel: No evidence of obstruction, inflammatory process, or abnormal fluid collections. Vascular/Lymphatic: No pathologically enlarged lymph nodes identified. No evidence of abdominal aortic aneurysm. Reproductive: Prior hysterectomy noted. Adnexal regions are unremarkable in appearance. Other:  None. Musculoskeletal:  No suspicious bone lesions identified. IMPRESSION: Tiny nonobstructing left renal calculi. No evidence of ureteral calculi or hydronephrosis. Slight increase in size of indeterminate subcapsular lesion in lower pole of right kidney, which could represent a proteinaceous cyst or solid renal mass. Nonemergent outpatient abdomen MRI without and with contrast is recommended for further characterization. Moderate hepatic steatosis. Electronically Signed   By: Marlaine Hind M.D.   On: 10/30/2021 16:52    Procedures Procedures  {Document cardiac monitor, telemetry assessment procedure when appropriate:1}  Medications Ordered in ED Medications  acetaminophen (TYLENOL) tablet 650 mg (650 mg Oral Given 10/30/21  2135)  ciprofloxacin (CIPRO) IVPB 400 mg (has no administration in time range)  lactated ringers bolus 1,000 mL (has no administration in time range)  ondansetron (ZOFRAN) injection 4 mg (has no administration in time range)  HYDROmorphone (DILAUDID) injection 0.5 mg (has no administration in time range)  ondansetron (ZOFRAN-ODT) disintegrating tablet 4 mg (4 mg Oral Given 10/30/21 1557)    ED Course/ Medical Decision Making/ A&P                           Medical Decision Making Risk Prescription drug management.   ***  {Document critical care time when appropriate:1} {Document review of labs and clinical decision tools ie heart score, Chads2Vasc2 etc:1}  {Document your independent review of radiology images, and any outside records:1} {Document your discussion with family members, caretakers, and with consultants:1} {Document social determinants of health affecting pt's care:1} {Document your decision making why or why not admission, treatments were needed:1} Final Clinical Impression(s) / ED Diagnoses Final diagnoses:  None    Rx / DC Orders ED Discharge Orders     None

## 2021-10-30 NOTE — ED Provider Triage Note (Signed)
Emergency Medicine Provider Triage Evaluation Note  [name] , a 50 y.o. female  was evaluated in triage.  Pt complains of intermittent, sharp left flank pain. States it has been present for 1 week but was unable to be seen because her mother died. Has associated nausea without vomiting. Endorses foul urine and dysuria. Denies fever but endorses chills   Review of Systems  Positive:  Negative:   Physical Exam  BP 102/73 (BP Location: Right Arm)   Pulse (!) 107   Temp 98.8 F (37.1 C) (Oral)   Resp 20   LMP  (LMP Unknown)   SpO2 98%  Gen:   Awake, no distress   Resp:  Normal effort  MSK:   Moves extremities without difficulty  Other:  CVAT+ left side   Medical Decision Making  Medically screening exam initiated at 3:49 PM.  Appropriate orders placed.  [name] was informed that the remainder of the evaluation will be completed by another provider, this initial triage assessment does not replace that evaluation, and the importance of remaining in the ED until their evaluation is complete.     Emily Hillier, PA-C 10/30/21 1550

## 2021-10-30 NOTE — ED Triage Notes (Signed)
Pt here for L flank pain x1 week, pt reports nausea and chills, denies vomiting/diarrhea. Pt reports dysuria and strong odor to urine x1 week. Hx of kidney stones 6 months ago.

## 2021-10-30 NOTE — ED Provider Notes (Signed)
[facility] Provider Note   CSN: [id number] Arrival date & time: 10/30/21  1348     History  Chief Complaint  Patient presents with   Flank Pain    [name] is a 51 y.o. female.  51 yo F here with about a week of left flank pain, malodorous dark urine and just not feeling well. Has h/o kidney stones, thought that might be the problem but had a family death so was not able to be seen. Denies fevers at home, just malaise and nausea.    Flank Pain       Home Medications Prior to Admission medications   Medication Sig Start Date End Date Taking? Authorizing Provider  ciprofloxacin (CIPRO) 500 MG tablet Take 1 tablet (500 mg total) by mouth 2 (two) times daily. 10/31/21  Yes Krista Som, Corene Cornea, MD  ondansetron (ZOFRAN) 8 MG tablet Take 1 tablet (8 mg total) by mouth every 4 (four) hours as needed for nausea. 10/31/21  Yes Ji Fairburn, Corene Cornea, MD  oxyCODONE-acetaminophen (PERCOCET) 5-325 MG tablet Take 1-2 tablets by mouth every 6 (six) hours as needed for severe pain. 10/31/21  Yes Jessi Jessop, Corene Cornea, MD  acetaminophen (TYLENOL) 500 MG tablet Take 1,000 mg by mouth every 6 (six) hours as needed for mild pain or headache.    [provider]  albuterol (PROVENTIL HFA;VENTOLIN HFA) 108 (90 BASE) MCG/ACT inhaler Inhale 2 puffs into the lungs every 6 (six) hours as needed for wheezing or shortness of breath.     [provider]  albuterol (PROVENTIL) (2.5 MG/3ML) 0.083% nebulizer solution Take 3 mLs (2.5 mg total) by nebulization every 6 (six) hours as needed for wheezing or shortness of breath. 05/16/19   Harvest Dark, MD  amitriptyline (ELAVIL) 10 MG tablet Take 50 mg by mouth at bedtime. 11/30/17   [provider]  b complex vitamins capsule Take 1 capsule by mouth daily. 11/05/17   Hillary Bow, MD  busPIRone (BUSPAR) 5 MG tablet Take 1 tablet by mouth 2 (two) times daily. 06/02/21 06/02/22  [provider]   carbamazepine (CARBATROL) 100 MG 12 hr capsule Take 1 po q HS X 1 wk, then 2 po q HS X 1 wk, then 3 po q HS 05/05/21   [provider]  EPINEPHrine 0.3 mg/0.3 mL IJ SOAJ injection Inject into the muscle.    [provider]  Erenumab-aooe (AIMOVIG) 140 MG/ML SOAJ Inject into the skin. 05/05/21   [provider]  ketorolac (TORADOL) 10 MG tablet Take 1 tablet (10 mg total) by mouth every 6 (six) hours as needed. 06/14/21   Billey Co, MD  levothyroxine (SYNTHROID) 88 MCG tablet Take 1 tablet by mouth 30-60 mins before breakfast 02/15/21   [provider]  lisdexamfetamine (VYVANSE) 30 MG capsule Take by mouth. 08/26/19 06/29/21  [provider]  magnesium oxide (MAG-OX) 400 MG tablet Take 400 mg by mouth daily.    [provider]  Nebulizers (COMPRESSOR/NEBULIZER) MISC Please use nebulizer machine to administer nebulizer treatments as prescribed. 05/16/19   Harvest Dark, MD  NON FORMULARY Use CBD oil    [provider]  pantoprazole (PROTONIX) 40 MG tablet Take 1 tablet by mouth 2 (two) times daily before a meal. 08/12/19   [provider]  PARoxetine (PAXIL) 10 MG tablet Take 10 mg by mouth daily.    [provider]  pregabalin (LYRICA) 75 MG capsule Take 1 capsule by mouth 3 (three) times daily. 08/27/19  [provider]  promethazine (PHENERGAN) 25 MG tablet Take 1 tablet by mouth every 6 (six) hours as needed. 04/19/21   [provider]  rizatriptan (MAXALT) 10 MG tablet Take 1 tab by mouth at the onset of migraine, may repeat in 2 hrs if needed. max 3/day & 2-3 days/wk 04/19/21   [provider]  tamsulosin (FLOMAX) 0.4 MG CAPS capsule Take 1 capsule (0.4 mg total) by mouth daily after supper. 06/09/21   Merlyn Lot, MD  venlafaxine XR (EFFEXOR-XR) 37.5 MG 24 hr capsule Take 37.5 mg by mouth daily. 05/18/21   [provider]  venlafaxine XR (EFFEXOR-XR) 75 MG 24 hr capsule Take  75 mg by mouth daily. 05/18/21   [provider]      Allergies    Bee venom, Shellfish allergy, Iodine, Contrast media [iodinated contrast media], Other, Penicillins, Doxycycline, and Sulfa antibiotics    Review of Systems   Review of Systems  Genitourinary:  Positive for flank pain.    Physical Exam Updated Vital Signs BP 92/60 (BP Location: Right Arm)   Pulse 80   Temp 97.7 F (36.5 C) (Oral)   Resp 17   LMP  (LMP Unknown)   SpO2 97%  Physical Exam Vitals and nursing note reviewed.  Constitutional:      Appearance: She is well-developed.  HENT:     Head: Normocephalic and atraumatic.  Eyes:     Pupils: Pupils are equal, round, and reactive to light.  Cardiovascular:     Rate and Rhythm: Regular rhythm. Tachycardia present.  Pulmonary:     Effort: No respiratory distress.     Breath sounds: No stridor.  Abdominal:     General: There is no distension.  Musculoskeletal:        General: No swelling or tenderness.     Cervical back: Normal range of motion.  Skin:    General: Skin is warm and dry.  Neurological:     General: No focal deficit present.     Mental Status: She is alert.     ED Results / Procedures / Treatments   Labs (all labs ordered are listed, but only abnormal results are displayed) Labs Reviewed  COMPREHENSIVE METABOLIC PANEL - Abnormal; Notable for the following components:      Result Value   Potassium 2.8 (*)    Glucose, Bld 162 (*)    Creatinine, Ser 1.01 (*)    Calcium 8.3 (*)    Albumin 3.4 (*)    All other components within normal limits  CBC WITH DIFFERENTIAL/PLATELET - Abnormal; Notable for the following components:   WBC 11.6 (*)    Neutro Abs 9.6 (*)    All other components within normal limits  URINALYSIS, ROUTINE W REFLEX MICROSCOPIC - Abnormal; Notable for the following components:   Color, Urine AMBER (*)    APPearance CLOUDY (*)    Glucose, UA 50 (*)    Hgb urine dipstick MODERATE (*)    Protein, ur 100 (*)     Leukocytes,Ua MODERATE (*)    WBC, UA >50 (*)    Bacteria, UA FEW (*)    Non Squamous Epithelial 0-5 (*)    All other components within normal limits  URINE CULTURE    EKG None  Radiology CT Renal Stone Study  Result Date: 10/30/2021 CLINICAL DATA:  Left flank pain for 1 week. Nausea. Chills. Dysuria. Nephrolithiasis. EXAM: CT ABDOMEN AND PELVIS WITHOUT CONTRAST TECHNIQUE: Multidetector CT imaging of the abdomen and pelvis was  performed following the standard protocol without IV contrast. RADIATION DOSE REDUCTION: This exam was performed according to the departmental dose-optimization program which includes automated exposure control, adjustment of the mA and/or kV according to patient size and/or use of iterative reconstruction technique. COMPARISON:  06/09/2021 FINDINGS: Lower chest: No acute findings. Hepatobiliary: Moderate diffuse hepatic steatosis is again demonstrated. No mass visualized on this unenhanced exam. Gallbladder is unremarkable. No evidence of biliary ductal dilatation. Pancreas: No mass or inflammatory process visualized on this unenhanced exam. Spleen: Within normal limits in size. Benign peripherally calcified cyst in the spleen measuring 3 cm is stable. Adrenals/Urinary tract: A few tiny less than 5 mm left renal calculi are again seen. No evidence of ureteral calculi or hydronephrosis. A 2.6 cm subcapsular lesion is seen in the posterior lower pole of the right kidney which shows slight increase in size and has higher than fluid attenuation. This could represent a proteinaceous cyst or solid renal mass cannot be characterized on this noncontrast exam. Urinary bladder is empty. Stomach/Bowel: No evidence of obstruction, inflammatory process, or abnormal fluid collections. Vascular/Lymphatic: No pathologically enlarged lymph nodes identified. No evidence of abdominal aortic aneurysm. Reproductive: Prior hysterectomy noted. Adnexal regions are unremarkable in appearance. Other:   None. Musculoskeletal:  No suspicious bone lesions identified. IMPRESSION: Tiny nonobstructing left renal calculi. No evidence of ureteral calculi or hydronephrosis. Slight increase in size of indeterminate subcapsular lesion in lower pole of right kidney, which could represent a proteinaceous cyst or solid renal mass. Nonemergent outpatient abdomen MRI without and with contrast is recommended for further characterization. Moderate hepatic steatosis. Electronically Signed   By: Marlaine Hind M.D.   On: 10/30/2021 16:52    Procedures Procedures    Medications Ordered in ED Medications  acetaminophen (TYLENOL) tablet 650 mg (650 mg Oral Given 10/30/21 2135)  oxyCODONE-acetaminophen (PERCOCET/ROXICET) 5-325 MG per tablet 1 tablet (has no administration in time range)  ondansetron (ZOFRAN-ODT) disintegrating tablet 4 mg (4 mg Oral Given 10/30/21 1557)  ciprofloxacin (CIPRO) IVPB 400 mg (400 mg Intravenous New Bag/Given 10/31/21 0031)  lactated ringers bolus 1,000 mL (1,000 mLs Intravenous New Bag/Given 10/31/21 0032)  ondansetron (ZOFRAN) injection 4 mg (4 mg Intravenous Given 10/31/21 0023)  HYDROmorphone (DILAUDID) injection 0.5 mg (0.5 mg Intravenous Given 10/31/21 0024)    ED Course/ Medical Decision Making/ A&P                           Medical Decision Making Risk Prescription drug management.   Likely pyelo, does have some stones in the kidney but no obstructive disease. 2/2 fever/wbc will treat as pyelo - IV abx, pain meds and fluids improved VS and symptoms. Will continue meds at home with PCP follow up in 5 days for recheck, here if not continuing to improve or worsening.   Final Clinical Impression(s) / ED Diagnoses Final diagnoses:  Pyelonephritis    Rx / DC Orders ED Discharge Orders          Ordered    oxyCODONE-acetaminophen (PERCOCET) 5-325 MG tablet  Every 6 hours PRN        10/31/21 0231    ondansetron (ZOFRAN) 8 MG tablet  Every 4 hours PRN        10/31/21 0231     ciprofloxacin (CIPRO) 500 MG tablet  2 times daily        10/31/21 0231              Darriana Deboy, Corene Cornea, MD  10/31/21 0234  

## 2021-10-31 DIAGNOSIS — N12 Tubulo-interstitial nephritis, not specified as acute or chronic: Secondary | ICD-10-CM | POA: Diagnosis not present

## 2021-10-31 MED ORDER — ONDANSETRON HCL 8 MG PO TABS: 8.0000 mg | ORAL_TABLET | ORAL | 0 refills | Status: AC | PRN

## 2021-10-31 MED ORDER — OXYCODONE-ACETAMINOPHEN 5-325 MG PO TABS: 1.0000 | ORAL_TABLET | Freq: Four times a day (QID) | ORAL | 0 refills | Status: AC | PRN

## 2021-10-31 MED ORDER — CIPROFLOXACIN HCL 500 MG PO TABS: 500.0000 mg | ORAL_TABLET | Freq: Two times a day (BID) | ORAL | 0 refills | Status: AC

## 2021-10-31 MED ORDER — OXYCODONE-ACETAMINOPHEN 5-325 MG PO TABS
1.0000 | ORAL_TABLET | Freq: Once | ORAL | Status: AC
  Administered 2021-10-31: 1 via ORAL
  Filled 2021-10-31: qty 1

## 2021-10-31 NOTE — ED Notes (Signed)
Discharge instructions reviewed with patient. Patient verbalized understanding of instructions. Follow-up care and medications were reviewed. Patient ambulatory with steady gait. VSS upon discharge.  ?

## 2021-11-09 ENCOUNTER — Other Ambulatory Visit: Payer: Self-pay | Admitting: Family Medicine

## 2021-11-09 DIAGNOSIS — N10 Acute tubulo-interstitial nephritis: Secondary | ICD-10-CM

## 2021-11-09 DIAGNOSIS — N368 Other specified disorders of urethra: Secondary | ICD-10-CM

## 2021-11-09 DIAGNOSIS — N2889 Other specified disorders of kidney and ureter: Secondary | ICD-10-CM

## 2021-12-13 ENCOUNTER — Ambulatory Visit: Payer: 59 | Admitting: Urology

## 2022-01-11 ENCOUNTER — Emergency Department
Admission: EM | Admit: 2022-01-11 | Discharge: 2022-01-12 | Disposition: A | Discharge status: Home / Self Care | Payer: Medicare Other | Attending: Emergency Medicine | Admitting: Emergency Medicine

## 2022-01-11 ENCOUNTER — Emergency Department: Payer: Medicare Other

## 2022-01-11 ENCOUNTER — Encounter: Payer: Self-pay | Admitting: Emergency Medicine

## 2022-01-11 ENCOUNTER — Other Ambulatory Visit: Payer: Self-pay

## 2022-01-11 DIAGNOSIS — M5431 Sciatica, right side: Secondary | ICD-10-CM

## 2022-01-11 DIAGNOSIS — M5441 Lumbago with sciatica, right side: Secondary | ICD-10-CM | POA: Insufficient documentation

## 2022-01-11 DIAGNOSIS — M549 Dorsalgia, unspecified: Secondary | ICD-10-CM | POA: Diagnosis present

## 2022-01-11 MED ORDER — MELOXICAM 15 MG PO TABS: 15.0000 mg | ORAL_TABLET | Freq: Every day | ORAL | 0 refills | Status: AC

## 2022-01-11 MED ORDER — METHOCARBAMOL 500 MG PO TABS
1000.0000 mg | ORAL_TABLET | Freq: Once | ORAL | Status: AC
  Administered 2022-01-12: 1000 mg via ORAL
  Filled 2022-01-11: qty 2

## 2022-01-11 MED ORDER — KETOROLAC TROMETHAMINE 30 MG/ML IJ SOLN
30.0000 mg | Freq: Once | INTRAMUSCULAR | Status: AC
  Administered 2022-01-12: 30 mg via INTRAMUSCULAR
  Filled 2022-01-11: qty 1

## 2022-01-11 MED ORDER — METHOCARBAMOL 500 MG PO TABS: 500.0000 mg | ORAL_TABLET | Freq: Four times a day (QID) | ORAL | 0 refills | Status: AC

## 2022-01-11 NOTE — ED Provider Notes (Signed)
[facility] Provider Note  Patient Contact: 10:33 PM (approximate)   History   Back Pain   HPI  [name] is a 51 y.o. female who presents the emergency department with acute worsening of chronic back pain.  Patient has a long history of chronic back pain.  States that she is started a new job where she lives, twist and moves items frequently.  Patient does have pain radiating down the right leg.  There is no bowel or bladder dysfunction, saddle anesthesia or paresthesias.  No urinary complaints.  No GI complaints.     Physical Exam   Triage Vital Signs: ED Triage Vitals  Enc Vitals Group     BP 01/11/22 2008 105/80     Pulse --      Resp 01/11/22 2008 18     Temp 01/11/22 2008 98.8 F (37.1 C)     Temp Source 01/11/22 2008 Oral     SpO2 01/11/22 2008 99 %     Weight --      Height --      Head Circumference --      Peak Flow --      Pain Score 01/11/22 2006 10     Pain Loc --      Pain Edu? --      Excl. in [place]? --     Most recent vital signs: Vitals:   01/11/22 2008  BP: 105/80  Resp: 18  Temp: 98.8 F (37.1 C)  SpO2: 99%     General: Alert and in no acute distress.  Cardiovascular:  Good peripheral perfusion Respiratory: Normal respiratory effort without tachypnea or retractions. Lungs CTAB.  Gastrointestinal: Bowel sounds 4 quadrants. Soft and nontender to palpation. No guarding or rigidity. No palpable masses. No distention. No CVA tenderness. Musculoskeletal: Full range of motion to all extremities.  No visible signs of trauma to the lumbar spine.  Palpation along the midline and left paraspinal muscle is reassuring without tenderness.  Patient is diffusely tender along the right paraspinal muscle group extending into the SI joint and sciatic notch.  Negative straight leg raise.  Dorsalis pedis pulses sensation intact equal bilateral lower extremities. Neurologic:  No gross focal neurologic deficits are appreciated.  Skin:    No rash noted Other:   ED Results / Procedures / Treatments   Labs (all labs ordered are listed, but only abnormal results are displayed) Labs Reviewed - No data to display   EKG     RADIOLOGY  I personally viewed, evaluated, and interpreted these images as part of my medical decision making, as well as reviewing the written report by the radiologist.  ED Provider Interpretation: Degenerative changes but no acute abnormality on x-ray  DG Lumbar Spine 2-3 Views  Result Date: 01/11/2022 CLINICAL DATA:  Low back pain, nontraumatic. EXAM: LUMBAR SPINE - 2-3 VIEW COMPARISON:  None Available. FINDINGS: There is no evidence of lumbar spine fracture. Alignment is normal. Multilevel degenerate disc disease with disc height loss and marginal osteophytes. Mild facet joint arthropathy at L5-S1. IMPRESSION: 1. No acute fracture or malalignment. 2. Mild multilevel degenerative disc disease. Electronically Signed   By: Keane Police D.O.   On: 01/11/2022 22:57    PROCEDURES:  Critical Care performed: No  Procedures   MEDICATIONS ORDERED IN ED: Medications  ketorolac (TORADOL) 30 MG/ML injection 30 mg (has no administration in time range)  methocarbamol (ROBAXIN) tablet 1,000 mg (has no administration in time range)  IMPRESSION / MDM / ASSESSMENT AND PLAN / ED COURSE  I reviewed the triage vital signs and the nursing notes.                              Differential diagnosis includes, but is not limited to, lumbago, sciatica, compression fracture, herniated disc  Patient's presentation is most consistent with acute presentation with potential threat to life or bodily function.   Patient's diagnosis is consistent with sciatica.  Patient presents emergency department lower back pain radiating to right leg.  Patient has no history of trauma.  No urinary or GI complaints.  No concerning neuro deficits.  X-ray reveals some mild degenerative changes without acute finding..  Patient will  be prescribed symptom control medication of anti-inflammatories and muscle relaxer.  Follow-up primary care as needed.  Patient is given ED precautions to return to the ED for any worsening or new symptoms.        FINAL CLINICAL IMPRESSION(S) / ED DIAGNOSES   Final diagnoses:  Sciatica of right side     Rx / DC Orders   ED Discharge Orders          Ordered    meloxicam (MOBIC) 15 MG tablet  Daily        01/11/22 2349    methocarbamol (ROBAXIN) 500 MG tablet  4 times daily        01/11/22 2349             Note:  This document was prepared using Dragon voice recognition software and may include unintentional dictation errors.   Darletta Moll, PA-C 01/11/22 2350    Rada Hay, MD 01/16/22 [phone]

## 2022-01-11 NOTE — ED Triage Notes (Addendum)
Pt with lumbar pain radiating to right buttock and right leg.  States has had this in the past and it resolved but this time it is getting worse.  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

## 2022-01-12 DIAGNOSIS — M5441 Lumbago with sciatica, right side: Secondary | ICD-10-CM | POA: Diagnosis not present

## 2022-02-04 ENCOUNTER — Emergency Department: Payer: Medicare Other

## 2022-02-04 ENCOUNTER — Other Ambulatory Visit: Payer: Self-pay

## 2022-02-04 ENCOUNTER — Emergency Department
Admission: EM | Admit: 2022-02-04 | Discharge: 2022-02-04 | Disposition: A | Discharge status: Home / Self Care | Payer: Medicare Other | Attending: Emergency Medicine | Admitting: Emergency Medicine

## 2022-02-04 DIAGNOSIS — R5383 Other fatigue: Secondary | ICD-10-CM | POA: Diagnosis not present

## 2022-02-04 DIAGNOSIS — R0789 Other chest pain: Secondary | ICD-10-CM

## 2022-02-04 DIAGNOSIS — M25522 Pain in left elbow: Secondary | ICD-10-CM | POA: Diagnosis not present

## 2022-02-04 DIAGNOSIS — R0602 Shortness of breath: Secondary | ICD-10-CM | POA: Insufficient documentation

## 2022-02-04 DIAGNOSIS — Z1152 Encounter for screening for COVID-19: Secondary | ICD-10-CM | POA: Diagnosis not present

## 2022-02-04 LAB — COMPREHENSIVE METABOLIC PANEL
ALT: 24 U/L (ref 0–44)
AST: 37 U/L (ref 15–41)
Albumin: 4.9 g/dL (ref 3.5–5.0)
Alkaline Phosphatase: 144 U/L — ABNORMAL HIGH (ref 38–126)
Anion gap: 8 (ref 5–15)
BUN: 8 mg/dL (ref 6–20)
CO2: 25 mmol/L (ref 22–32)
Calcium: 9.4 mg/dL (ref 8.9–10.3)
Chloride: 107 mmol/L (ref 98–111)
Creatinine, Ser: 0.67 mg/dL (ref 0.44–1.00)
GFR, Estimated: >60 mL/min (ref >60)
Glucose, Bld: 117 mg/dL — ABNORMAL HIGH (ref 70–99)
Potassium: 3.3 mmol/L — ABNORMAL LOW (ref 3.5–5.1)
Sodium: 140 mmol/L (ref 135–145)
Total Bilirubin: 0.8 mg/dL (ref 0.3–1.2)
Total Protein: 8.7 g/dL — ABNORMAL HIGH (ref 6.5–8.1)

## 2022-02-04 LAB — TROPONIN I (HIGH SENSITIVITY)
Troponin I (High Sensitivity): <2 ng/L (ref <18)
Troponin I (High Sensitivity): <2 ng/L (ref <18)

## 2022-02-04 LAB — CBC
HCT: 44.6 % (ref 36.0–46.0)
Hemoglobin: 14.6 g/dL (ref 12.0–15.0)
MCH: 28.5 pg (ref 26.0–34.0)
MCHC: 32.7 g/dL (ref 30.0–36.0)
MCV: 86.9 fL (ref 80.0–100.0)
Platelets: 194 10*3/uL (ref 150–400)
RBC: 5.13 MIL/uL — ABNORMAL HIGH (ref 3.87–5.11)
RDW: 13.4 % (ref 11.5–15.5)
WBC: 7.4 10*3/uL (ref 4.0–10.5)
nRBC: 0 % (ref 0.0–0.2)

## 2022-02-04 LAB — RESP PANEL BY RT-PCR (FLU A&B, COVID) ARPGX2
Influenza A by PCR: NEGATIVE
Influenza B by PCR: NEGATIVE
SARS Coronavirus 2 by RT PCR: NEGATIVE

## 2022-02-04 LAB — T4, FREE: Free T4: 0.88 ng/dL (ref 0.61–1.12)

## 2022-02-04 LAB — D-DIMER, QUANTITATIVE: D-Dimer, Quant: 0.27 ug/mL-FEU (ref 0.00–0.50)

## 2022-02-04 LAB — TSH: TSH: 1.443 u[IU]/mL (ref 0.350–4.500)

## 2022-02-04 MED ORDER — LIDOCAINE 5 % EX PTCH
1.0000 | MEDICATED_PATCH | Freq: Once | CUTANEOUS | Status: DC
  Administered 2022-02-04: 1 via TRANSDERMAL
  Filled 2022-02-04: qty 1

## 2022-02-04 MED ORDER — KETOROLAC TROMETHAMINE 30 MG/ML IJ SOLN
30.0000 mg | Freq: Once | INTRAMUSCULAR | Status: AC
  Administered 2022-02-04: 30 mg via INTRAMUSCULAR
  Filled 2022-02-04: qty 1

## 2022-02-04 MED ORDER — LIDOCAINE 5 % EX PTCH: 1.0000 | MEDICATED_PATCH | Freq: Two times a day (BID) | CUTANEOUS | 0 refills | Status: AC

## 2022-02-04 MED ORDER — ACETAMINOPHEN 500 MG PO TABS
1000.0000 mg | ORAL_TABLET | Freq: Once | ORAL | Status: AC
  Administered 2022-02-04: 1000 mg via ORAL
  Filled 2022-02-04: qty 2

## 2022-02-04 NOTE — ED Provider Notes (Addendum)
[facility] Provider Note    Event Date/Time   First MD Initiated Contact with Patient 02/04/22 1715     (approximate)   History   Chest Pain   HPI  [name] is a 51 y.o. female who comes in with chest pain on the left side down to her elbow as well as pain with breathing.  Patient reports her symptoms are today around 11 AM.  She denies a history of heart attack or blood clots but does report that she does vape but denies any cough or fevers.  She does report feeling just more fatigued recently as well.  Denies any abdominal pain, leg swelling.  Denies any falls or hitting her head.  Patient does report a history of trigeminal neuralgia status post surgery.  Physical Exam   Triage Vital Signs: ED Triage Vitals  Enc Vitals Group     BP 02/04/22 1546 139/89     Pulse Rate 02/04/22 1546 75     Resp 02/04/22 1546 20     Temp 02/04/22 1546 98 F (36.7 C)     Temp Source 02/04/22 1546 Oral     SpO2 02/04/22 1546 100 %     Weight 02/04/22 1543 170 lb (77.1 kg)     Height 02/04/22 1543 _0  (1.6 m)     Head Circumference --      Peak Flow --      Pain Score 02/04/22 1543 7     Pain Loc --      Pain Edu? --      Excl. in [place]? --     Most recent vital signs: Vitals:   02/04/22 1546  BP: 139/89  Pulse: 75  Resp: 20  Temp: 98 F (36.7 C)  SpO2: 100%     General: Awake, no distress.  CV:  Good peripheral perfusion.  Resp:  Normal effort.  Abd:  No distention.  Other:  Soft and nontender.  Clear lungs. Equal radial pulses 2+.  No redness or warmth of the shoulder or the elbow.  No swelling of the arm.  ED Results / Procedures / Treatments   Labs (all labs ordered are listed, but only abnormal results are displayed) Labs Reviewed  CBC - Abnormal; Notable for the following components:      Result Value   RBC 5.13 (*)    All other components within normal limits  COMPREHENSIVE METABOLIC PANEL - Abnormal; Notable for the following  components:   Potassium 3.3 (*)    Glucose, Bld 117 (*)    Total Protein 8.7 (*)    Alkaline Phosphatase 144 (*)    All other components within normal limits  RESP PANEL BY RT-PCR (FLU A&B, COVID) ARPGX2  TSH  T4, FREE  D-DIMER, QUANTITATIVE  TROPONIN I (HIGH SENSITIVITY)  TROPONIN I (HIGH SENSITIVITY)     EKG  My interpretation of EKG:  Nromal sinsu rate of 73, no st elevation, no twi, normal intervals   RADIOLOGY I have reviewed the xray personally and interpretted and no PNA    PROCEDURES:  Critical Care performed: No  Procedures   MEDICATIONS ORDERED IN ED: Medications  lidocaine (LIDODERM) 5 % 1 patch (1 patch Transdermal Patch Applied 02/04/22 1815)  acetaminophen (TYLENOL) tablet 1,000 mg (1,000 mg Oral Given 02/04/22 1812)  ketorolac (TORADOL) 30 MG/ML injection 30 mg (30 mg Intramuscular Given 02/04/22 1817)     IMPRESSION / MDM / ASSESSMENT AND PLAN / ED COURSE  I  reviewed the triage vital signs and the nursing notes.   Patient's presentation is most consistent with acute presentation with potential threat to life or bodily function.   Patient comes in with chest pain.  Differential is pneumonia, pneumothorax, PE, ACS, msk.  Patient was given some Tylenol, lidocaine patches, Toradol to help with pain.  No wheezing or symptoms to suggest COPD exacerbation.  Does not sound like a dissection.  Slightly low potassium we will give some oral repletion.  Alk phos slightly elevated but similar to prior troponin is negative.  CBC is reassuring.  COVID test is negative.  Troponins are negative x 2.  Thyroid reassuring.  D-dimer negative.  Consider admission given negative troponins and resolution of chest pain patient can follow-up outpatient  On repeat assessment patient reports resolution of pain.  She reports feeling at her baseline self.  Her workup was reassuring.  She does work as an Special educational needs teacher and wonder if some of this could be a pulled muscle.  She reports  that wanting a prescription for the lidocaine patches and using Tylenol, ibuprofen for pain.  She can return to the ER if she develops worsening symptoms or any other concerns.  Will refer her to cardiology given low heart score.     FINAL CLINICAL IMPRESSION(S) / ED DIAGNOSES   Final diagnoses:  Atypical chest pain     Rx / DC Orders   ED Discharge Orders          Ordered    Ambulatory referral to Cardiology        02/04/22 1930    lidocaine (LIDODERM) 5 %  Every 12 hours        02/04/22 1930             Note:  This document was prepared using Dragon voice recognition software and may include unintentional dictation errors.   Vanessa [place], MD 02/04/22 1931    Vanessa [place], MD 02/04/22 [phone]

## 2022-02-04 NOTE — Discharge Instructions (Signed)
Can take Tylenol 1 g every 8 hours and ibuprofen 600 every 8 hours with food.  I prescribed some lidocaine patches to help with the pain.  Suspect that is most likely a pulled muscle but I did refer you to cardiology if you continue to have chest discomfort you can always follow-up with them.  You should always return to the ER if you develop change in your symptoms or things that are worsening.

## 2022-02-04 NOTE — ED Triage Notes (Signed)
Pt states she was at work when she started having CP in her L side down into her elbow- pt states it also hurts in her neck and back- pt states it hurts to breath

## 2022-02-04 NOTE — ED Provider Triage Note (Signed)
Emergency Medicine Provider Triage Evaluation Note  [name], a 51 y.o. female  was evaluated in triage.  Pt complains of chest pain with onset about 1130 this morning.  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  She describes feeling short of breath, diaphoretic, and nauseated.  She reports symptoms persist including referral into her left arm and left jaw.  She denies any syncope, vision change, or vomiting.  Review of Systems  Positive: CP Negative: LOC  Physical Exam  BP 139/89 (BP Location: Left Arm)   Pulse 75   Temp 98 F (36.7 C) (Oral)   Resp 20   Ht '5\' 3"'$  (1.6 m)   Wt 77.1 kg   LMP  (LMP Unknown)   SpO2 100%   BMI 30.11 kg/m  Gen:   Awake, no distress  NAD Resp:  Normal effort CTA MSK:   Moves extremities without difficulty  Other:    Medical Decision Making  Medically screening exam initiated at 3:55 PM.  Appropriate orders placed.  [name] was informed that the remainder of the evaluation will be completed by another provider, this initial triage assessment does not replace that evaluation, and the importance of remaining in the ED until their evaluation is complete.  Patient to the ED for evaluation of left central chest pain with onset about 11 AM this morning.  She reports associated nausea, diaphoresis, and shortness of breath.   Melvenia Needles, PA-C 02/04/22 1558
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
